# Patient Record
Sex: Male | Born: 1935 | Race: White | Hispanic: No | Smoking: Former smoker
Health system: Southern US, Community
[De-identification: ages and names within clinical notes are randomized; demographics above are authoritative.]

## PROBLEM LIST (undated history)

## (undated) DIAGNOSIS — G4700 Insomnia, unspecified: Secondary | ICD-10-CM

## (undated) DIAGNOSIS — F17200 Nicotine dependence, unspecified, uncomplicated: Secondary | ICD-10-CM

## (undated) DIAGNOSIS — R972 Elevated prostate specific antigen [PSA]: Secondary | ICD-10-CM

## (undated) DIAGNOSIS — J984 Other disorders of lung: Secondary | ICD-10-CM

## (undated) DIAGNOSIS — E785 Hyperlipidemia, unspecified: Secondary | ICD-10-CM

## (undated) DIAGNOSIS — C3490 Malignant neoplasm of unspecified part of unspecified bronchus or lung: Secondary | ICD-10-CM

## (undated) DIAGNOSIS — I10 Essential (primary) hypertension: Secondary | ICD-10-CM

## (undated) HISTORY — PX: PROSTATE SURGERY: SHX751

## (undated) HISTORY — DX: Hyperlipidemia, unspecified: E78.5

## (undated) HISTORY — DX: Insomnia, unspecified: G47.00

## (undated) HISTORY — DX: Elevated prostate specific antigen (PSA): R97.20

## (undated) HISTORY — PX: APPENDECTOMY: SHX54

## (undated) HISTORY — PX: LOBECTOMY: SHX5089

## (undated) HISTORY — DX: Malignant neoplasm of unspecified part of unspecified bronchus or lung: C34.90

## (undated) HISTORY — DX: Essential (primary) hypertension: I10

## (undated) HISTORY — DX: Nicotine dependence, unspecified, uncomplicated: F17.200

## (undated) HISTORY — DX: Other disorders of lung: J98.4

---

## 1972-08-17 HISTORY — PX: VEIN LIGATION AND STRIPPING: SHX2653

## 2003-04-11 ENCOUNTER — Emergency Department (HOSPITAL_COMMUNITY): Admission: EM | Admit: 2003-04-11 | Discharge: 2003-04-11 | Payer: Self-pay | Admitting: Emergency Medicine

## 2003-04-11 ENCOUNTER — Encounter: Payer: Self-pay | Admitting: Emergency Medicine

## 2006-12-01 ENCOUNTER — Ambulatory Visit: Payer: Self-pay | Admitting: Internal Medicine

## 2007-02-07 ENCOUNTER — Ambulatory Visit: Payer: Self-pay | Admitting: Internal Medicine

## 2007-02-07 LAB — CONVERTED CEMR LAB
ALT: 17 units/L (ref 0–40)
AST: 18 units/L (ref 0–37)
Albumin: 3.9 g/dL (ref 3.5–5.2)
Alkaline Phosphatase: 58 units/L (ref 39–117)
BUN: 20 mg/dL (ref 6–23)
Basophils Absolute: 0 10*3/uL (ref 0.0–0.1)
Calcium: 9.4 mg/dL (ref 8.4–10.5)
Chloride: 108 meq/L (ref 96–112)
Cholesterol: 159 mg/dL (ref 0–200)
Creatinine, Ser: 0.9 mg/dL (ref 0.4–1.5)
HDL: 29.1 mg/dL — ABNORMAL LOW (ref >39.0)
LDL Cholesterol: 109 mg/dL — ABNORMAL HIGH (ref 0–99)
MCHC: 34.8 g/dL (ref 30.0–36.0)
Monocytes Relative: 9.9 % (ref 3.0–11.0)
Potassium: 4.6 meq/L (ref 3.5–5.1)
RBC: 4.73 M/uL (ref 4.22–5.81)
RDW: 11.5 % (ref 11.5–14.6)
TSH: 2.06 microintl units/mL (ref 0.35–5.50)
Total Bilirubin: 1 mg/dL (ref 0.3–1.2)
Total CHOL/HDL Ratio: 5.5
VLDL: 21 mg/dL (ref 0–40)

## 2007-02-11 ENCOUNTER — Ambulatory Visit: Payer: Self-pay | Admitting: Internal Medicine

## 2007-04-08 ENCOUNTER — Ambulatory Visit: Payer: Self-pay | Admitting: Internal Medicine

## 2007-06-28 ENCOUNTER — Encounter: Payer: Self-pay | Admitting: *Deleted

## 2007-06-28 DIAGNOSIS — E785 Hyperlipidemia, unspecified: Secondary | ICD-10-CM | POA: Insufficient documentation

## 2007-06-28 DIAGNOSIS — I10 Essential (primary) hypertension: Secondary | ICD-10-CM

## 2007-06-28 DIAGNOSIS — Z9089 Acquired absence of other organs: Secondary | ICD-10-CM

## 2007-06-28 HISTORY — DX: Essential (primary) hypertension: I10

## 2007-06-28 HISTORY — DX: Hyperlipidemia, unspecified: E78.5

## 2007-08-18 DIAGNOSIS — C3490 Malignant neoplasm of unspecified part of unspecified bronchus or lung: Secondary | ICD-10-CM

## 2007-08-18 HISTORY — DX: Malignant neoplasm of unspecified part of unspecified bronchus or lung: C34.90

## 2007-11-09 ENCOUNTER — Ambulatory Visit: Payer: Self-pay | Admitting: Internal Medicine

## 2007-11-09 DIAGNOSIS — F17200 Nicotine dependence, unspecified, uncomplicated: Secondary | ICD-10-CM

## 2007-11-09 HISTORY — DX: Nicotine dependence, unspecified, uncomplicated: F17.200

## 2007-12-02 ENCOUNTER — Emergency Department (HOSPITAL_COMMUNITY): Admission: EM | Admit: 2007-12-02 | Discharge: 2007-12-02 | Payer: Self-pay | Admitting: Emergency Medicine

## 2007-12-07 ENCOUNTER — Ambulatory Visit: Payer: Self-pay | Admitting: Internal Medicine

## 2007-12-07 DIAGNOSIS — G4700 Insomnia, unspecified: Secondary | ICD-10-CM

## 2007-12-07 DIAGNOSIS — J984 Other disorders of lung: Secondary | ICD-10-CM

## 2007-12-07 HISTORY — DX: Insomnia, unspecified: G47.00

## 2007-12-07 HISTORY — DX: Other disorders of lung: J98.4

## 2007-12-14 ENCOUNTER — Ambulatory Visit (HOSPITAL_COMMUNITY): Admission: RE | Admit: 2007-12-14 | Discharge: 2007-12-14 | Payer: Self-pay | Admitting: Internal Medicine

## 2007-12-14 ENCOUNTER — Telehealth: Payer: Self-pay | Admitting: Internal Medicine

## 2008-01-02 ENCOUNTER — Ambulatory Visit: Payer: Self-pay | Admitting: Emergency Medicine

## 2008-01-02 ENCOUNTER — Encounter: Payer: Self-pay | Admitting: Internal Medicine

## 2008-01-05 ENCOUNTER — Encounter: Payer: Self-pay | Admitting: Internal Medicine

## 2008-01-05 ENCOUNTER — Ambulatory Visit: Payer: Self-pay | Admitting: Thoracic Surgery

## 2008-02-23 ENCOUNTER — Inpatient Hospital Stay (HOSPITAL_COMMUNITY): Admission: RE | Admit: 2008-02-23 | Discharge: 2008-02-28 | Payer: Self-pay | Admitting: Thoracic Surgery

## 2008-02-23 ENCOUNTER — Encounter: Payer: Self-pay | Admitting: Thoracic Surgery

## 2008-02-23 ENCOUNTER — Ambulatory Visit: Payer: Self-pay | Admitting: Thoracic Surgery

## 2008-03-06 ENCOUNTER — Ambulatory Visit: Payer: Self-pay | Admitting: Thoracic Surgery

## 2008-03-06 ENCOUNTER — Encounter: Admission: RE | Admit: 2008-03-06 | Discharge: 2008-03-06 | Payer: Self-pay | Admitting: Thoracic Surgery

## 2008-03-06 ENCOUNTER — Encounter: Payer: Self-pay | Admitting: Internal Medicine

## 2008-03-28 ENCOUNTER — Ambulatory Visit: Payer: Self-pay | Admitting: Thoracic Surgery

## 2008-03-28 ENCOUNTER — Encounter: Admission: RE | Admit: 2008-03-28 | Discharge: 2008-03-28 | Payer: Self-pay | Admitting: Thoracic Surgery

## 2008-05-09 ENCOUNTER — Ambulatory Visit: Payer: Self-pay | Admitting: Thoracic Surgery

## 2008-05-09 ENCOUNTER — Encounter: Admission: RE | Admit: 2008-05-09 | Discharge: 2008-05-09 | Payer: Self-pay | Admitting: Thoracic Surgery

## 2008-05-21 ENCOUNTER — Ambulatory Visit: Payer: Self-pay | Admitting: Internal Medicine

## 2008-05-21 DIAGNOSIS — J069 Acute upper respiratory infection, unspecified: Secondary | ICD-10-CM | POA: Insufficient documentation

## 2008-08-01 ENCOUNTER — Encounter: Admission: RE | Admit: 2008-08-01 | Discharge: 2008-08-01 | Payer: Self-pay | Admitting: Thoracic Surgery

## 2008-08-01 ENCOUNTER — Ambulatory Visit: Payer: Self-pay | Admitting: Thoracic Surgery

## 2008-08-22 ENCOUNTER — Ambulatory Visit: Payer: Self-pay | Admitting: Internal Medicine

## 2008-10-03 ENCOUNTER — Ambulatory Visit: Payer: Self-pay | Admitting: Internal Medicine

## 2009-01-30 ENCOUNTER — Ambulatory Visit: Payer: Self-pay | Admitting: Internal Medicine

## 2009-03-06 ENCOUNTER — Ambulatory Visit: Payer: Self-pay | Admitting: Thoracic Surgery

## 2009-03-06 ENCOUNTER — Encounter: Admission: RE | Admit: 2009-03-06 | Discharge: 2009-03-06 | Payer: Self-pay | Admitting: Thoracic Surgery

## 2009-07-02 IMAGING — CR DG CHEST 2V
2 series · 2 of 2 positions shown · non-contrast
Comparison: PA and lateral chest 12/01/2006.

CLINICAL DATA: Palpitations.  History of tobacco use.

CHEST - 2 VIEW

[w chest pa]
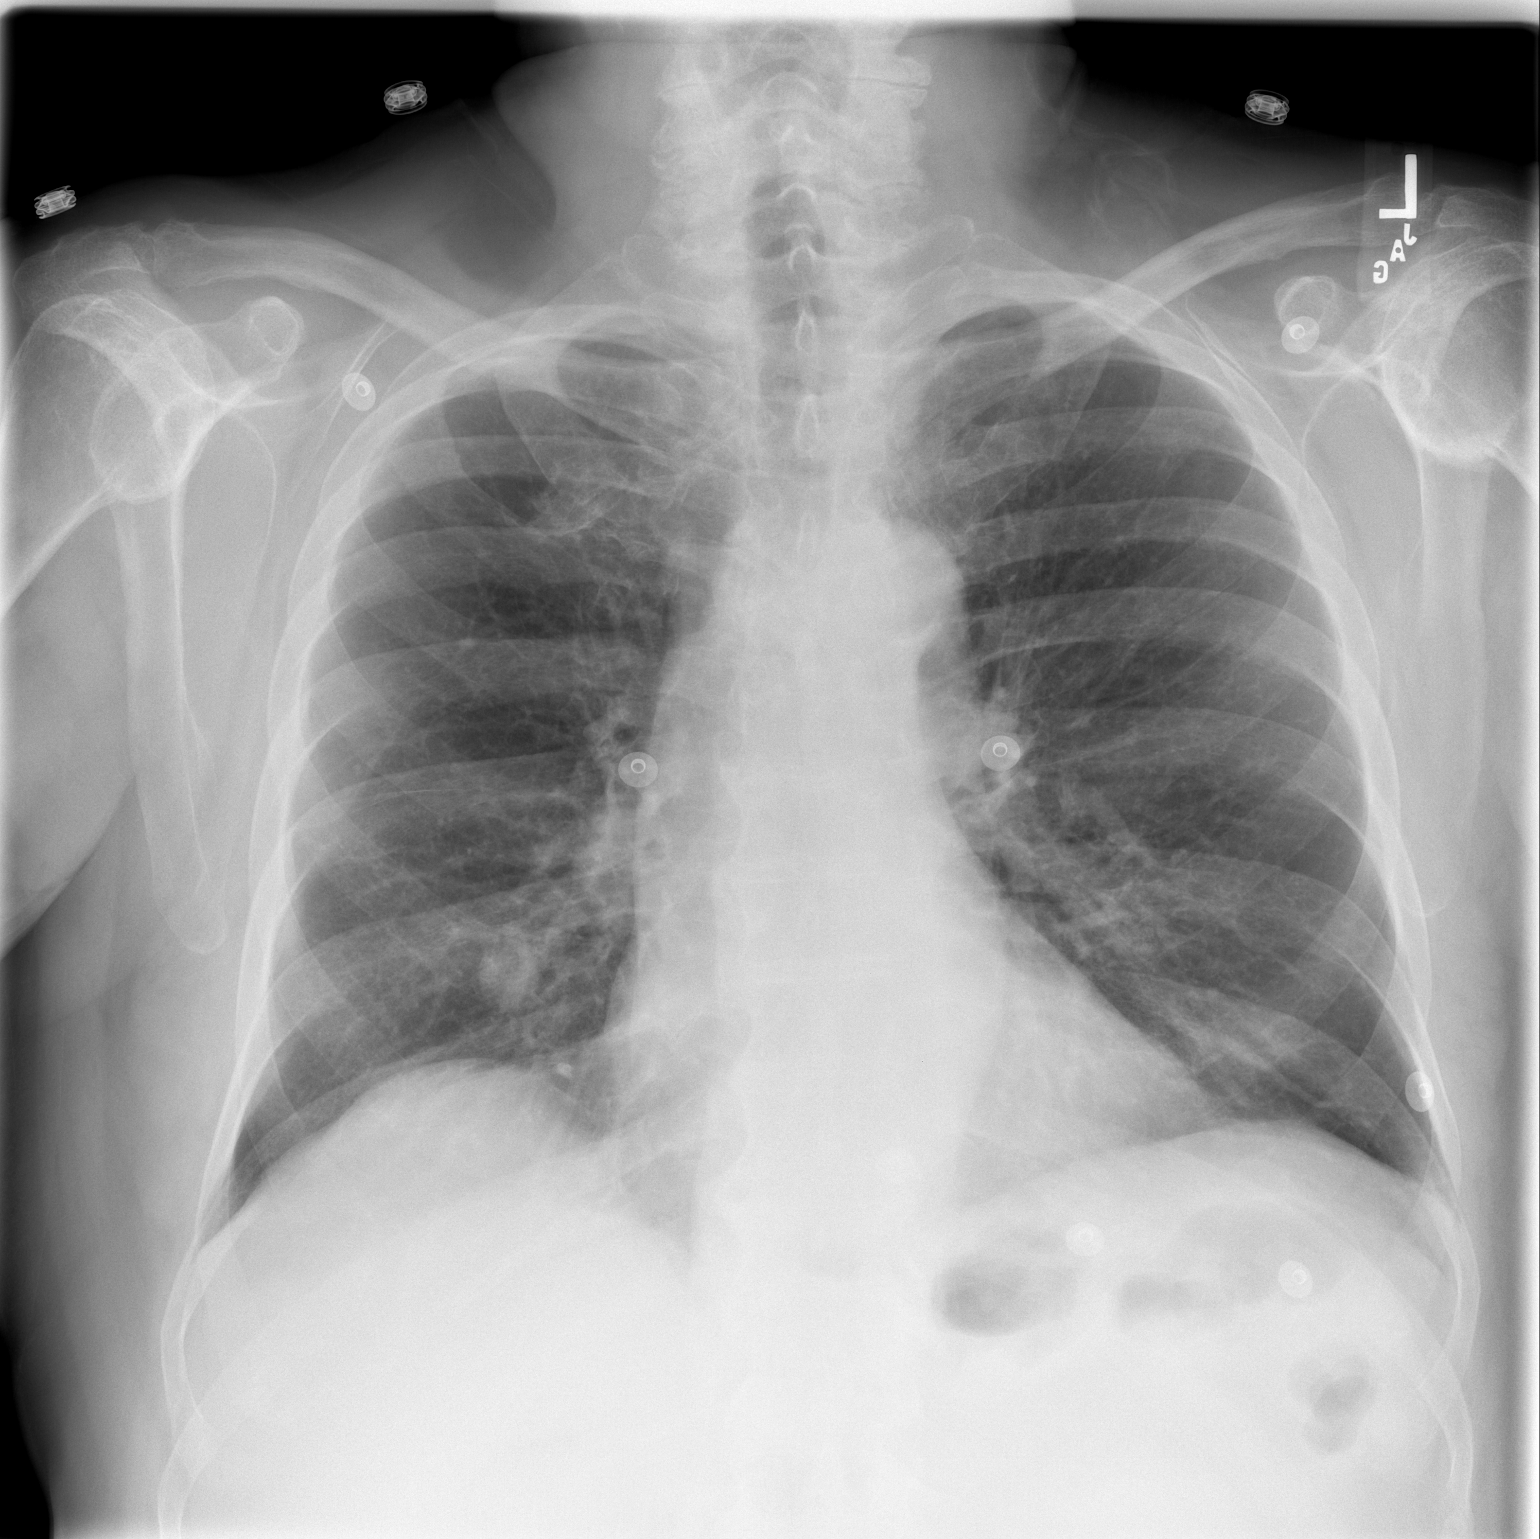

[w chest lat]
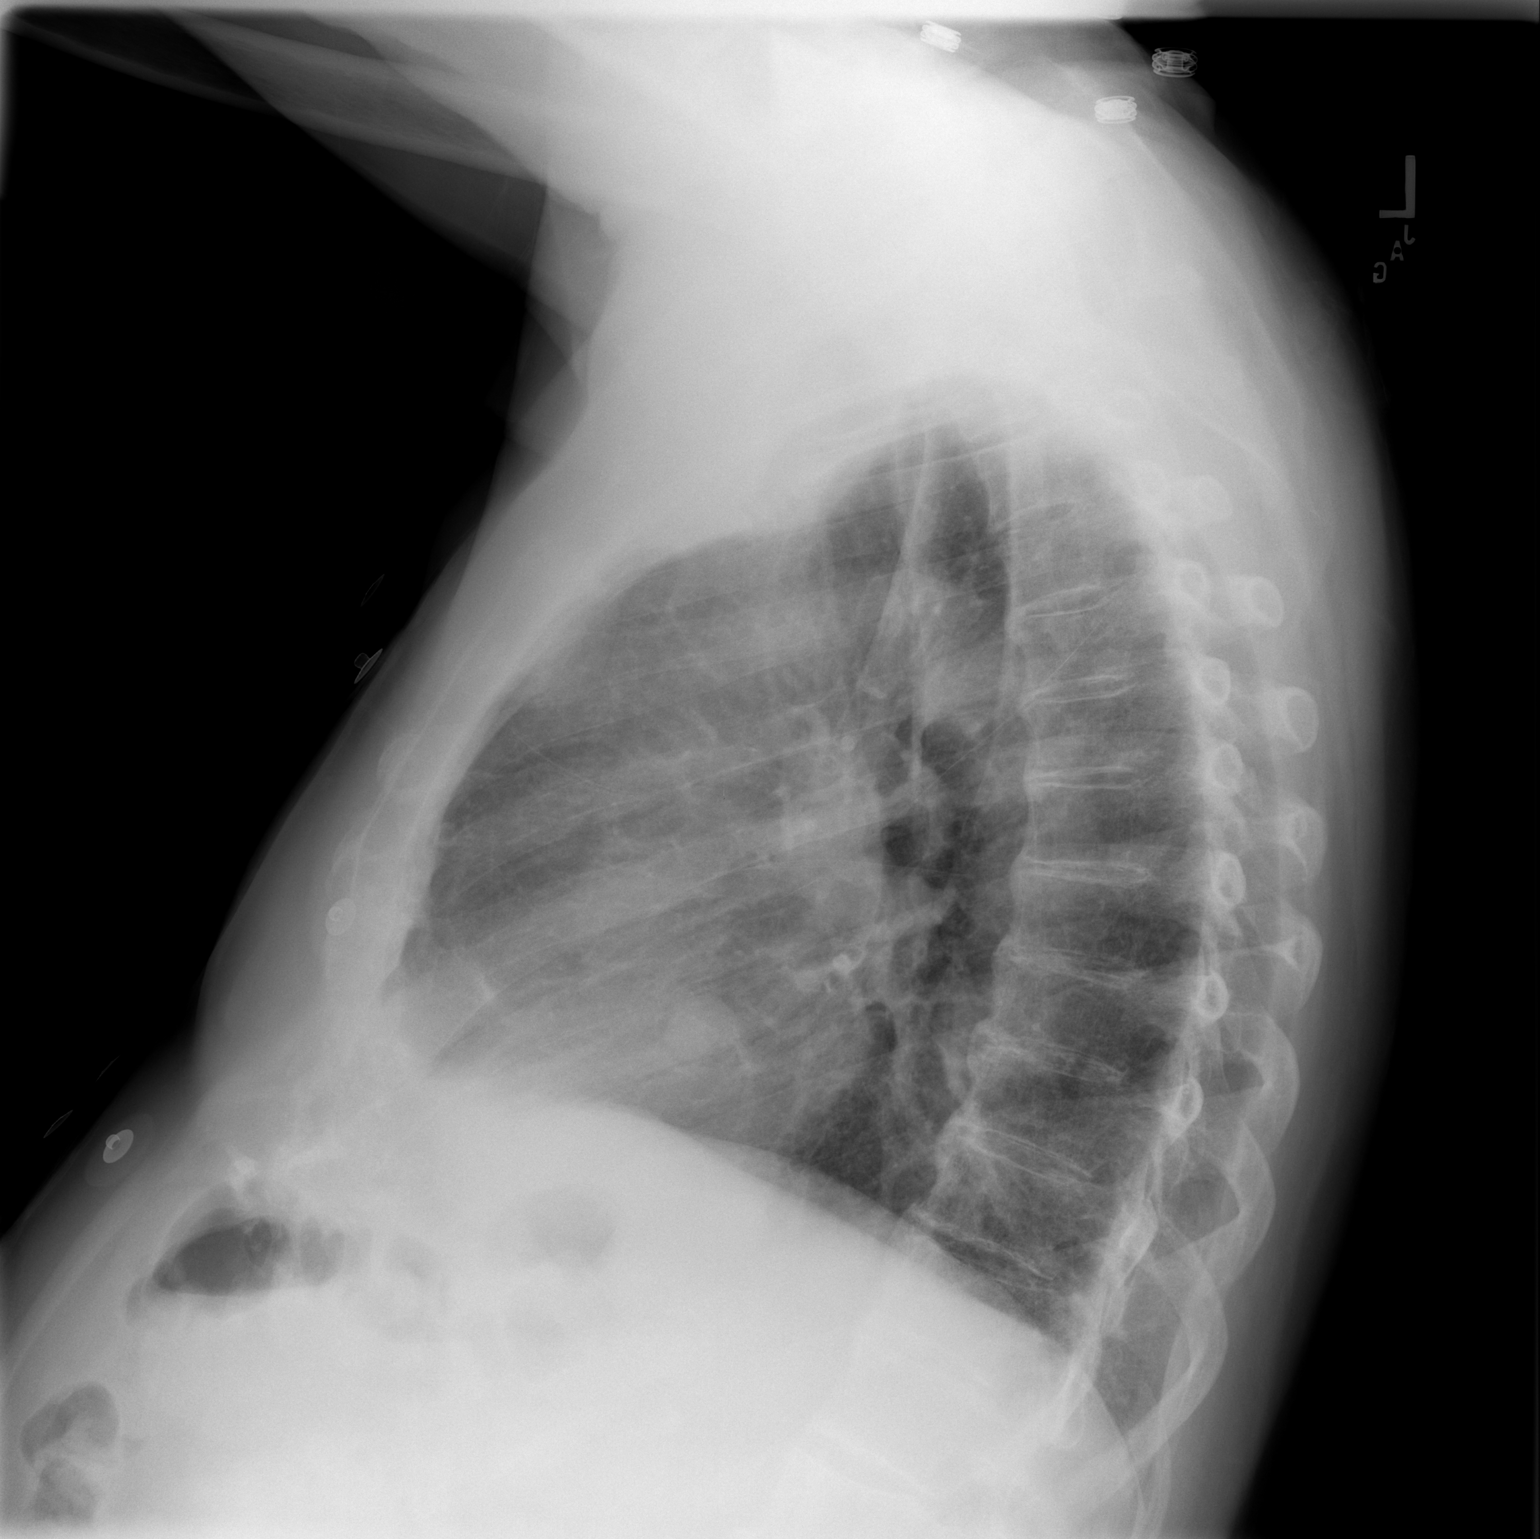

[2 of 2 positions shown; findings below may reference images not displayed]

FINDINGS: There is a new nodule in the right middle lobe measuring
approximate 2.3 x 1.5 by 2.0 cm in diameter.  Lungs are otherwise
clear.  Heart size normal.
IMPRESSION: New right middle lobe pulmonary nodule.  Recommend chest CT scan
for further evaluation.

## 2009-07-26 ENCOUNTER — Ambulatory Visit: Payer: Self-pay | Admitting: Internal Medicine

## 2009-07-31 DIAGNOSIS — R972 Elevated prostate specific antigen [PSA]: Secondary | ICD-10-CM

## 2009-07-31 HISTORY — DX: Elevated prostate specific antigen (PSA): R97.20

## 2009-08-30 ENCOUNTER — Encounter: Payer: Self-pay | Admitting: Internal Medicine

## 2009-09-26 IMAGING — CR DG CHEST 1V PORT
1 series · 1 of 1 positions shown · non-contrast
Comparison: 02/25/2008

CLINICAL DATA: Lung lesion

PORTABLE CHEST - 1 VIEW

[AP]
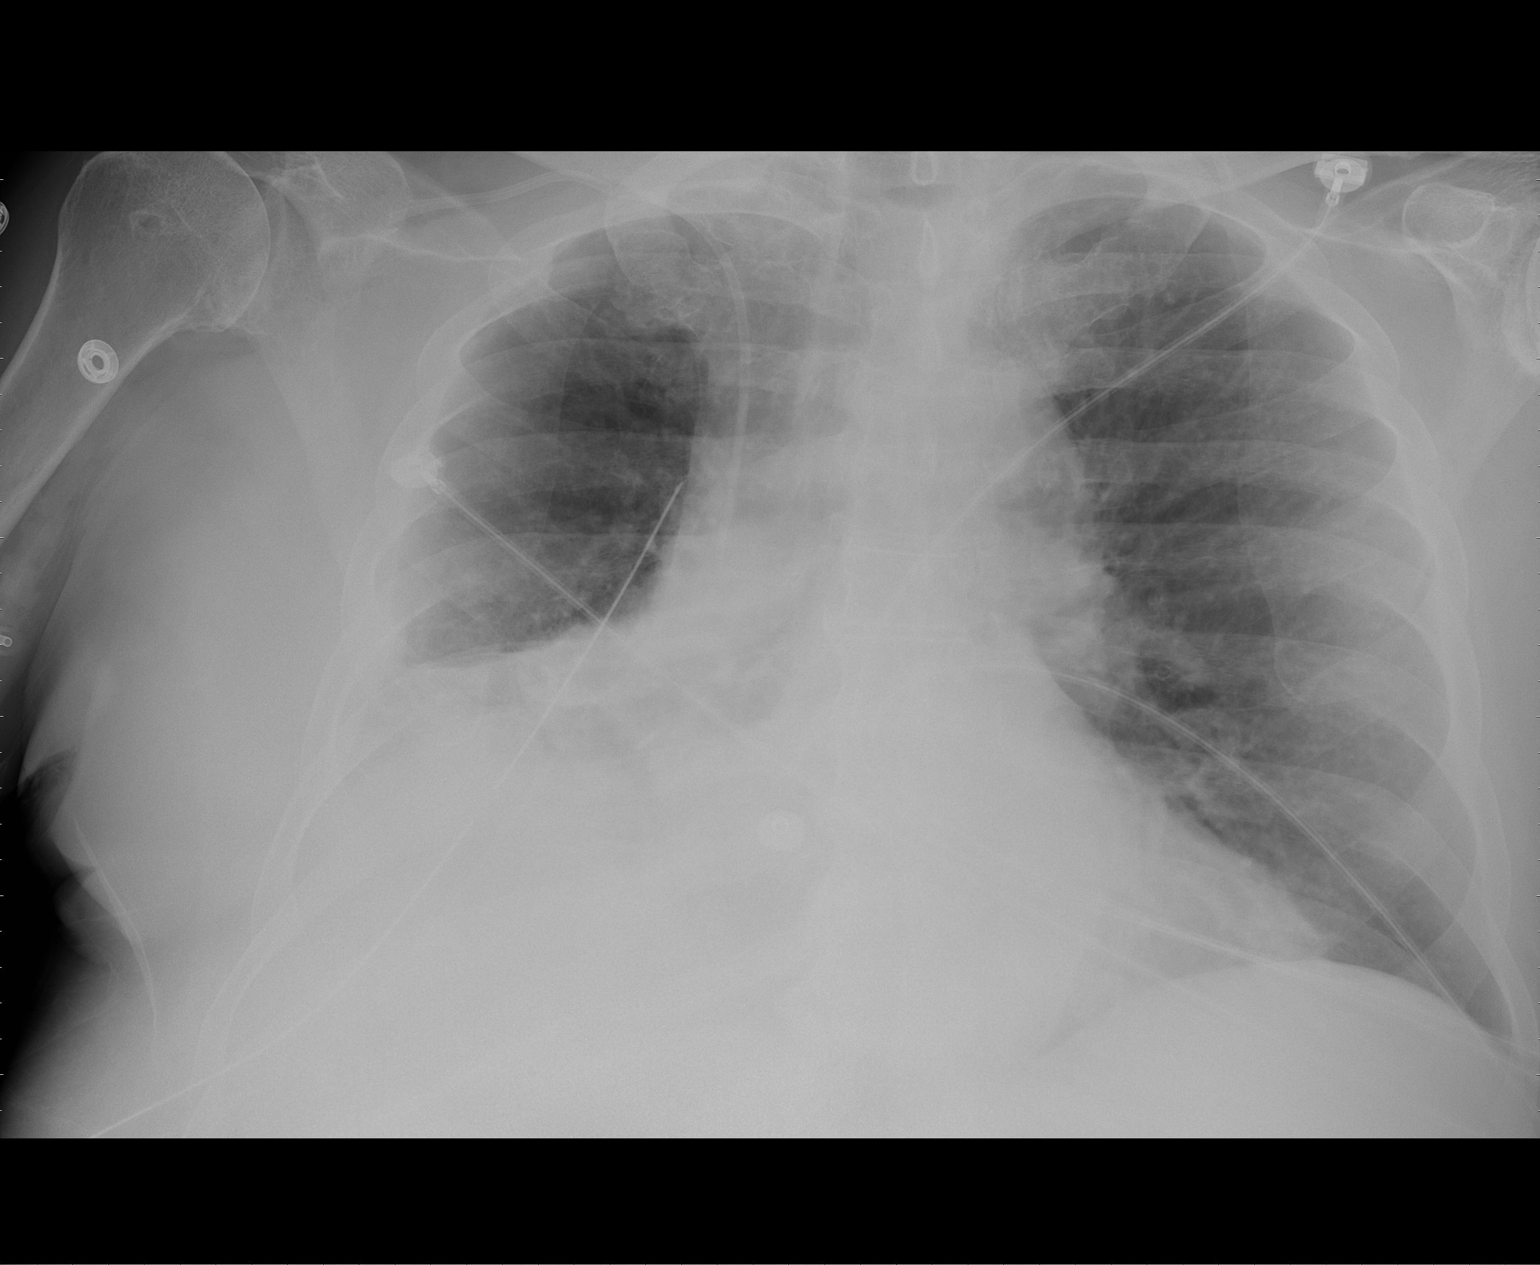

[1 of 1 positions shown; findings below may reference images not displayed]

FINDINGS: The right subclavian central venous catheter and right
chest tube are stable.  No pneumothorax.  The left lung is clear.
Right basilar atelectasis and right effusion have enlarged.
IMPRESSION: Increasing right effusion and basilar atelectasis.  No
pneumothorax.

## 2009-09-27 ENCOUNTER — Encounter: Admission: RE | Admit: 2009-09-27 | Discharge: 2009-09-27 | Payer: Self-pay | Admitting: Thoracic Surgery

## 2009-09-27 ENCOUNTER — Encounter: Payer: Self-pay | Admitting: Internal Medicine

## 2009-09-27 ENCOUNTER — Ambulatory Visit: Payer: Self-pay | Admitting: Thoracic Surgery

## 2009-09-28 IMAGING — CR DG CHEST 2V
2 series · 2 of 2 positions shown · non-contrast
Comparison: 02/27/2008

CLINICAL DATA: Postop for lung surgery

CHEST - 2 VIEW

[w chest pa]
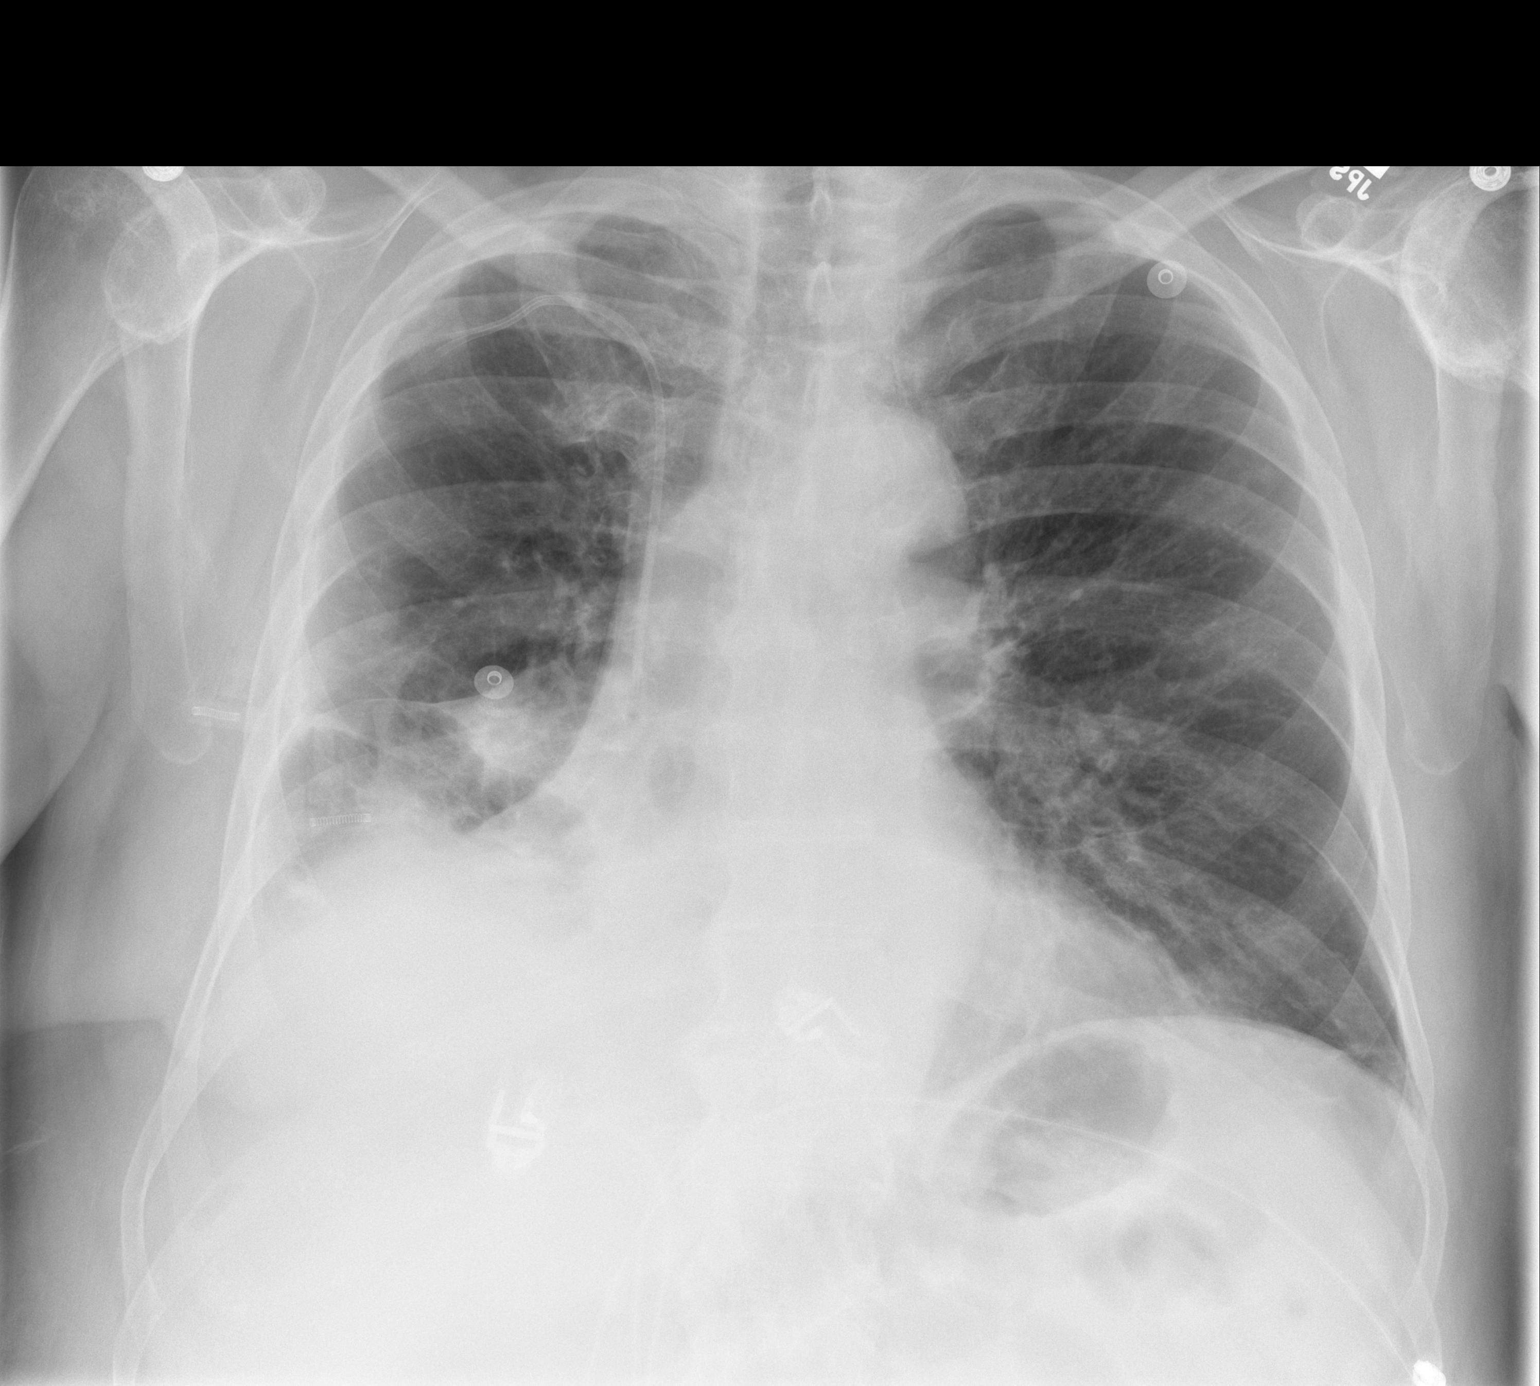

[w chest lat]
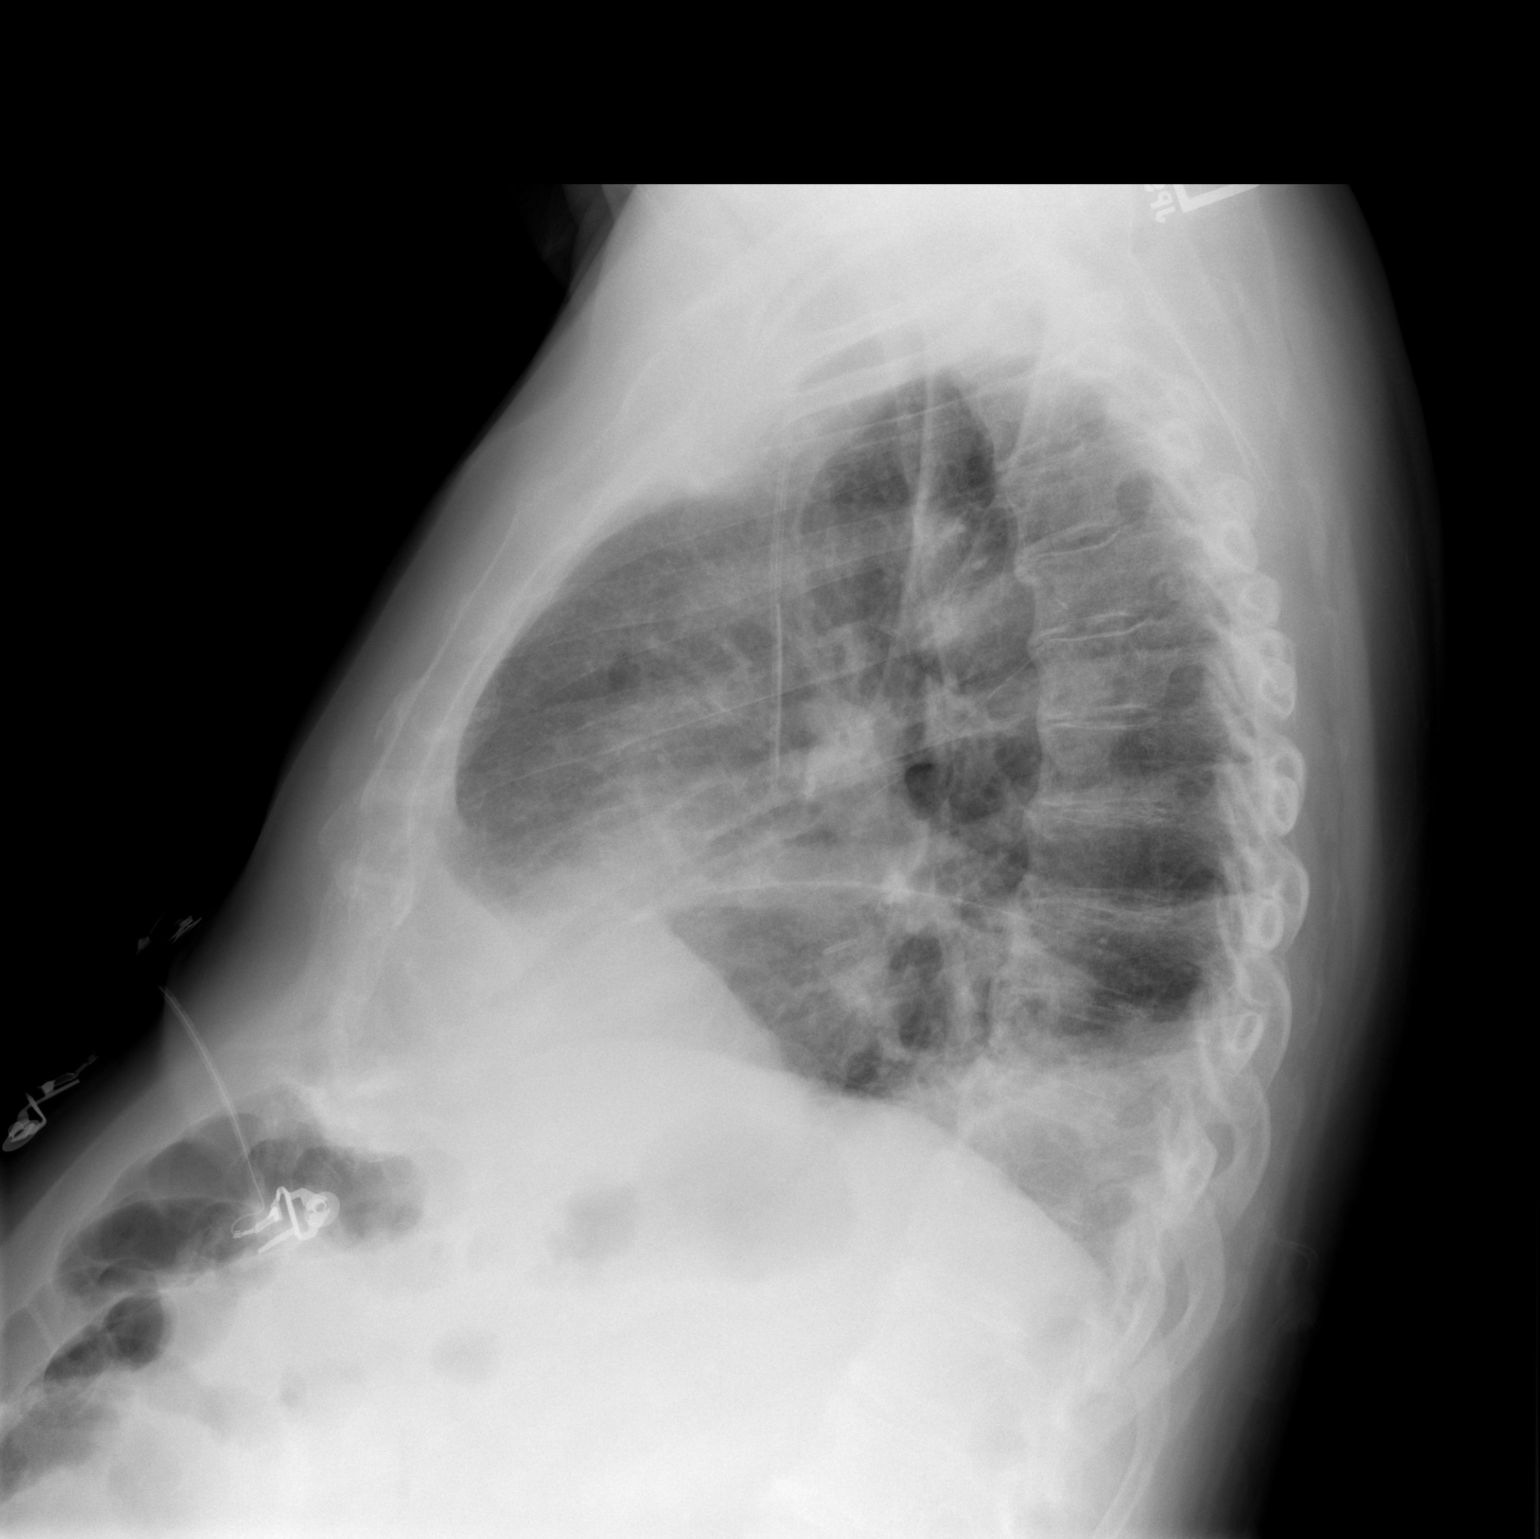

[2 of 2 positions shown; findings below may reference images not displayed]

FINDINGS: Right basilar pleural and parenchymal densities stable.
Left lung clear.  No pneumothorax.  No interval change.
IMPRESSION: Postoperative changes at the right base appears stable - no
significant change.

## 2009-10-18 ENCOUNTER — Encounter: Payer: Self-pay | Admitting: Internal Medicine

## 2009-12-25 ENCOUNTER — Telehealth: Payer: Self-pay | Admitting: Internal Medicine

## 2010-01-24 ENCOUNTER — Ambulatory Visit: Payer: Self-pay | Admitting: Internal Medicine

## 2010-04-09 ENCOUNTER — Encounter: Admission: RE | Admit: 2010-04-09 | Discharge: 2010-04-09 | Payer: Self-pay | Admitting: Thoracic Surgery

## 2010-04-09 ENCOUNTER — Ambulatory Visit: Payer: Self-pay | Admitting: Thoracic Surgery

## 2010-07-30 ENCOUNTER — Ambulatory Visit: Payer: Self-pay | Admitting: Internal Medicine

## 2010-07-30 ENCOUNTER — Encounter: Payer: Self-pay | Admitting: Internal Medicine

## 2010-07-30 LAB — CONVERTED CEMR LAB
Alkaline Phosphatase: 62 units/L (ref 39–117)
Basophils Relative: 0.7 % (ref 0.0–3.0)
Bilirubin, Direct: 0.3 mg/dL (ref 0.0–0.3)
Calcium: 9.5 mg/dL (ref 8.4–10.5)
Creatinine, Ser: 0.8 mg/dL (ref 0.4–1.5)
Eosinophils Absolute: 0.1 10*3/uL (ref 0.0–0.7)
Eosinophils Relative: 0.7 % (ref 0.0–5.0)
HDL: 29.5 mg/dL — ABNORMAL LOW (ref >39.00)
LDL Cholesterol: 45 mg/dL (ref 0–99)
Lymphocytes Relative: 13.5 % (ref 12.0–46.0)
MCHC: 34.6 g/dL (ref 30.0–36.0)
Neutrophils Relative %: 77.2 % — ABNORMAL HIGH (ref 43.0–77.0)
Platelets: 209 10*3/uL (ref 150.0–400.0)
RBC: 4.74 M/uL (ref 4.22–5.81)
Total Bilirubin: 1.8 mg/dL — ABNORMAL HIGH (ref 0.3–1.2)
Total CHOL/HDL Ratio: 3
Total Protein: 7.4 g/dL (ref 6.0–8.3)
Triglycerides: 123 mg/dL (ref 0.0–149.0)
VLDL: 24.6 mg/dL (ref 0.0–40.0)
WBC: 9.8 10*3/uL (ref 4.5–10.5)

## 2010-08-15 ENCOUNTER — Telehealth: Payer: Self-pay

## 2010-09-14 LAB — CONVERTED CEMR LAB
AST: 26 units/L (ref 0–37)
Alkaline Phosphatase: 62 units/L (ref 39–117)
Basophils Absolute: 0 10*3/uL (ref 0.0–0.1)
Bilirubin, Direct: 0.1 mg/dL (ref 0.0–0.3)
Calcium: 9.3 mg/dL (ref 8.4–10.5)
GFR calc non Af Amer: 87.41 mL/min (ref >60)
Glucose, Bld: 111 mg/dL — ABNORMAL HIGH (ref 70–99)
HDL: 30 mg/dL — ABNORMAL LOW (ref >39.00)
Hemoglobin: 15.5 g/dL (ref 13.0–17.0)
Lymphocytes Relative: 16.2 % (ref 12.0–46.0)
Monocytes Relative: 7.8 % (ref 3.0–12.0)
Neutro Abs: 6.3 10*3/uL (ref 1.4–7.7)
Platelets: 189 10*3/uL (ref 150.0–400.0)
RDW: 11.4 % — ABNORMAL LOW (ref 11.5–14.6)
Sodium: 141 meq/L (ref 135–145)
Total Bilirubin: 1.6 mg/dL — ABNORMAL HIGH (ref 0.3–1.2)
VLDL: 25 mg/dL (ref 0.0–40.0)

## 2010-09-16 NOTE — Assessment & Plan Note (Signed)
Summary: 6 month rov/njr   Vital Signs:  Patient profile:   77 year old male Weight:      249 pounds Temp:     97.9 degrees F BP sitting:   120 / 80  (left arm) Cuff size:   large  Vitals Entered By: Duard Brady LPN (January 24, 2010 8:22 AM) CC: 6 mos rov - doing well Is Patient Diabetic? No   CC:  6 mos rov - doing well.  History of Present Illness: 16  old patient who is seen today for follow-up.  He has a history of hypertension and dyslipidemia.  since his last visit here.  He is unevaluated for an elevated PSA.  He has a history of ongoing tobacco use.  No concerns or complaints today.  There has been some weight gain since his last visit here.  His blood pressure remains nicely controlled.  He remains on simvastatin 40 mg daily.  His last total cholesterol 6 months ago was less than 100  Preventive Screening-Counseling & Management  Alcohol-Tobacco     Smoking Status: quit     Smoking Cessation Counseling: yes  Allergies: 1)  ! Niaspan (Niacin (Antihyperlipidemic))  Past History:  Past Medical History: TOBACCO ABUSE HYPERLIPIDEMIA (ICD-272.4) HYPERTENSION (ICD-401.9) RML Pulmonary Nodule 1.7 x 1.6 cm 12/02/07. secondary to carcinoid tumor history of palpitations elevated PSA  Past Surgical History: Appendectomy S/P Vein stripping right Vats, minithoracotomy with right middle lobe, lobectomy July 2009 prostate biopsy 2011  Family History: Reviewed history from 11/09/2007 and no changes required. Mother apparently died of complications of coma. Father deceased at age 43, cause unknown. Denies any family history of cancer, heart disease.   Review of Systems       The patient complains of weight gain.  The patient denies anorexia, fever, weight loss, vision loss, decreased hearing, hoarseness, chest pain, syncope, dyspnea on exertion, peripheral edema, prolonged cough, headaches, hemoptysis, abdominal pain, melena, hematochezia, severe indigestion/heartburn,  hematuria, incontinence, genital sores, muscle weakness, suspicious skin lesions, transient blindness, difficulty walking, depression, unusual weight change, abnormal bleeding, enlarged lymph nodes, angioedema, breast masses, and testicular masses.    Physical Exam  General:  overweight-appearing.  124/80overweight-appearing.   Head:  Normocephalic and atraumatic without obvious abnormalities. No apparent alopecia or balding. Mouth:  Oral mucosa and oropharynx without lesions or exudates.  Teeth in good repair. Neck:  No deformities, masses, or tenderness noted. Lungs:  Normal respiratory effort, chest expands symmetrically. Lungs are clear to auscultation, no crackles or wheezes. Heart:  Normal rate and regular rhythm. S1 and S2 normal without gallop, murmur, click, rub or other extra sounds. Abdomen:  Bowel sounds positive,abdomen soft and non-tender without masses, organomegaly or hernias noted. Msk:  No deformity or scoliosis noted of thoracic or lumbar spine.   Pulses:  R and L carotid,radial,femoral,dorsalis pedis and posterior tibial pulses are full and equal bilaterally Extremities:  No clubbing, cyanosis, edema, or deformity noted with normal full range of motion of all joints.     Impression & Recommendations:  Problem # 1:  ELEVATED PROSTATE SPECIFIC ANTIGEN (ICD-790.93)  Problem # 2:  TOBACCO ABUSE (ICD-305.1)  Problem # 3:  HYPERLIPIDEMIA (ICD-272.4)  His updated medication list for this problem includes:    Simvastatin 40 Mg Tabs (Simvastatin) ..... One by mouth qpm    His updated medication list for this problem includes:    Simvastatin 40 Mg Tabs (Simvastatin) ..... One by mouth qpm  Orders: Prescription Created Electronically [phone])  Problem # 4:  HYPERTENSION (ICD-401.9)  His updated medication list for this problem includes:    Atenolol 100 Mg Tabs (Atenolol) .Marland Kitchen... Take 1 tablet by mouth once a day    Lisinopril 40 Mg Tabs (Lisinopril) ..... Qd    His  updated medication list for this problem includes:    Atenolol 100 Mg Tabs (Atenolol) .Marland Kitchen... Take 1 tablet by mouth once a day    Lisinopril 40 Mg Tabs (Lisinopril) ..... Qd  Orders: Prescription Created Electronically [phone])  Complete Medication List: 1)  Atenolol 100 Mg Tabs (Atenolol) .... Take 1 tablet by mouth once a day 2)  Simvastatin 40 Mg Tabs (Simvastatin) .... One by mouth qpm 3)  Tylenol  .... As needed 4)  Lisinopril 40 Mg Tabs (Lisinopril) .... Qd  Patient Instructions: 1)  Please schedule a follow-up appointment in 6 months. 2)  Advised not to eat any food or drink any liquids after 10 PM the night before your procedure. 3)  Limit your Sodium (Salt). 4)  Tobacco is very bad for your health and your loved ones! You Should stop smoking!. 5)  It is important that you exercise regularly at least 20 minutes 5 times a week. If you develop chest pain, have severe difficulty breathing, or feel very tired , stop exercising immediately and seek medical attention. 6)  You need to lose weight. Consider a lower calorie diet and regular exercise.  Prescriptions: LISINOPRIL 40 MG TABS (LISINOPRIL) qd  #90 x 4   Entered and Authorized by:   Gordy Savers  MD   Signed by:   Gordy Savers  MD on 01/24/2010   Method used:   Electronically to        [facility]  [phone]* (retail)       [address]       [place], [place]  [address]       Ph: [phone] or [phone]       Fax: [phone]   RxID:   [id number] SIMVASTATIN 40 MG  TABS (SIMVASTATIN) one by mouth qpm  #90 x 6   Entered and Authorized by:   Gordy Savers  MD   Signed by:   Gordy Savers  MD on 01/24/2010   Method used:   Electronically to        [facility]  [phone]* (retail)       [address]       [place], [place]  [address]       Ph: [phone] or [phone]       Fax: [phone]   RxID:    [id number] ATENOLOL 100 MG  TABS (ATENOLOL) Take 1 tablet by mouth once a day  #90 x 5   Entered and Authorized by:   Gordy Savers  MD   Signed by:   Gordy Savers  MD on 01/24/2010   Method used:   Electronically to        [facility]  [phone]* (retail)       [address]       [place], [place]  [address]       Ph: [phone] or [phone]       Fax: [phone]   RxID:   [id number]

## 2010-09-16 NOTE — Consult Note (Signed)
Summary: [facility]   Imported By: Maryln Gottron 09/05/2009 10:13:37  _____________________________________________________________________  External Attachment:    Type:   Image     Comment:   External Document

## 2010-09-16 NOTE — Letter (Signed)
Summary: [facility]   Imported By: Maryln Gottron 10/22/2009 13:11:05  _____________________________________________________________________  External Attachment:    Type:   Image     Comment:   External Document

## 2010-09-16 NOTE — Progress Notes (Signed)
Summary: benicar to lisinopril       New/Updated Medications: LISINOPRIL 40 MG TABS (LISINOPRIL) qd Prescriptions: LISINOPRIL 40 MG TABS (LISINOPRIL) qd  #90 x 4   Entered by:   Duard Brady LPN   Authorized by:   Gordy Savers  MD   Signed by:   Duard Brady LPN on 16/05/9603   Method used:   Faxed to ...       [facility]  [address]  [phone]* (retail)       [address]       [place], [place]  [address]       Ph: [phone] or [phone]       Fax: [phone]   RxID:   [id number]  pharmacy request to change med to something covered by insurance - change to med list - removed benicar - order for lisinopril . KIK

## 2010-09-16 NOTE — Letter (Signed)
Summary: [facility]   Imported By: Lanelle Bal 10/15/2009 12:25:33  _____________________________________________________________________  External Attachment:    Type:   Image     Comment:   External Document

## 2010-09-18 NOTE — Assessment & Plan Note (Signed)
Summary: emp-will fast//ccm   Vital Signs:  Patient profile:   77 year old male Height:      71.5 inches Weight:      249 pounds BMI:     34.37 Temp:     97.6 degrees F oral BP sitting:   130 / 80  (left arm) Cuff size:   regular  Vitals Entered By: Duard Brady LPN (July 30, 2010 8:29 AM) CC: cpx---doing ok  Is Patient Diabetic? No   CC:  cpx---doing ok .  History of Present Illness: 30 -year-old patient was seen today for a comprehensive evaluation.  Medical problems include BPH and elevated PSA.  He is followed by urology and did have a prostate biopsy performed.  This past spring.  He has hypertension and dyslipidemia  Here for Medicare AWV:  1.   Risk factors based on Past M, S, F history:cardiovascular risk factors include hypertension, and dyslipidemia 2.   Physical Activities: no activity restrictions, although fairly sedentary 3.   Depression/mood: no  history depression, or mood disorder 4.   Hearing: mildly impaired 5.   ADL's: independent in all aspects of daily living.  Lives independently 6.   Fall Risk: low 7.   Home Safety: no problems identified 8.   Height, weight, &visual acuity:height and weight stable.  No difficulty with visual acuity 9.   Counseling: heart healthy diet, exercise, weight loss.  All encouraged 10.   Labs ordered based on risk factors: laboratory profile, including lipid panel will be reviewed 11.           Referral Coordination- follow-up urology in the spring 12.           Care Plan- weight loss, heart healthy diet 13.            Cognitive Assessment- alert and oriented, with normal affect.  No cognitive dysfunction.  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   Allergies: 1)  ! Niaspan (Niacin (Antihyperlipidemic))  Past History:  Past Medical History: Reviewed history from 01/24/2010 and no changes required. TOBACCO ABUSE HYPERLIPIDEMIA (ICD-272.4) HYPERTENSION (ICD-401.9) RML Pulmonary Nodule 1.7 x 1.6 cm  12/02/07. secondary to carcinoid tumor history of palpitations elevated PSA  Past Surgical History: Reviewed history from 01/24/2010 and no changes required. Appendectomy S/P Vein stripping right Vats, minithoracotomy with right middle lobe, lobectomy July 2009 prostate biopsy 2011  Family History: Reviewed history from 11/09/2007 and no changes required. Mother apparently died of complications of coma. Father deceased at age 8, cause unknown. Denies any family history of cancer, heart disease.   Social History: Reviewed history from 07/26/2009 and no changes required. Patient is widowed, his wife passed away 7 years ago. He  lives alone. He is retired from working in the [employer]. He has 3 children, only 1 daughter that lives in the area in [place], a son that lives in [place], and a son that lives in [place].  Alcohol use-no Current Smoker 1 ppd >50 pk years- d/c tobacco 4-09  Review of Systems  The patient denies anorexia, fever, weight loss, weight gain, vision loss, decreased hearing, hoarseness, chest pain, syncope, dyspnea on exertion, peripheral edema, prolonged cough, headaches, hemoptysis, abdominal pain, melena, hematochezia, severe indigestion/heartburn, hematuria, incontinence, genital sores, muscle weakness, suspicious skin lesions, transient blindness, difficulty walking, depression, unusual weight change, abnormal bleeding, enlarged lymph nodes, angioedema, breast masses, and testicular masses.    Physical Exam  General:  overweight-appearing.  120/76overweight-appearing.   Head:  Normocephalic and atraumatic without obvious abnormalities.  No apparent alopecia or balding. Eyes:  No corneal or conjunctival inflammation noted. EOMI. Perrla. Funduscopic exam benign, without hemorrhages, exudates or papilledema. Vision grossly normal. Ears:  External ear exam shows no significant lesions or deformities.  Otoscopic examination reveals clear canals, tympanic  membranes are intact bilaterally without bulging, retraction, inflammation or discharge. Hearing is grossly normal bilaterally. Nose:  External nasal examination shows no deformity or inflammation. Nasal mucosa are pink and moist without lesions or exudates. Mouth:  Oral mucosa and oropharynx without lesions or exudates.  Teeth in good repair. Neck:  No deformities, masses, or tenderness noted. Chest Wall:  No deformities, masses, tenderness or gynecomastia noted. Breasts:  No masses or gynecomastia noted Lungs:  Normal respiratory effort, chest expands symmetrically. Lungs are clear to auscultation, no crackles or wheezes. Heart:  Normal rate and regular rhythm. S1 and S2 normal without gallop, murmur, click, rub or other extra sounds. Abdomen:  Bowel sounds positive,abdomen soft and non-tender without masses, organomegaly or hernias noted. right lower quadrant scar Rectal:  per urology Prostate:  per urology Msk:  No deformity or scoliosis noted of thoracic or lumbar spine.   Pulses:  pedal pulses are full Extremities:  trace left pedal edema and trace right pedal edema.  trace left pedal edema.   Neurologic:  No cranial nerve deficits noted. Station and gait are normal. Plantar reflexes are down-going bilaterally. DTRs are symmetrical throughout. Sensory, motor and coordinative functions appear intact. Skin:  Intact without suspicious lesions or rashes Cervical Nodes:  No lymphadenopathy noted Axillary Nodes:  No palpable lymphadenopathy Inguinal Nodes:  No significant adenopathy Psych:  Cognition and judgment appear intact. Alert and cooperative with normal attention span and concentration. No apparent delusions, illusions, hallucinations   Impression & Recommendations:  Problem # 1:  PREVENTIVE HEALTH CARE (ICD-V70.0)  Orders: Medicare -1st Annual Wellness Visit [phone])  Problem # 2:  HYPERLIPIDEMIA (ICD-272.4)  His updated medication list for this problem includes:     Simvastatin 40 Mg Tabs (Simvastatin) ..... One by mouth qpm  His updated medication list for this problem includes:    Simvastatin 40 Mg Tabs (Simvastatin) ..... One by mouth qpm  Orders: Venipuncture (98119) TLB-Lipid Panel (80061-LIPID) TLB-BMP (Basic Metabolic Panel-BMET) (80048-METABOL) TLB-CBC Platelet - w/Differential (85025-CBCD) TLB-Hepatic/Liver Function Pnl (80076-HEPATIC) TLB-TSH (Thyroid Stimulating Hormone) (84443-TSH)  Problem # 3:  HYPERTENSION (ICD-401.9)  His updated medication list for this problem includes:    Atenolol 100 Mg Tabs (Atenolol) .Marland Kitchen... Take 1 tablet by mouth once a day    Lisinopril 40 Mg Tabs (Lisinopril) ..... Qd  Orders: EKG w/ Interpretation (93000) Venipuncture (14782) TLB-BMP (Basic Metabolic Panel-BMET) (80048-METABOL) TLB-CBC Platelet - w/Differential (85025-CBCD) TLB-Hepatic/Liver Function Pnl (80076-HEPATIC) TLB-TSH (Thyroid Stimulating Hormone) (84443-TSH)  Complete Medication List: 1)  Atenolol 100 Mg Tabs (Atenolol) .... Take 1 tablet by mouth once a day 2)  Simvastatin 40 Mg Tabs (Simvastatin) .... One by mouth qpm 3)  Tylenol  .... As needed 4)  Lisinopril 40 Mg Tabs (Lisinopril) .... Qd  Other Orders: Specimen Handling (95621)  Patient Instructions: 1)  Please schedule a follow-up appointment in 6 months. 2)  Limit your Sodium (Salt). 3)  It is important that you exercise regularly at least 20 minutes 5 times a week. If you develop chest pain, have severe difficulty breathing, or feel very tired , stop exercising immediately and seek medical attention. 4)  You need to lose weight. Consider a lower calorie diet and regular exercise.  5)  follow up urology  Prescriptions:  LISINOPRIL 40 MG TABS (LISINOPRIL) qd  #90 x 4   Entered and Authorized by:   Gordy Savers  MD   Signed by:   Gordy Savers  MD on 07/30/2010   Method used:   Electronically to        [facility]  [phone]* (retail)       [address]       [place], [place]  [address]       Ph: [phone] or [phone]       Fax: [phone]   RxID:   [id number] SIMVASTATIN 40 MG  TABS (SIMVASTATIN) one by mouth qpm  #90 x 6   Entered and Authorized by:   Gordy Savers  MD   Signed by:   Gordy Savers  MD on 07/30/2010   Method used:   Electronically to        [facility]  [phone]* (retail)       [address]       [place], [place]  [address]       Ph: [phone] or [phone]       Fax: [phone]   RxID:   [id number] ATENOLOL 100 MG  TABS (ATENOLOL) Take 1 tablet by mouth once a day  #90 x 5   Entered and Authorized by:   Gordy Savers  MD   Signed by:   Gordy Savers  MD on 07/30/2010   Method used:   Electronically to        [facility]  [phone]* (retail)       [address]       [place], [place]  [address]       Ph: [phone] or [phone]       Fax: [phone]   RxID:   [id number]    Orders Added: 1)  EKG w/ Interpretation [93000] 2)  Medicare -1st Annual Wellness Visit [G0438] 3)  Est. Patient Level III [82423] 4)  Venipuncture [36415] 5)  TLB-Lipid Panel [80061-LIPID] 6)  TLB-BMP (Basic Metabolic Panel-BMET) [80048-METABOL] 7)  TLB-CBC Platelet - w/Differential [85025-CBCD] 8)  TLB-Hepatic/Liver Function Pnl [80076-HEPATIC] 9)  TLB-TSH (Thyroid Stimulating Hormone) [84443-TSH] 10)  Specimen Handling [99000]

## 2010-09-18 NOTE — Progress Notes (Signed)
Summary: lab results  Phone Note Call from Patient   Caller: Patient Reason for Call: Lab or Test Results Summary of Call: wanting lab results mailed to home address. kik Initial call taken by: Duard Brady LPN,  August 15, 2010 3:31 PM  Follow-up for Phone Call        called pt to dicuss- mailed copy per pt request. kik Follow-up by: Duard Brady LPN,  August 15, 2010 3:31 PM

## 2010-12-30 NOTE — Letter (Signed)
March 06, 2008   Barbette Hair. Artist Pais, DO  [address] [place], [place] [address]   Re:  HUTCH, RHETT              DOB:  05/20/1932   Dear Dr. Artist Pais:   I saw the patient back today after his VATS right middle lobectomy.  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  His blood pressure today was 131/83, pulse 63, respirations 18,  and sats were 92%.  He is doing well overall.  His chest x-ray showed  normal postoperative changes.  We removed his chest tube sutures.  I  told him to continue to drop his atenolol from 100 daily to 50 daily,  continue on the Benicar and lovastatin.  I have told him he can start  driving next week.  I will see him back again in 3 weeks with another  chest x-ray.   Ines Bloomer, M.D.  Electronically Signed   DPB/MEDQ  D:  03/06/2008  T:  03/07/2008  Job:  [id number]

## 2010-12-30 NOTE — Letter (Signed)
April 09, 2010   Barbette Hair. Artist Pais, DO  [address] [place], [place] [address]   Re:  John Riley, John Riley              DOB:  1931-11-12   Dear Dr. Artist Pais:   The patient came today for followup.  His blood pressure was 143/88,  pulse 66, respirations 16, and sats were 98%.  His CT scan showed no  evidence of recurrence.  He is now 2 years since his surgery, so we will  see him again in 1 year with another CT scan and if there is no other  evidence, we will probably discontinue followup at that time, since he  had a carcinoid tumor.   Ines Bloomer, M.D.  Electronically Signed   DPB/MEDQ  D:  04/09/2010  T:  04/10/2010  Job:  [id number]

## 2010-12-30 NOTE — Discharge Summary (Signed)
John Riley, John Riley NO.:  [id number]   MEDICAL RECORD NO.:  [id number]         PATIENT TYPE:  CINP   LOCATION:                                 FACILITY:   PHYSICIAN:  John Riley, M.D. DATE OF BIRTH:  September 04, 1931   DATE OF ADMISSION:  02/23/2008  DATE OF DISCHARGE:                               DISCHARGE SUMMARY   FINAL DIAGNOSIS:  Right middle lobe mass, non-small-cell lung cancer.   SECONDARY DIAGNOSES:  1. Hypertension.  2. Hypercholesterolemia.   IN-HOSPITAL OPERATIONS AND PROCEDURES:  Right video-assisted  thoracoscopic surgery with right mini thoracotomy and right middle  lobectomy with lymph node dissection.   PATIENT'S HISTORY AND PHYSICAL AND HOSPITAL COURSE:  The patient is a 77-  year-old male with a long history of tobacco use who recently quit.  He  was found to have right middle lobe nodule which showed that was 70 mm  in diameter.  PET scan done was positive with an FEV of 2.5.  Pulmonary  function tests showed an FVC 2.09 with an FEV-1 of 0.41 which was 74 and  1% predicted.  He quit smoking for about the last 8 week.  He denies  hemoptysis, fever, chills, or excessive sputum.  The patient was seen  and evaluated by Dr. Edwyna Shell.  Dr. Edwyna Shell discussed with the patient  undergoing right thoracotomy with right middle lobectomy.  He discussed  the risks and benefits with the patient.  The patient acknowledged  understanding and agreed to proceed.  Surgery was scheduled for February 23, 2008.  For details of the patient's past medical history and physical  exam, please see dictated H&P.   The patient was taken to the operating room on February 23, 2008, where he  underwent a right video-assisted thoracoscopic surgery with right mini  thoracotomy and right middle lobectomy with lymph node dissection.  The  patient tolerated this procedure and was transferred to the Intensive  Care Unit in stable condition.  The patient was able to be extubated  following surgery.  Post extubation, he was noted to be alert and  oriented x4.  Neuro intact.  The patient was noted to be hemodynamically  stable postoperatively.  Daily chest x-rays obtained.  The patient had  no air leak from Pleur-evac and minimal drainage from chest tube.  Posterior chest tube was discontinued on postop day 1.  The remaining  chest tube was placed to water seal on postop day 2.  Follow up chest x-  ray done on postop day 3 showed no pneumothorax with no air leak, but  did have increasing right lower lobe atelectasis.  Remaining chest tube  was able to be discontinued at this time.  The patient was encouraged to  use his incentive spirometer and working on coughing.  Repeat chest x-  ray done on postop day 4 showed improvement of his right lower lobe  atelectasis.  No pneumothorax was noted.  The patient remained  hemodynamically stable postoperatively.  He was out of bed ambulating  without difficulty.  He was tolerating diet well.  No nausea or  vomiting  noted.  The patient did have a low-grade temperature postoperatively  with leukocytosis.  He was placed on Fortaz prophylactically.  White  blood cell count remained stable at 14.  We will repeat in a.m.  Temperature resolved prior to discharge.  Plan no home antibiotics.  All  incisions on the patient were clean, dry, and intact and healing well.  The patient did have slight increase of his total bilirubin  postoperatively.  It was 2.5 on postop day 2 and on recheck on postop  day 3 was 2.2.   LABS DONE:  Today, on February 27, 2008, showed a white count 12.0,  hemoglobin of 12, hematocrit 36.3, and platelet count 180,000.  Sodium  of 137, potassium 3.9, chloride of 100, bicarb of 27, BUN of 9,  creatinine of 0.78, and glucose of 120.  The patient is tentatively  ready for discharge home in the a.m.  Chest x-ray is stable and he  continues to progress well.   FOLLOW UP APPOINTMENT:  A follow up appointment has been  arranged with  Dr. Edwyna Shell for March 06, 2008, at 4:30 p.m.  The patient will need to  obtain PMI chest x-ray 30 minutes prior to this appointment.   ACTIVITY:  The patient was instructed no driving until released to do  so, no lifting over 10 pounds.  He is told to ambulate 3-4 times per day  and progress as tolerated and to continue breathing exercises.   INCISIONAL CARE:  The patient was told to shower washing his incisions  using soap and water.  He is to contact the office if he develops any  drainage or opening from any of his incision sites.   DIET:  The patient was educated on diet to be low fat and low salt.   DISCHARGE MEDICATIONS:  1. Atenolol 100 mg daily.  2. Benicar 20 mg daily.  3. Simvastatin 40 mg daily.  4. Percocet 5/325 one-two tabs q.4-6 hours p.r.n.      John Riley, John Riley      John Riley, M.D.  Electronically Signed    KMD/MEDQ  D:  02/27/2008  T:  02/28/2008  Job:  [id number]   cc:   John Riley, M.D.

## 2010-12-30 NOTE — Assessment & Plan Note (Signed)
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  His incision  is well healed.  His chest x-ray showed normal postoperative changes.  He is doing well overall.  He has quit smoking.  We will plan to see him  back again in 6-8 months with a CT scan, which will be approximately 1  year since his right middle lobectomy for the carcinoid tumor.   Ines Bloomer, M.D.  Electronically Signed   DPB/MEDQ  D:  08/01/2008  T:  08/02/2008  Job:  [id number]   cc:   Barbette Hair. Artist Pais, DO

## 2010-12-30 NOTE — Assessment & Plan Note (Signed)
OFFICE VISIT   GUNNER, IODICE  DOB:  Jan 19, 1932                                        September 27, 2009  CHART #:  [id number]   HISTORY OF PRESENT ILLNESS:  The patient is status post right middle  lobectomy done by Dr. Edwyna Shell on February 23, 2008, positive for carcinoid  tumor.  The patient was last seen in the office on March 06, 2009, for a  followup CT scan.  At that time, the patient's CT scan showed no  evidence of recurrence of his cancer.  He was scheduled to follow up in  6 months with a chest x-ray.  The patient presents today without  complaints.  He is up ambulating without difficulty.  He denies any  nausea, vomiting, fevers, recent weight loss, dyspnea, and chest pain.   PHYSICAL EXAMINATION:  Vital Signs:  Blood pressure 128/79, pulse 68,  respirations of 16, O2 saturation is 95% on room air.  Respiratory:  Clear to auscultation bilaterally.  Cardiac:  Regular rate and rhythm.   STUDIES:  The patient had PA and lateral chest x-ray today which is  stable chest x-ray with no acute findings.  No acute pulmonary process  noted.   IMPRESSION AND PLAN:  The patient was seen and evaluated by Dr. Edwyna Shell.  I will plan to follow up with a CT scan of the chest in 6 months.  If  this is clear, we will release the patient from practice.  The patient  is in agreement.  If he has any concerns in the interim, he is to  contact us.   Ines Bloomer, M.D.  Electronically Signed   KMD/MEDQ  D:  09/27/2009  T:  09/28/2009  Job:  [id number]   cc:   Barbette Hair. Artist Pais, DO

## 2010-12-30 NOTE — Assessment & Plan Note (Signed)
OFFICE VISIT   John Riley, John Riley  DOB:  [birth year]                                        May 09, 2008  CHART #:  [id number]   HISTORY OF PRESENTING ILLNESS:  This is a 77 year old Caucasian male who  is status post right VATS, right mini-thoracotomy, right middle  lobectomy, lymph node dissection by Dr. Edwyna Shell on 02/23/2008.  Pathology  was consistent with a carcinoid tumor (low-grade neuroendocrine  neoplasm).  Lymphovascular invasion was seen.  Margins are negative for  tumor.  Lymph nodes, no evidence of malignancy.  The patient was last  seen in the office on 03/28/2008.  At that time, he was still having  some incisional pain and his atenolol was increased back to 100 mg p.o.  daily as he had taken preoperatively for better blood pressure control.  Currently, the patient denies any incisional pain.  He denies any  shortness of breath.   PHYSICAL EXAMINATION:  GENERAL:  This is a pleasant 77 year old  Caucasian male, in no acute distress who is alert, oriented, and  cooperative.  VITAL SIGNS:  BP 120/70, pulse rate 56, respirations 18, and O2 sat 94%  on room air.  CARDIOVASCULAR:  Slightly bradycardic.  PULMONARY:  Clear to auscultation bilaterally.  No rales, wheezes, or  rhonchi.  Right chest wounds clean and dry, well healed.   Chest x-ray done today showed stable postop changes of the right  hemithorax and some left basilar atelectasis.   IMPRESSION AND PLAN:  1. Overall, surgically stable status post right lung surgery for      carcinoid tumor.  2. The patient is going to return to see Dr. Edwyna Shell in 3 months with a      chest x-ray.  He is to call for any questions, problem, or concerns      in the interim.   Ines Bloomer, M.D.  Electronically Signed   DZ/MEDQ  D:  05/09/2008  T:  05/10/2008  Job:  [id number]

## 2010-12-30 NOTE — Letter (Signed)
Jan 05, 2008   Barbette Hair. Artist Pais, D.O.  [address] [place], [place] [address]   Re:  ABDOUL, Riley              DOB:  July 28, 1932   Dear Dr. Artist Pais:   I appreciate the opportunity of seeing Mr. John Riley.  This 77 year old  patient has a long history of smoking and just completely quit.  He is  presently retired.  He was found to have a right middle lobe lesion  which a CT scan revealed a 17-mm lesion in his right middle lobe.  A PET  scan was done that was positive with a SUV of 2.5.  Pulmonary function  tests showed an FVC of 2.09 with an FEV-1 of 1.4, 71% of predicted.  He  has quit smoking for about the last 3 to 4 weeks.  He had no hemoptysis,  fever, chills, excessive sputum, or chest pain.   His past medical history significant for hypertension,  hypercholesterolemia.   His medications include:  1.  Atenolol 100 mg a day.  2.  Benicar 20 mg a day.  3.  Simvastatin 40 mg a day.   He has no allergies.   FAMILY HISTORY:  Is noncontributory.   SOCIAL HISTORY:  He is widowed.  He has four children.  Retired.  Quit  smoking about six weeks ago.  Does not drink alcohol on a regular basis.   REVIEW OF SYSTEMS:  He is 225 pounds.  He is 6 feet.  CARDIAC:  No  angina or  atrial fibrillation.  PULMONARY:  No bronchitis, hemoptysis  or wheezing.  GASTROINTESTINAL:  No nausea, vomiting, constipation, or  diarrhea.  GENITOURINARY:  No known kidney disease, dysuria or frequent  urination.  VASCULAR:  No claudication, DVT, TIAs.  NEUROLOGICAL:  No  dizziness, headache, blackouts, seizures.  MUSCULOSKELETAL:  No  arthritis or joint pain.  NEURO/PSYCHIATRIC:  There is no change in  eyesight or hearing.  HEMATOLOGIC:  No problems with bleeding or  clotting disorders.   PHYSICAL EXAMINATION:  He is a well-developed Caucasian male in no acute  distress.  His blood pressure was 122/76, pulse 55, respirations 18,  saturations were 92%.  HEAD, EYES, EARS, NOSE AND THROAT:  Are unremarkable.  NECK:  Was supple without thyromegaly.  There was no supraclavicular or  axillary adenopathy.  CHEST:  Clear to auscultation and percussion.  HEART:  Regular sinus rhythm with no murmurs.  ABDOMEN:  Is obese.  The bowel sounds are normal.  There is no  hepatosplenomegaly.  The pulses are 2+.  There is no clubbing or edema.  NEUROLOGICAL:  He is oriented x3.  Sensory and motor intact.  Cranial  nerves intact.   I have recommended that he has a VATS right middle lobectomy for  resection of this with a node dissection.  He agrees to this, and we  will do it in early June, some around June 2.   I appreciate the opportunity of seeing Mr. [name].   Ines Bloomer, M.D.  Electronically Signed   DPB/MEDQ  D:  01/05/2008  T:  01/05/2008  Job:  [id number]

## 2010-12-30 NOTE — Assessment & Plan Note (Signed)
OFFICE VISIT   John Riley, John Riley  DOB:  Feb 08, 1932                                        March 28, 2008  CHART #:  [id number]   He returned today, and he is doing well.  He is now almost 6 weeks since  his surgery.  His incision is well healed.  Chest x-ray shows  postoperative changes.  His blood pressure is 144/80, pulse 64,  respirations 18, sats were 90%.  I told him to increase his atenolol  back to 100 mg, which he was taking before.  I gave him a prescription  for hydrocodone 7.5, #60.  We will see him back again in 6 weeks with a  chest x-ray.   Ines Bloomer, M.D.  Electronically Signed   DPB/MEDQ  D:  03/28/2008  T:  03/29/2008  Job:  [id number]

## 2010-12-30 NOTE — H&P (Signed)
John Riley, SHIPES NO.:  [id number]   MEDICAL RECORD NO.:  [id number]         PATIENT TYPE:  CINP   LOCATION:                               FACILITY:  [facility]   PHYSICIAN:  Ines Bloomer, M.D. DATE OF BIRTH:  1932-01-23   DATE OF ADMISSION:  02/23/2008  DATE OF DISCHARGE:                              HISTORY & PHYSICAL   CHIEF COMPLAINT:  Lung mass.   This is a 77 year old patient of a long history of smoking and just  recently quit.  He was found to have a right middle lobe nodule which  showed that was 17 mm in diameter.  A PET scan was positive with an SUV  of 2.5.  Pulmonary function tests showed FVC 2.09 with an FEV-1 of 0.41  which was 71% predicted.  He quit smoking for about the last 8 weeks.  He has had no hemoptysis, fever, chills, or excessive sputum, and no  weight loss.   His past medical history is significant for hypertension and  hypercholesterolemia.   MEDICATIONS:  1. Atenolol 100 mg a day.  2. Benicar 20 mg a day.  3. Simvastatin 40 mg a day.   He has no allergies.   FAMILY HISTORY:  Noncontributory.   SOCIAL HISTORY:  He is widowed, has 4 children, is retired, has quit  smoking 6-8 weeks ago, does not drink alcohol on a regular basis.   REVIEW OF SYSTEMS:  He is 6 feet.  He is 225 pounds.  CARDIAC:  No  angina or atrial fibrillation.  PULMONARY:  No marked bronchitis,  hemoptysis, or wheezing.  GI:  No nausea, vomiting, constipation,  diarrhea, or GERD.  GU:  No known kidney disease, dysuria, or frequent  urination.  VASCULAR:  No claudication, DVT, or TIAs.  NEUROLOGIC:  No  dizziness, headaches, blackouts, or seizures.  MUSCULOSKELETAL:  No  arthritis or joint pain.  NEUROPSYCHIATRIC:  No changes and no diseases.  EYE/EAR:  No changes in his eyesight or hearing.  NEUROLOGICAL:  No  problems with bleeding or clotting disorders, or anemia.   PHYSICAL EXAMINATION:  He is a well-developed Caucasian male in no acute  distress.   His blood pressure is 122/76, pulse 55, respirations 18, sats  were 92%.  Head is atraumatic.  Eyes:  Pupils equal, round, and reactive  to light and accommodation.  Extraocular movements are normal.  Ears  tympanic membranes are intact, no nodes.  There is no septal deviation.  Throat without lesion.  Uvula is in the midline.  Neck is supple without  thyromegaly.  There is no supraclavicular or axillary adenopathy.  Chest  is clear to auscultation and percussion.  Heart have regular sinus  rhythm with no murmurs.  Abdomen is soft.  There is no  hepatosplenomegaly.  Pulses are 2+.  There is no clubbing or edema.  Neurological:  He is oriented x3.  Sensory and motor intact.  Cranial  nerves intact.   IMPRESSION:  1. Right upper lobe mass.  2. History of tobacco abuse.  3. Chronic obstructive pulmonary disease.  4. Hypertension.  5.  Hypercholesterolemia.   PLAN:  1. Right video-assisted thoracoscopic surgery.  2. Right middle lobectomy.      Ines Bloomer, M.D.  Electronically Signed     DPB/MEDQ  D:  02/21/2008  T:  02/22/2008  Job:  [id number]

## 2010-12-30 NOTE — Op Note (Signed)
John Riley, John Riley              ACCOUNT NO.:  [id number]   MEDICAL RECORD NO.:  [id number]          PATIENT TYPE:  INP   LOCATION:  2312                         FACILITY:  [facility]   PHYSICIAN:  Ines Bloomer, M.D. DATE OF BIRTH:  June 19, 1932   DATE OF PROCEDURE:  DATE OF DISCHARGE:                               OPERATIVE REPORT   PREOPERATIVE DIAGNOSIS:  Right middle lobe mass.   POSTOPERATIVE DIAGNOSIS:  Non-small cell lung cancer, right middle lobe.   OPERATION PERFORMED:  Right VATS, right middle lobe with node  dissection.   SURGEON:  Ines Bloomer, MD.   FIRST ASSISTANT:  Doree Fudge, [facility].   ANESTHESIA:  General anesthesia.   After percutaneous insertion of all monitoring lines, the patient  underwent general anesthesia, was prepped and draped in the usual  sterile manner.  Two trocar sites were made in the anterior and  posterior axillary lines and the seventh intercostal space.  A 0-degree  scope was inserted and the right middle lobe was seen, and over the  sixth intercostal space, an 8- to 9-cm incision was made splitting the  serratus and entering the sixth intercostal space.  A portion we could  not get the lung retracted well, so we made a 1- to 2-cm incision at the  triangle of auscultation in order to place a Kaiser ring forceps to  retract the lung from medially to lateral.  Then, dissection was started  at the vein, dissecting out the two branches of the vein to the right  middle lobe.  These were dissected out, clipped, and then divided with  the Covidien 2-mm stapler.  This exposed the bronchus to the middle  lobe, which was dissected out and divided with the Covidien 3.2-mm 30-cm  stapler, and the patient had one arterial branch which was dissected  free, stapled and divided with Covidien 2-mm Roticulator stapler.  After  this had been done, we then divided the minor fissure.  The inferior  portion of the major fissure was divided with  electrocautery and then  dissected out several 10R and 11R nodes.  The minor fissure was divided  with the auto sutures green 4.5, 60-mm stapler with several  applications.  The right middle lobe was removed.  The frozen lesion was  a non-small cell lung cancer.  Bronchial margins were negative.  FloSeal  was applied to the staple line.  Two chest tubes were brought into the  trocar sites, tied in place with 2-0 silk.  A single On-Q was inserted  in the usual fashion.  The chest was closed with two pericostals, #1  Vicryl in the muscle layer, 2-0 Vicryl in the subcutaneous tissue, and  Dermabond to the skin.  The patient was returned to the recovery room in  stable condition.      Ines Bloomer, M.D.  Electronically Signed     DPB/MEDQ  D:  02/23/2008  T:  02/23/2008  Job:  [id number]

## 2010-12-30 NOTE — Letter (Signed)
March 06, 2009   Barbette Hair. Artist Pais, DO  [address] [place], [place] [address]   Re:  JOMARI, BARTNIK              DOB:  1932/08/08   Dear Dr. Artist Pais:   I saw the patient back today for followup after right middle lobectomy  for his carcinoid tumor.  He is doing well overall.  His incisions are  well healed.  I told him that a CT scan today showed no evidence of  recurrence of his cancer, even though he did have lymphovascular space  invasion.  We will see him again in 6 months with a chest x-ray then  repeat his CT scan in 2 years.   Ines Bloomer, M.D.  Electronically Signed   DPB/MEDQ  D:  03/06/2009  T:  03/07/2009  Job:  [id number]

## 2011-01-02 NOTE — Assessment & Plan Note (Signed)
[facility]                           PRIMARY CARE OFFICE NOTE   EUAL, LINDSTROM                       MRN:          [id number]  DATE:12/01/2006                            DOB:          September 18, 1931    CHIEF COMPLAINT:  New patient to practice.   HISTORY OF PRESENT ILLNESS:  Patient is a 77 year old white male here to  establish primary care. He was formerly followed by Dr. Madison Hickman  of [facility] but is switching practices due to insurance.   PAST MEDICAL HISTORY:  Significant hypertension, high cholesterol, and  longstanding tobacco abuse. He has been well controlled on Benicar and  atenolol without significant side effects. He has been on Gemfibrozil  600 mg twice a day for his cholesterol. Review of his records notes that  his LDL is less than 100, however, his triglycerides have been  suboptimal at close to 200. He admits to not closely following a low  cholesterol diet.   He denies any history of hear disease or stroke and no report of  abnormal sugars or type 2 diabetes.   Patient has been urged to quit smoking in the past. He has tried to do  so on his own without success. He has tried patches and failed.   PAST MEDICAL HISTORY SUMMARY:  1. Hypertension,  2. Hyperlipidemia.  3. Status post vein stripping left leg 1974.  4. Status post appendectomy 1947.  5. Ongoing tobacco abuse.   CURRENT MEDICATIONS:  1. Atenolol 100 mg a day.  2. Benicar 20 mg once a day.  3. Gemfibrozil 600 mg twice a day.   ALLERGIES TO MEDICATIONS:  None known.   SOCIAL HISTORY:  Patient is widowed, his wife passed away 7 years ago.  He currently lives alone. He is retired from working in the [employer], [employer]. He has 3 children, only 1 daughter that lives in the area in  [place], a son that lives in [place], and a son that lives in  [place].   FAMILY HISTORY:  Patient is not very clear on family history. Mother  apparently died  of complications of coma. Father deceased at age 33,  cause unknown. Denies any family history of cancer, heart disease.   HABITS:  Denies any alcohol, 1 pack per day for greater than 50 years.   REVIEW OF SYSTEMS:  Patient is tolerating his medications for the most  part. He does note mild dizziness after he takes his blood pressure  medications in the morning, otherwise has occasional cough, denies chest  pain. No heartburn, nausea, vomiting, constipation, or diarrhea. He did  have colonoscopy in 1997 which noted a benign polyp. He has not returned  to his gastroenterologist for a repeat exam.   PHYSICAL EXAMINATION:  VITALS: Weight is 238 pounds, temperature is  96.9, pulse is 60, and blood pressure is 130/80 in the left arm in a  seated position.  IN GENERAL: The patient is a pleasant well-developed, well-nourished, 55-  year-old white male who appears somewhat older than his stated age and  has a disheveled appearance.  HEENT: Normocephalic, atraumatic. Pupils are equal and reactive to light  bilaterally. Extraocular motility was intact. Patient was anicteric.  __________  was within normal limits. Patient had bilateral cerumen.  Oropharyngeal exam was unremarkable. Patient has had all teeth  extracted.  NECK: Supple, no adenopathy, carotid bruits, or thyromegaly.  CHEST EXAM: Normal respiratory effort. Scattered expiratory wheezing.  CARDIOVASCULAR: Regular rate and rhythm. No significant murmurs, rubs,  or gallops appreciated.  ABDOMEN: Protuberant, nontender. Positive bowel sounds. No organomegaly.  MUSCULOSKELETAL EXAM: No clubbing, cyanosis, or edema. Patient has  intact pedis dorsalis pulses.  SKIN: Warm and dry.  NEUROLOGIC: Cranial nerves II-XII grossly intact. His affect was  somewhat flat with poor eye contact.   IMPRESSION/RECOMMENDATIONS:  1. Hypertension, controlled.  2. Hyperlipidemia, suboptimal.  3. Ongoing tobacco abuse with probable chronic obstructive  pulmonary      disease.  4. History of colon polyp.  5. Health maintenance.   RECOMMENDATIONS:  It has been approximately 1 year since his follow up  labs. I have recommended repeating his cholesterol and discussed  possibly using different agent for his cholesterol if triglycerides are  suboptimal.   I strongly urged the patient to discontinue smoking, however, he is not  motivated enough to commit to smoking cessation at this time and  declined use of Chantix.   I provided a pamphlet on following a low cholesterol diet and suggested  a repeat screening colonoscopy and he will be referred to [facility].  Follow up time is in approximately 6 weeks.     Barbette Hair. Artist Pais, DO  Electronically Signed    RDY/MedQ  DD: 12/02/2006  DT: 12/02/2006  Job #: [id number]

## 2011-01-20 ENCOUNTER — Encounter: Payer: Self-pay | Admitting: Internal Medicine

## 2011-01-28 ENCOUNTER — Encounter: Payer: Self-pay | Admitting: Internal Medicine

## 2011-01-28 ENCOUNTER — Ambulatory Visit (INDEPENDENT_AMBULATORY_CARE_PROVIDER_SITE_OTHER): Payer: 59 | Admitting: Internal Medicine

## 2011-01-28 DIAGNOSIS — I10 Essential (primary) hypertension: Secondary | ICD-10-CM

## 2011-01-28 DIAGNOSIS — E785 Hyperlipidemia, unspecified: Secondary | ICD-10-CM

## 2011-01-28 MED ORDER — SIMVASTATIN 40 MG PO TABS: 40.0000 mg | ORAL_TABLET | Freq: Every day | ORAL | Status: DC

## 2011-01-28 MED ORDER — LISINOPRIL 40 MG PO TABS: 40.0000 mg | ORAL_TABLET | Freq: Every day | ORAL | Status: DC

## 2011-01-28 MED ORDER — ATENOLOL 100 MG PO TABS: 100.0000 mg | ORAL_TABLET | Freq: Every day | ORAL | Status: DC

## 2011-01-28 NOTE — Patient Instructions (Signed)
Limit your sodium (Salt) intake    It is important that you exercise regularly, at least 20 minutes 3 to 4 times per week.  If you develop chest pain or shortness of breath seek  medical attention.  Return in 6 months for follow-up  

## 2011-01-28 NOTE — Progress Notes (Signed)
  Subjective:    Patient ID: John Riley, male    DOB: 1931-10-06, 77 y.o.   MRN: [id number]  HPI   77 year old patient who has a history of COPD. He is doing quite well and has been off tobacco for approximately 3 years. History of hypertension and dyslipidemia which has been stable. He is followed by urology for elevated PSA. No concerns or complaints today denies any cardiopulmonary complaints   Review of Systems  Constitutional: Negative for fever, chills, appetite change and fatigue.  HENT: Negative for hearing loss, ear pain, congestion, sore throat, trouble swallowing, neck stiffness, dental problem, voice change and tinnitus.   Eyes: Negative for pain, discharge and visual disturbance.  Respiratory: Negative for cough, chest tightness, wheezing and stridor.   Cardiovascular: Negative for chest pain, palpitations and leg swelling.  Gastrointestinal: Negative for nausea, vomiting, abdominal pain, diarrhea, constipation, blood in stool and abdominal distention.  Genitourinary: Negative for urgency, hematuria, flank pain, discharge, difficulty urinating and genital sores.  Musculoskeletal: Negative for myalgias, back pain, joint swelling, arthralgias and gait problem.  Skin: Negative for rash.  Neurological: Negative for dizziness, syncope, speech difficulty, weakness, numbness and headaches.  Hematological: Negative for adenopathy. Does not bruise/bleed easily.  Psychiatric/Behavioral: Negative for behavioral problems and dysphoric mood. The patient is not nervous/anxious.        Objective:   Physical Exam  Constitutional: He is oriented to person, place, and time. He appears well-developed.  HENT:  Head: Normocephalic.  Right Ear: External ear normal.  Left Ear: External ear normal.  Eyes: Conjunctivae and EOM are normal.  Neck: Normal range of motion.  Cardiovascular: Normal rate and normal heart sounds.   Pulmonary/Chest: Breath sounds normal.       O2 saturation 95 pulse  rate 62  Abdominal: Bowel sounds are normal.  Musculoskeletal: Normal range of motion. He exhibits no edema and no tenderness.  Neurological: He is alert and oriented to person, place, and time.  Psychiatric: He has a normal mood and affect. His behavior is normal.          Assessment & Plan:   Hypertension well controlled Dyslipidemia stable  COPD stable  History of elevated PSA. Per urology  Medications refilled. We'll re\re see in 6 months for his annual exam and lab up at

## 2011-02-26 ENCOUNTER — Other Ambulatory Visit: Payer: Self-pay | Admitting: Thoracic Surgery

## 2011-02-26 DIAGNOSIS — R911 Solitary pulmonary nodule: Secondary | ICD-10-CM

## 2011-04-14 ENCOUNTER — Ambulatory Visit: Payer: 59 | Admitting: Thoracic Surgery

## 2011-04-14 ENCOUNTER — Other Ambulatory Visit: Payer: 59

## 2011-05-12 LAB — POCT I-STAT, CHEM 8
BUN: 21
Calcium, Ion: 1.19
Chloride: 104
Creatinine, Ser: 1.1
Glucose, Bld: 104 — ABNORMAL HIGH
HCT: 47
Hemoglobin: 16
Potassium: 4.2
Sodium: 139
TCO2: 26

## 2011-05-12 LAB — POCT CARDIAC MARKERS
CKMB, poc: 1.7
Myoglobin, poc: 110
Operator id: 288331
Troponin i, poc: <0.05

## 2011-05-12 LAB — DIFFERENTIAL
Basophils Absolute: 0
Basophils Relative: 0
Eosinophils Absolute: 0.1
Eosinophils Relative: 1
Lymphocytes Relative: 18
Lymphs Abs: 1.8
Monocytes Absolute: 0.9
Monocytes Relative: 9
Neutro Abs: 7.1
Neutrophils Relative %: 72

## 2011-05-12 LAB — CBC
HCT: 46.3
Hemoglobin: 16
MCHC: 34.5
MCV: 93.8
Platelets: 232
RBC: 4.94
RDW: 11.7
WBC: 9.9

## 2011-05-14 LAB — URINALYSIS, ROUTINE W REFLEX MICROSCOPIC
Ketones, ur: NEGATIVE
Nitrite: NEGATIVE
Protein, ur: NEGATIVE

## 2011-05-14 LAB — CULTURE, RESPIRATORY W GRAM STAIN: Culture: NORMAL

## 2011-05-14 LAB — BASIC METABOLIC PANEL
BUN: 9
CO2: 25
CO2: 27
Calcium: 8.4
Chloride: 105
Creatinine, Ser: 0.78
GFR calc Af Amer: >60
Glucose, Bld: 125 — ABNORMAL HIGH
Potassium: 3.4 — ABNORMAL LOW

## 2011-05-14 LAB — COMPREHENSIVE METABOLIC PANEL
ALT: 43
AST: 21
Albumin: 3.1 — ABNORMAL LOW
Alkaline Phosphatase: 39
Alkaline Phosphatase: 62
BUN: 10
CO2: 21
CO2: 27
Calcium: 9.4
Creatinine, Ser: 0.76
Creatinine, Ser: 0.81
GFR calc Af Amer: >60
GFR calc non Af Amer: >60
GFR calc non Af Amer: >60
Glucose, Bld: 111 — ABNORMAL HIGH
Glucose, Bld: 92
Potassium: 3.9
Potassium: 3.9
Sodium: 135
Sodium: 137
Total Protein: 5.6 — ABNORMAL LOW
Total Protein: 7

## 2011-05-14 LAB — BLOOD GAS, ARTERIAL
Acid-Base Excess: 0.6
Drawn by: 206361
FIO2: 0.21
O2 Saturation: 96
Patient temperature: 98.6
pO2, Arterial: 82.3

## 2011-05-14 LAB — PROTIME-INR: Prothrombin Time: 13.1

## 2011-05-14 LAB — TYPE AND SCREEN: ABO/RH(D): A POS

## 2011-05-14 LAB — CBC
HCT: 37.4 — ABNORMAL LOW
HCT: 37.4 — ABNORMAL LOW
Hemoglobin: 13.1
MCHC: 34.2
MCHC: 34.3
MCHC: 35.3
MCV: 94.4
MCV: 94.9
MCV: 95.4
Platelets: 152
Platelets: 163
Platelets: 188
RBC: 3.79 — ABNORMAL LOW
RBC: 3.88 — ABNORMAL LOW
RBC: 3.9 — ABNORMAL LOW
RBC: 4.67
RDW: 11.7
RDW: 11.7
RDW: 11.9
WBC: 10.8 — ABNORMAL HIGH
WBC: 13.4 — ABNORMAL HIGH
WBC: 14.1 — ABNORMAL HIGH

## 2011-05-14 LAB — POCT I-STAT 3, ART BLOOD GAS (G3+)
O2 Saturation: 96
Patient temperature: 97.8
pO2, Arterial: 78 — ABNORMAL LOW

## 2011-05-14 LAB — EXPECTORATED SPUTUM ASSESSMENT W GRAM STAIN, RFLX TO RESP C

## 2011-05-14 LAB — URINE CULTURE
Colony Count: NO GROWTH
Special Requests: NEGATIVE

## 2011-05-14 LAB — APTT: aPTT: 30

## 2011-05-20 ENCOUNTER — Ambulatory Visit: Payer: 59 | Admitting: Thoracic Surgery

## 2011-05-20 ENCOUNTER — Other Ambulatory Visit: Payer: 59

## 2011-05-20 DIAGNOSIS — C3490 Malignant neoplasm of unspecified part of unspecified bronchus or lung: Secondary | ICD-10-CM | POA: Insufficient documentation

## 2011-08-05 ENCOUNTER — Ambulatory Visit (INDEPENDENT_AMBULATORY_CARE_PROVIDER_SITE_OTHER): Payer: 59 | Admitting: Internal Medicine

## 2011-08-05 ENCOUNTER — Encounter: Payer: Self-pay | Admitting: Internal Medicine

## 2011-08-05 VITALS — BP 130/80 | HR 76 | Temp 97.8°F | Resp 18 | Ht 72.0 in | Wt 247.0 lb

## 2011-08-05 DIAGNOSIS — C3490 Malignant neoplasm of unspecified part of unspecified bronchus or lung: Secondary | ICD-10-CM

## 2011-08-05 DIAGNOSIS — E785 Hyperlipidemia, unspecified: Secondary | ICD-10-CM

## 2011-08-05 DIAGNOSIS — R972 Elevated prostate specific antigen [PSA]: Secondary | ICD-10-CM

## 2011-08-05 DIAGNOSIS — I10 Essential (primary) hypertension: Secondary | ICD-10-CM

## 2011-08-05 DIAGNOSIS — R160 Hepatomegaly, not elsewhere classified: Secondary | ICD-10-CM

## 2011-08-05 LAB — CBC WITH DIFFERENTIAL/PLATELET
Basophils Absolute: 0 10*3/uL (ref 0.0–0.1)
Eosinophils Absolute: 0.1 10*3/uL (ref 0.0–0.7)
Hemoglobin: 15.5 g/dL (ref 13.0–17.0)
Lymphocytes Relative: 17.5 % (ref 12.0–46.0)
MCHC: 34 g/dL (ref 30.0–36.0)
Neutro Abs: 5.8 10*3/uL (ref 1.4–7.7)
Platelets: 198 10*3/uL (ref 150.0–400.0)
RDW: 12 % (ref 11.5–14.6)

## 2011-08-05 LAB — COMPREHENSIVE METABOLIC PANEL
ALT: 29 U/L (ref 0–53)
AST: 29 U/L (ref 0–37)
Albumin: 4.4 g/dL (ref 3.5–5.2)
CO2: 29 mEq/L (ref 19–32)
Calcium: 9.3 mg/dL (ref 8.4–10.5)
Chloride: 104 mEq/L (ref 96–112)
Creatinine, Ser: 0.9 mg/dL (ref 0.4–1.5)
Potassium: 5.1 mEq/L (ref 3.5–5.1)
Sodium: 142 mEq/L (ref 135–145)
Total Protein: 7.5 g/dL (ref 6.0–8.3)

## 2011-08-05 LAB — TSH: TSH: 1.28 u[IU]/mL (ref 0.35–5.50)

## 2011-08-05 LAB — LIPID PANEL
Cholesterol: 106 mg/dL (ref 0–200)
HDL: 33.6 mg/dL — ABNORMAL LOW (ref >39.00)
Triglycerides: 121 mg/dL (ref 0.0–149.0)
VLDL: 24.2 mg/dL (ref 0.0–40.0)

## 2011-08-05 LAB — PSA: PSA: 9.31 ng/mL — ABNORMAL HIGH (ref 0.10–4.00)

## 2011-08-05 MED ORDER — ATENOLOL 100 MG PO TABS: 100.0000 mg | ORAL_TABLET | Freq: Every day | ORAL | Status: DC

## 2011-08-05 MED ORDER — LISINOPRIL 40 MG PO TABS: 40.0000 mg | ORAL_TABLET | Freq: Every day | ORAL | Status: DC

## 2011-08-05 MED ORDER — SIMVASTATIN 40 MG PO TABS: 40.0000 mg | ORAL_TABLET | Freq: Every day | ORAL | Status: DC

## 2011-08-05 NOTE — Progress Notes (Signed)
Subjective:    Patient ID: John Riley, male    DOB: 1932/04/12, 77 y.o.   MRN: [id number]  HPI  77 year old patient who is seen today for a wellness exam. He is status post right middle lobe resection for a non-small cell lung cancer in 2009. He discontinued tobacco use in the spring of 2009. He has been followed by urology due to an elevated PSA. He has dyslipidemia and treated hypertension. He is doing quite well. He is a nondrinker but in the past had consumed beer      Here for Medicare AWV:   1. Risk factors based on Past M, S, F history:cardiovascular risk factors include hypertension, and dyslipidemia  2. Physical Activities: no activity restrictions, although fairly sedentary  3. Depression/mood: no history depression, or mood disorder  4. Hearing: mildly impaired  5. ADL's: independent in all aspects of daily living. Lives independently  6. Fall Risk: low  7. Home Safety: no problems identified  8. Height, weight, &visual acuity:height and weight stable. No difficulty with visual acuity  9. Counseling: heart healthy diet, exercise, weight loss. All encouraged  10. Labs ordered based on risk factors: laboratory profile, including lipid panel will be reviewed  11. Referral Coordination- follow-up urology in the spring  12. Care Plan- weight loss, heart healthy diet  13. Cognitive Assessment- alert and oriented, with normal affect. No cognitive dysfunction. 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   Allergies:  1) ! Niaspan (Niacin (Antihyperlipidemic))  Past History:   Past Medical History:  Reviewed history from 01/24/2010 and no changes required.  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  Appendectomy  S/P Vein stripping  right Vats,  minithoracotomy with right middle lobe, lobectomy July 2009  prostate biopsy 2011   Family History:  Reviewed history from 11/09/2007 and no changes required.  Mother apparently died of complications of coma. Father deceased at age 27,  cause unknown. Denies any family history of cancer, heart disease.   Social History:  Reviewed history from 07/26/2009 and no changes required.  Patient is widowed, his wife passed away 8  years ago. He lives alone. He is retired from working in the [employer]. He has 3 children, only 1 daughter that lives in the area in [place], a son that lives in [place], and a son that lives in [place].   Alcohol use-no  Current Smoker 1 ppd >50 pk years- d/c tobacco 4-09   Review of Systems  Constitutional: Negative for fever, chills, activity change, appetite change and fatigue.  HENT: Negative for hearing loss, ear pain, congestion, rhinorrhea, sneezing, mouth sores, trouble swallowing, neck pain, neck stiffness, dental problem, voice change, sinus pressure and tinnitus.   Eyes: Negative for photophobia, pain, redness and visual disturbance.  Respiratory: Negative for apnea, cough, choking, chest tightness, shortness of breath and wheezing.   Cardiovascular: Negative for chest pain, palpitations and leg swelling.  Gastrointestinal: Negative for nausea, vomiting, abdominal pain, diarrhea, constipation, blood in stool, abdominal distention, anal bleeding and rectal pain.  Genitourinary: Negative for dysuria, urgency, frequency, hematuria, flank pain, decreased urine volume, discharge, penile swelling, scrotal swelling, difficulty urinating, genital sores and testicular pain.  Musculoskeletal: Negative for myalgias, back pain, joint swelling, arthralgias and gait problem.  Skin: Negative for color change, rash and wound.  Neurological: Negative for dizziness, tremors, seizures, syncope, facial asymmetry, speech difficulty, weakness, light-headedness, numbness  and headaches.  Hematological: Negative for adenopathy. Does not bruise/bleed easily.  Psychiatric/Behavioral: Negative for suicidal ideas, hallucinations, behavioral problems, confusion, sleep disturbance, self-injury, dysphoric mood, decreased concentration and agitation. The patient is not nervous/anxious.        Objective:   Physical Exam  Constitutional: He appears well-developed and well-nourished.  HENT:  Head: Normocephalic and atraumatic.  Right Ear: External ear normal.  Left Ear: External ear normal.  Nose: Nose normal.  Mouth/Throat: Oropharynx is clear and moist.  Eyes: Conjunctivae and EOM are normal. Pupils are equal, round, and reactive to light. No scleral icterus.  Neck: Normal range of motion. Neck supple. No JVD present. No thyromegaly present.  Cardiovascular: Regular rhythm, normal heart sounds and intact distal pulses.  Exam reveals no gallop and no friction rub.   No murmur heard. Pulmonary/Chest: Effort normal and breath sounds normal. He exhibits no tenderness.  Abdominal: Soft. Bowel sounds are normal. He exhibits no distension and no mass. There is no tenderness.       Hepatic enlargement  Genitourinary: Penis normal. Guaiac negative stool.       Prostate +2 symmetrically enlarged  Musculoskeletal: Normal range of motion. He exhibits edema. He exhibits no tenderness.       +1 edema distal to the knee is  Lymphadenopathy:    He has no cervical adenopathy.  Neurological: He is alert. He has normal reflexes. No cranial nerve deficit. Coordination normal.  Skin: Skin is warm and dry. No rash noted.  Psychiatric: He has a normal mood and affect. His behavior is normal.          Assessment & Plan:    Preventive health examination Hepatomegaly History of elevated PSA  We'll obtain an abdominal ultrasound and check screening lab. Medical regimen unchanged Recheck 6 months or when necessary

## 2011-08-06 NOTE — Progress Notes (Signed)
Quick Note:  Spoke with pt - informed of dr. Vernon Prey instructions to see urology after the hoilday - he will make appt. ______

## 2012-02-03 ENCOUNTER — Encounter: Payer: Self-pay | Admitting: Internal Medicine

## 2012-02-03 ENCOUNTER — Ambulatory Visit (INDEPENDENT_AMBULATORY_CARE_PROVIDER_SITE_OTHER): Payer: 59 | Admitting: Internal Medicine

## 2012-02-03 VITALS — BP 138/70 | Temp 98.3°F | Wt 248.0 lb

## 2012-02-03 DIAGNOSIS — I10 Essential (primary) hypertension: Secondary | ICD-10-CM

## 2012-02-03 DIAGNOSIS — R972 Elevated prostate specific antigen [PSA]: Secondary | ICD-10-CM

## 2012-02-03 DIAGNOSIS — E785 Hyperlipidemia, unspecified: Secondary | ICD-10-CM

## 2012-02-03 MED ORDER — ATENOLOL 100 MG PO TABS: 100.0000 mg | ORAL_TABLET | Freq: Every day | ORAL | Status: DC

## 2012-02-03 MED ORDER — LISINOPRIL 40 MG PO TABS: 40.0000 mg | ORAL_TABLET | Freq: Every day | ORAL | Status: DC

## 2012-02-03 MED ORDER — SIMVASTATIN 40 MG PO TABS: 40.0000 mg | ORAL_TABLET | Freq: Every day | ORAL | Status: DC

## 2012-02-03 NOTE — Progress Notes (Signed)
  Subjective:    Patient ID: John Riley, male    DOB: Dec 10, 1931, 78 y.o.   MRN: [id number]  HPI is a 78 year old patient who is seen today for his six-month followup. History of hypertension which has been stable. His history of dyslipidemia which has been controlled with simvastatin 40. He is doing quite well today denies any cardiopulmonary complaints he has remote history of non-small cell lung cancer involving the right middle lobe he has a history of elevated PSA and also some insomnia issues. Otherwise he is doing quite well. He is asking about the shingles vaccine     Review of Systems  Constitutional: Negative for fever, chills, appetite change and fatigue.  HENT: Negative for hearing loss, ear pain, congestion, sore throat, trouble swallowing, neck stiffness, dental problem, voice change and tinnitus.   Eyes: Negative for pain, discharge and visual disturbance.  Respiratory: Negative for cough, chest tightness, wheezing and stridor.   Cardiovascular: Negative for chest pain, palpitations and leg swelling.  Gastrointestinal: Negative for nausea, vomiting, abdominal pain, diarrhea, constipation, blood in stool and abdominal distention.  Genitourinary: Negative for urgency, hematuria, flank pain, discharge, difficulty urinating and genital sores.  Musculoskeletal: Negative for myalgias, back pain, joint swelling, arthralgias and gait problem.  Skin: Negative for rash.  Neurological: Negative for dizziness, syncope, speech difficulty, weakness, numbness and headaches.  Hematological: Negative for adenopathy. Does not bruise/bleed easily.  Psychiatric/Behavioral: Negative for behavioral problems and dysphoric mood. The patient is not nervous/anxious.        Objective:   Physical Exam  Constitutional: He is oriented to person, place, and time. He appears well-developed.  HENT:  Head: Normocephalic.  Right Ear: External ear normal.  Left Ear: External ear normal.  Eyes:  Conjunctivae and EOM are normal.  Neck: Normal range of motion.  Cardiovascular: Normal rate and normal heart sounds.   Pulmonary/Chest: Breath sounds normal.  Abdominal: Bowel sounds are normal.  Musculoskeletal: Normal range of motion. He exhibits no edema and no tenderness.       Trace edema  Neurological: He is alert and oriented to person, place, and time.  Psychiatric: He has a normal mood and affect. His behavior is normal.          Assessment & Plan:  Hypertension controlled History dyslipidemia History of elevated PSA  We'll check on the availability of shingles vaccine. We'll see in 6 months for an annual exam All medications refilled  Low-salt diet exercise weight loss all encouraged

## 2012-02-03 NOTE — Patient Instructions (Signed)
Limit your sodium (Salt) intake    It is important that you exercise regularly, at least 20 minutes 3 to 4 times per week.  If you develop chest pain or shortness of breath seek  medical attention.  You need to lose weight.  Consider a lower calorie diet and regular exercise.  Return in 6 months for follow-up   

## 2012-03-21 ENCOUNTER — Ambulatory Visit (INDEPENDENT_AMBULATORY_CARE_PROVIDER_SITE_OTHER): Payer: 59 | Admitting: Internal Medicine

## 2012-03-21 ENCOUNTER — Encounter: Payer: Self-pay | Admitting: Internal Medicine

## 2012-03-21 VITALS — BP 124/80 | Temp 97.9°F | Wt 249.0 lb

## 2012-03-21 DIAGNOSIS — I10 Essential (primary) hypertension: Secondary | ICD-10-CM

## 2012-03-21 DIAGNOSIS — M2550 Pain in unspecified joint: Secondary | ICD-10-CM

## 2012-03-21 NOTE — Patient Instructions (Signed)
  Take [phone] mg of Tylenol every 6 hours as needed for pain relief or fever.  Avoid taking more than 3000 mg in a 24-hour period (  This may cause liver damage).   Take Aleve 200 mg twice daily for pain or swelling   Call or return to clinic prn if these symptoms worsen or fail to improve as anticipated.

## 2012-03-21 NOTE — Progress Notes (Signed)
  Subjective:    Patient ID: John Riley, male    DOB: 07/01/1932, 78 y.o.   MRN: [id number]  HPI  78 year old patient has a history of treated hypertension. He also has a history of mild osteoarthritis. For the past 2 or 3 weeks he has had increasing right shoulder and right knee pain. Also has had some left shoulder discomfort. He states his father had late onset rheumatoid arthritis. He has been using the acetaminophen with benefit.    Review of Systems  Constitutional: Negative for fever, chills, appetite change and fatigue.  HENT: Negative for hearing loss, ear pain, congestion, sore throat, trouble swallowing, neck stiffness, dental problem, voice change and tinnitus.   Eyes: Negative for pain, discharge and visual disturbance.  Respiratory: Negative for cough, chest tightness, wheezing and stridor.   Cardiovascular: Negative for chest pain, palpitations and leg swelling.  Gastrointestinal: Negative for nausea, vomiting, abdominal pain, diarrhea, constipation, blood in stool and abdominal distention.  Genitourinary: Negative for urgency, hematuria, flank pain, discharge, difficulty urinating and genital sores.  Musculoskeletal: Positive for joint swelling (intermittent swelling right knee) and arthralgias. Negative for myalgias, back pain and gait problem.  Skin: Negative for rash.  Neurological: Negative for dizziness, syncope, speech difficulty, weakness, numbness and headaches.  Hematological: Negative for adenopathy. Does not bruise/bleed easily.  Psychiatric/Behavioral: Negative for behavioral problems and dysphoric mood. The patient is not nervous/anxious.        Objective:   Physical Exam  Constitutional: He appears well-developed and well-nourished. No distress.  Musculoskeletal:       No signs of active synovitis Range of motion the shoulders fairly well intact No obvious right knee effusion or  signs of active inflammation           Assessment & Plan:    Arthralgias. Will treat with Depo-Medrol 80 and continue acetaminophen. Will add Aleve to his regimen. Symptoms do not improve or if they resolve and then recur will consider a rheumatologic evaluation Hypertension stable

## 2012-04-12 ENCOUNTER — Ambulatory Visit (INDEPENDENT_AMBULATORY_CARE_PROVIDER_SITE_OTHER): Payer: 59 | Admitting: Internal Medicine

## 2012-04-12 ENCOUNTER — Encounter: Payer: Self-pay | Admitting: Internal Medicine

## 2012-04-12 VITALS — BP 112/76 | Temp 98.4°F | Wt 245.0 lb

## 2012-04-12 DIAGNOSIS — M2550 Pain in unspecified joint: Secondary | ICD-10-CM

## 2012-04-12 DIAGNOSIS — I10 Essential (primary) hypertension: Secondary | ICD-10-CM

## 2012-04-12 MED ORDER — HYDROCODONE-ACETAMINOPHEN 5-500 MG PO TABS: 1.0000 | ORAL_TABLET | Freq: Three times a day (TID) | ORAL | Status: AC | PRN

## 2012-04-12 MED ORDER — MELOXICAM 15 MG PO TABS: 15.0000 mg | ORAL_TABLET | Freq: Every day | ORAL | Status: DC

## 2012-04-12 NOTE — Progress Notes (Signed)
Subjective:    Patient ID: John Riley, male    DOB: Dec 29, 1931, 78 y.o.   MRN: [id number]  HPI  78 year old patient who is seen today complaining of arthralgias. He now has a 5-6 week history of joint pain mainly in involving both knees the right greater than left. He also has had some shoulder difficulty and also some stiffness in the hands again the right greater than the left. He has history of hypertension and dyslipidemia.  He was seen a couple weeks ago and given a single injection of Depo-Medrol without benefit. He has been using Aleve and Tylenol.   He wakes with knee pain but no generalized stiffness or other constitutional complaints.  He does have a remote history of a right middle lobe lobectomy for non-small cell lung cancer  Past Medical History  Diagnosis Date  . ELEVATED PROSTATE SPECIFIC ANTIGEN 07/31/2009  . HYPERLIPIDEMIA 06/28/2007  . HYPERTENSION 06/28/2007  . INSOMNIA 12/07/2007  . PULMONARY NODULE, RIGHT MIDDLE LOBE 12/07/2007  . TOBACCO ABUSE 11/09/2007  . Non-small cell lung cancer     History   Social History  . Marital Status: Widowed    Spouse Name: N/A    Number of Children: N/A  . Years of Education: N/A   Occupational History  . Not on file.   Social History Main Topics  . Smoking status: Former Smoker    Quit date: 08/18/2007  . Smokeless tobacco: Never Used  . Alcohol Use: No  . Drug Use: No  . Sexually Active: Not on file   Other Topics Concern  . Not on file   Social History Narrative  . No narrative on file    Past Surgical History  Procedure Date  . Appendectomy   . Vein ligation and stripping   . Lobectomy     right middle  . Prostate surgery     bx    No family history on file.  Allergies  Allergen Reactions  . Niacin     Current Outpatient Prescriptions on File Prior to Visit  Medication Sig Dispense Refill  . acetaminophen (TYLENOL) 325 MG tablet Take 650 mg by mouth every 6 (six) hours as needed.        Marland Kitchen  atenolol (TENORMIN) 100 MG tablet Take 1 tablet (100 mg total) by mouth daily.  90 tablet  6  . lisinopril (PRINIVIL,ZESTRIL) 40 MG tablet Take 1 tablet (40 mg total) by mouth daily.  90 tablet  6  . simvastatin (ZOCOR) 40 MG tablet Take 1 tablet (40 mg total) by mouth at bedtime.  90 tablet  6    BP 112/76  Temp 98.4 F (36.9 C) (Oral)  Wt 245 lb (111.131 kg)      Review of Systems  Constitutional: Negative for fever, chills, appetite change and fatigue.  HENT: Negative for hearing loss, ear pain, congestion, sore throat, trouble swallowing, neck stiffness, dental problem, voice change and tinnitus.   Eyes: Negative for pain, discharge and visual disturbance.  Respiratory: Negative for cough, chest tightness, wheezing and stridor.   Cardiovascular: Negative for chest pain, palpitations and leg swelling.  Gastrointestinal: Negative for nausea, vomiting, abdominal pain, diarrhea, constipation, blood in stool and abdominal distention.  Genitourinary: Negative for urgency, hematuria, flank pain, discharge, difficulty urinating and genital sores.  Musculoskeletal: Positive for joint swelling and arthralgias. Negative for myalgias, back pain and gait problem.  Skin: Negative for rash.  Neurological: Negative for dizziness, syncope, speech difficulty, weakness, numbness and headaches.  Hematological:  Negative for adenopathy. Does not bruise/bleed easily.  Psychiatric/Behavioral: Negative for behavioral problems and dysphoric mood. The patient is not nervous/anxious.        Objective:   Physical Exam  Constitutional: He appears well-developed and well-nourished. No distress.  Musculoskeletal:       Hypertrophic changes right knee but no obvious effusion or inflammatory signs Grip strength normal both hands. No obvious synovitis          Assessment & Plan:     Arthralgias-  Primarily  Knee and shoulder pain;  Does not appear  to be steroid responsive;  FH of RA HTN  Will  check RA titer  ESR anti CCP

## 2012-04-12 NOTE — Patient Instructions (Signed)
Limit your sodium (Salt) intake  Take medications as discussed  Call or return to clinic prn if these symptoms worsen or fail to improve as anticipated.

## 2012-04-13 ENCOUNTER — Other Ambulatory Visit: Payer: Self-pay | Admitting: Internal Medicine

## 2012-04-13 ENCOUNTER — Encounter: Payer: Self-pay | Admitting: Internal Medicine

## 2012-04-13 DIAGNOSIS — M25519 Pain in unspecified shoulder: Secondary | ICD-10-CM

## 2012-04-13 DIAGNOSIS — M79606 Pain in leg, unspecified: Secondary | ICD-10-CM

## 2012-04-13 DIAGNOSIS — R700 Elevated erythrocyte sedimentation rate: Secondary | ICD-10-CM

## 2012-04-13 NOTE — Progress Notes (Signed)
Quick Note:  Attempt to call- "the customer you are trying to call is not accepting calls" Letter sent ______

## 2012-04-19 ENCOUNTER — Telehealth: Payer: Self-pay | Admitting: Internal Medicine

## 2012-04-19 NOTE — Telephone Encounter (Signed)
Number: [phone]. Pt returning a call from the office in regard to an appt/referral to RA specialist.  Informed pt  he will receive a call from the office about the appt. Marland Kitchen   PLEASE FOLLOW UP WITH PT ABOUT REFERRAL APPT.  Thank you.

## 2012-04-19 NOTE — Telephone Encounter (Signed)
Called and spoke with pt and pt states Dr.Treslow's office had contacted pt with an appt on 04/28/12 at 1:30 pm.

## 2012-05-13 ENCOUNTER — Ambulatory Visit: Payer: 59 | Admitting: Internal Medicine

## 2012-05-31 ENCOUNTER — Other Ambulatory Visit: Payer: Self-pay | Admitting: Rheumatology

## 2012-05-31 ENCOUNTER — Ambulatory Visit
Admission: RE | Admit: 2012-05-31 | Discharge: 2012-05-31 | Disposition: A | Discharge status: Home / Self Care | Payer: 59 | Source: Ambulatory Visit | Attending: Rheumatology | Admitting: Rheumatology

## 2012-05-31 DIAGNOSIS — Z85118 Personal history of other malignant neoplasm of bronchus and lung: Secondary | ICD-10-CM

## 2012-05-31 DIAGNOSIS — R200 Anesthesia of skin: Secondary | ICD-10-CM

## 2012-05-31 DIAGNOSIS — M542 Cervicalgia: Secondary | ICD-10-CM

## 2012-05-31 DIAGNOSIS — R634 Abnormal weight loss: Secondary | ICD-10-CM

## 2012-08-05 ENCOUNTER — Encounter: Payer: Self-pay | Admitting: Internal Medicine

## 2012-08-05 ENCOUNTER — Ambulatory Visit (INDEPENDENT_AMBULATORY_CARE_PROVIDER_SITE_OTHER): Payer: 59 | Admitting: Internal Medicine

## 2012-08-05 VITALS — BP 130/80 | HR 76 | Temp 97.8°F | Resp 20 | Ht 71.5 in | Wt 240.0 lb

## 2012-08-05 DIAGNOSIS — C61 Malignant neoplasm of prostate: Secondary | ICD-10-CM

## 2012-08-05 DIAGNOSIS — E785 Hyperlipidemia, unspecified: Secondary | ICD-10-CM

## 2012-08-05 DIAGNOSIS — I10 Essential (primary) hypertension: Secondary | ICD-10-CM

## 2012-08-05 DIAGNOSIS — Z0000 Encounter for general adult medical examination without abnormal findings: Secondary | ICD-10-CM

## 2012-08-05 DIAGNOSIS — C3490 Malignant neoplasm of unspecified part of unspecified bronchus or lung: Secondary | ICD-10-CM

## 2012-08-05 LAB — COMPREHENSIVE METABOLIC PANEL
ALT: 25 U/L (ref 0–53)
CO2: 30 mEq/L (ref 19–32)
Calcium: 9.4 mg/dL (ref 8.4–10.5)
Chloride: 98 mEq/L (ref 96–112)
GFR: 100.79 mL/min (ref >60.00)
Potassium: 4.3 mEq/L (ref 3.5–5.1)
Sodium: 138 mEq/L (ref 135–145)
Total Protein: 7.4 g/dL (ref 6.0–8.3)

## 2012-08-05 LAB — LIPID PANEL
Total CHOL/HDL Ratio: 3
Triglycerides: 108 mg/dL (ref 0.0–149.0)

## 2012-08-05 LAB — CBC WITH DIFFERENTIAL/PLATELET
Basophils Absolute: 0 10*3/uL (ref 0.0–0.1)
Eosinophils Absolute: 0 10*3/uL (ref 0.0–0.7)
Lymphocytes Relative: 13.7 % (ref 12.0–46.0)
Lymphs Abs: 1.6 10*3/uL (ref 0.7–4.0)
MCHC: 34 g/dL (ref 30.0–36.0)
Monocytes Relative: 8.3 % (ref 3.0–12.0)
Platelets: 179 10*3/uL (ref 150.0–400.0)
RDW: 13.4 % (ref 11.5–14.6)

## 2012-08-05 LAB — TSH: TSH: 1.67 u[IU]/mL (ref 0.35–5.50)

## 2012-08-05 MED ORDER — SIMVASTATIN 40 MG PO TABS: 40.0000 mg | ORAL_TABLET | Freq: Every day | ORAL | Status: DC

## 2012-08-05 MED ORDER — ATENOLOL 100 MG PO TABS: 100.0000 mg | ORAL_TABLET | Freq: Every day | ORAL | Status: DC

## 2012-08-05 MED ORDER — LISINOPRIL 40 MG PO TABS: 40.0000 mg | ORAL_TABLET | Freq: Every day | ORAL | Status: DC

## 2012-08-05 NOTE — Progress Notes (Signed)
Patient ID: John Riley, male   DOB: Feb 07, 1932, 78 y.o.   MRN: [id number]  Subjective:    Patient ID: John Riley, male    DOB: 11/06/1931, 78 y.o.   MRN: [id number]  Hyperlipidemia Pertinent negatives include no chest pain, myalgias or shortness of breath.  Hypertension Pertinent negatives include no chest pain, headaches, neck pain, palpitations or shortness of breath.  27 -year-old patient who is seen today for a wellness exam. He is status post right middle lobe resection for a non-small cell lung cancer in 2009. He discontinued tobacco use in the spring of 2009. He has been followed by urology due to an elevated PSA. He had a prostate biopsy in 2011 that revealed low-grade prostate cancer. He is followed by urology annually.He has dyslipidemia and treated hypertension. He is doing quite well. He is a nondrinker but in the past had consumed beer  More recently he has been followed by Dr. Kellie Simmering do to an inflammatory arthritis. He presently is on prednisone 20 mg daily      Here for Medicare AWV:   1. Risk factors based on Past M, S, F history:cardiovascular risk factors include hypertension, and dyslipidemia  2. Physical Activities: no activity restrictions, although fairly sedentary  3. Depression/mood: no history depression, or mood disorder  4. Hearing: mildly impaired  5. ADL's: independent in all aspects of daily living. Lives independently  6. Fall Risk: low  7. Home Safety: no problems identified  8. Height, weight, &visual acuity:height and weight stable. No difficulty with visual acuity  9. Counseling: heart healthy diet, exercise, weight loss. All encouraged  10. Labs ordered based on risk factors: laboratory profile, including lipid panel will be reviewed  11. Referral Coordination- follow-up urology in the spring  12. Care Plan- weight loss, heart healthy diet  13. Cognitive Assessment- alert and oriented, with normal affect. No cognitive dysfunction. 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   Allergies:  1) ! Niaspan (Niacin (Antihyperlipidemic))  Past History:   Past Medical History:  Reviewed history from 01/24/2010 and no changes required.  TOBACCO ABUSE  HYPERLIPIDEMIA (ICD-272.4)  HYPERTENSION (ICD-401.9)  RML Pulmonary Nodule 1.7 x 1.6 cm 12/02/07. secondary to carcinoid tumor  history of palpitations  Prostate cancer Inflammatory arthritis  Past Surgical History:  Reviewed history from 01/24/2010 and no changes required.  Appendectomy  S/P Vein stripping  right Vats, minithoracotomy with right middle lobe, lobectomy July 2009  prostate biopsy 2011   Family History:  Reviewed history from 11/09/2007 and no changes required.  Mother apparently died of complications of coma. Father deceased at age 22,  cause unknown. Denies any family history of cancer, heart disease.   Social History:  Reviewed history from 07/26/2009 and no changes required.  Patient is widowed, his wife passed away 8  years ago. He lives alone. He is retired from working in the [employer]. He has 3 children, only 1 daughter that lives in the area in [place], a son that lives in [place], and a son that lives in [place].   Alcohol use-no  Current Smoker 1 ppd >50 pk years- d/c tobacco 4-09   Review of Systems  Constitutional: Negative for fever, chills, activity change, appetite change and fatigue.  HENT: Negative for hearing loss, ear pain, congestion, rhinorrhea, sneezing, mouth sores, trouble swallowing, neck pain, neck stiffness, dental problem, voice change, sinus pressure and tinnitus.   Eyes: Negative for photophobia, pain, redness and visual disturbance.  Respiratory: Negative for apnea, cough,  choking, chest tightness, shortness of breath and wheezing.   Cardiovascular: Negative for chest pain, palpitations and leg swelling.  Gastrointestinal: Negative for nausea, vomiting, abdominal pain, diarrhea, constipation, blood in stool,  abdominal distention, anal bleeding and rectal pain.  Genitourinary: Negative for dysuria, urgency, frequency, hematuria, flank pain, decreased urine volume, discharge, penile swelling, scrotal swelling, difficulty urinating, genital sores and testicular pain.  Musculoskeletal: Negative for myalgias, back pain, joint swelling, arthralgias and gait problem.  Skin: Negative for color change, rash and wound.  Neurological: Negative for dizziness, tremors, seizures, syncope, facial asymmetry, speech difficulty, weakness, light-headedness, numbness and headaches.  Hematological: Negative for adenopathy. Does not bruise/bleed easily.  Psychiatric/Behavioral: Negative for suicidal ideas, hallucinations, behavioral problems, confusion, sleep disturbance, self-injury, dysphoric mood, decreased concentration and agitation. The patient is not nervous/anxious.        Objective:   Physical Exam  Constitutional: He appears well-developed and well-nourished.  HENT:  Head: Normocephalic and atraumatic.  Right Ear: External ear normal.  Left Ear: External ear normal.  Nose: Nose normal.  Mouth/Throat: Oropharynx is clear and moist.  Eyes: Conjunctivae normal and EOM are normal. Pupils are equal, round, and reactive to light. No scleral icterus.  Neck: Normal range of motion. Neck supple. No JVD present. No thyromegaly present.  Cardiovascular: Regular rhythm, normal heart sounds and intact distal pulses.  Exam reveals no gallop and no friction rub.   No murmur heard. Pulmonary/Chest: Effort normal and breath sounds normal. He exhibits no tenderness.  Abdominal: Soft. Bowel sounds are normal. He exhibits no distension and no mass. There is no tenderness.       Hepatic enlargement  Genitourinary: Penis normal. Guaiac negative stool.       Prostate +2 symmetrically enlarged  Musculoskeletal: Normal range of motion. He exhibits edema. He exhibits no tenderness.       +1 edema distal to the knee is   Lymphadenopathy:    He has no cervical adenopathy.  Neurological: He is alert. He has normal reflexes. No cranial nerve deficit. Coordination normal.  Skin: Skin is warm and dry. No rash noted.  Psychiatric: He has a normal mood and affect. His behavior is normal.          Assessment & Plan:    Preventive health examination Hepatomegaly History of elevated PSA   Medical regimen unchanged Recheck 6 months or when necessary Followup urology Followup rheumatology  Recheck here 6 months

## 2012-08-05 NOTE — Patient Instructions (Signed)
Limit your sodium (Salt) intake    It is important that you exercise regularly, at least 20 minutes 3 to 4 times per week.  If you develop chest pain or shortness of breath seek  medical attention.  You need to lose weight.  Consider a lower calorie diet and regular exercise.  Return in 6 months for follow-up  followup urology and rheumatology as scheduled

## 2012-08-12 ENCOUNTER — Telehealth: Payer: Self-pay | Admitting: Internal Medicine

## 2012-08-12 NOTE — Telephone Encounter (Signed)
Spoke to pt notified all lab work was normal. Pt verbalized understanding and asked copy of labs be mailed. Told pt will mail copy. Copy of labs mailed.

## 2012-08-12 NOTE — Telephone Encounter (Signed)
Please call/notify patient that lab/test/procedure is normal 

## 2012-08-12 NOTE — Telephone Encounter (Signed)
Pt would like blood work results from 08-05-12

## 2012-12-13 ENCOUNTER — Encounter: Payer: Self-pay | Admitting: Cardiovascular Disease

## 2012-12-13 ENCOUNTER — Ambulatory Visit (INDEPENDENT_AMBULATORY_CARE_PROVIDER_SITE_OTHER): Payer: 59 | Admitting: Cardiovascular Disease

## 2012-12-13 VITALS — BP 164/88 | HR 61 | Ht 71.5 in | Wt 250.8 lb

## 2012-12-13 DIAGNOSIS — R0600 Dyspnea, unspecified: Secondary | ICD-10-CM | POA: Insufficient documentation

## 2012-12-13 DIAGNOSIS — R0609 Other forms of dyspnea: Secondary | ICD-10-CM

## 2012-12-13 MED ORDER — POTASSIUM CHLORIDE ER 10 MEQ PO TBCR: 10.0000 meq | EXTENDED_RELEASE_TABLET | Freq: Every day | ORAL | Status: DC

## 2012-12-13 MED ORDER — HYDROCHLOROTHIAZIDE 25 MG PO TABS: 25.0000 mg | ORAL_TABLET | Freq: Every day | ORAL | Status: DC

## 2012-12-13 NOTE — Progress Notes (Signed)
John Riley Date of Birth  1932/05/18       [facility]    [place] [address]  [address] [place], [place]  [address]   [place], [place]  [address] [phone]     [phone]   Fax  [phone]    Fax [phone]  Problem List: 1. Dyspnea 2. Chest pain 3. Hyperlipidemia 4. Hypertension 5. Right lung nodule - s/p surgical removal Edwyna Shell)   History of Present Illness:  John Riley is a 79 yo who presents today for further evaluation of some episodes of CP and dyspnea that occurs with exertion.  He has had chest tightness / dyspnea  walking back from the mailbox.  No real chest pain.   This occurs almost everyvtime that he walk back from the mailbox ( which is an uphill grade)  This started when he was started on prednisone ( 4 months or so).    No PND or orthopnea.    He does not add salt but does eat some canned foods.   Current Outpatient Prescriptions on File Prior to Visit  Medication Sig Dispense Refill  . acetaminophen (TYLENOL) 325 MG tablet Take 650 mg by mouth every 6 (six) hours as needed.        Marland Kitchen atenolol (TENORMIN) 100 MG tablet Take 1 tablet (100 mg total) by mouth daily.  90 tablet  6  . lisinopril (PRINIVIL,ZESTRIL) 40 MG tablet Take 1 tablet (40 mg total) by mouth daily.  90 tablet  6  . predniSONE (DELTASONE) 10 MG tablet Take 5 mg by mouth daily.       . simvastatin (ZOCOR) 40 MG tablet Take 1 tablet (40 mg total) by mouth at bedtime.  90 tablet  6   No current facility-administered medications on file prior to visit.    Allergies  Allergen Reactions  . Niacin     Past Medical History  Diagnosis Date  . ELEVATED PROSTATE SPECIFIC ANTIGEN 07/31/2009  . HYPERLIPIDEMIA 06/28/2007  . HYPERTENSION 06/28/2007  . INSOMNIA 12/07/2007  . PULMONARY NODULE, RIGHT MIDDLE LOBE 12/07/2007  . TOBACCO ABUSE 11/09/2007  . Non-small cell lung cancer     Past Surgical History  Procedure Laterality Date  .  Appendectomy    . Vein ligation and stripping    . Lobectomy      right middle  . Prostate surgery      bx    History  Smoking status  . Former Smoker  . Quit date: 08/18/2007  Smokeless tobacco  . Never Used    History  Alcohol Use No    No family history on file.  Reviw of Systems:  Reviewed in the HPI.  All other systems are negative.  Physical Exam: Blood pressure 164/88, pulse 61, height 5' 11.5" (1.816 m), weight 250 lb 12.8 oz (113.762 kg). General: Well developed, well nourished, in no acute distress.  Head: Normocephalic, atraumatic, sclera non-icteric, mucus membranes are moist,   Neck: Supple. Carotids are 2 + without bruits. No JVD   Lungs: Clear   Heart: RR, S1, S2  Abdomen: Soft, non-tender, non-distended with normal bowel sounds.  Msk:  Strength and tone are normal   Extremities: No clubbing or cyanosis. No edema.  Distal pedal pulses are 2+ and equal    Neuro: CN II - XII intact.  Alert and oriented X 3.   Psych:  Normal   ECG: December 13, 2012:  NSR  at 61,  normal ECG  Assessment / Plan:

## 2012-12-13 NOTE — Assessment & Plan Note (Signed)
Mr. Curley presents today for further regulation of his dyspnea. He states that the shortness of breath started after he began prednisone therapy which is for his arthritis. His quite possible that the prednisone is causing him to retain fluid and causing some transient congestive heart failure.  He continues to eat some salt but does not add any salt. He does like to eat canned  foods.  We'll start him on HCTZ 25 mg a day as well as potassium chloride 10 mEq a day. This will also help with his mild hypertension.  We'll get an echocardiographic further evaluation of his LV function.   We'll also get a Lexiscan myoview  to rule out coronary ischemia. I seen again in 3 months for followup office visit.

## 2012-12-13 NOTE — Patient Instructions (Addendum)
START HYDROCHLOROTHIAZIDE 25 MG DAILY  START KDUR 10 MEQ DAILY  Your physician has requested that you have an echocardiogram. Echocardiography is a painless test that uses sound waves to create images of your heart. It provides your doctor with information about the size and shape of your heart and how well your heart's chambers and valves are working. This procedure takes approximately one hour. There are no restrictions for this procedure.  Your physician has requested that you have a lexiscan myoview. For further information please visit [email or web address] Please follow instruction sheet, as given.  Your physician recommends that you schedule a follow-up appointment in: 3 MONTH OV/BMET  DASH Diet The DASH diet stands for "Dietary Approaches to Stop Hypertension." It is a healthy eating plan that has been shown to reduce high blood pressure (hypertension) in as little as 14 days, while also possibly providing other significant health benefits. These other health benefits include reducing the risk of breast cancer after menopause and reducing the risk of type 2 diabetes, heart disease, colon cancer, and stroke. Health benefits also include weight loss and slowing kidney failure in patients with chronic kidney disease.  DIET GUIDELINES  Limit salt (sodium). Your diet should contain less than 1500 mg of sodium daily.  Limit refined or processed carbohydrates. Your diet should include mostly whole grains. Desserts and added sugars should be used sparingly.  Include small amounts of heart-healthy fats. These types of fats include nuts, oils, and tub margarine. Limit saturated and trans fats. These fats have been shown to be harmful in the body. CHOOSING FOODS  The following food groups are based on a 2000 calorie diet. See your Registered Dietitian for individual calorie needs. Grains and Grain Products (6 to 8 servings daily)  Eat More Often: Whole-wheat bread, brown rice, whole-grain or wheat  pasta, quinoa, popcorn without added fat or salt (air popped).  Eat Less Often: White bread, white pasta, white rice, cornbread. Vegetables (4 to 5 servings daily)  Eat More Often: Fresh, frozen, and canned vegetables. Vegetables may be raw, steamed, roasted, or grilled with a minimal amount of fat.  Eat Less Often/Avoid: Creamed or fried vegetables. Vegetables in a cheese sauce. Fruit (4 to 5 servings daily)  Eat More Often: All fresh, canned (in natural juice), or frozen fruits. Dried fruits without added sugar. One hundred percent fruit juice ( cup [237 mL] daily).  Eat Less Often: Dried fruits with added sugar. Canned fruit in light or heavy syrup. [employer], Fish, and Poultry (2 servings or less daily. One serving is 3 to 4 oz [85-114 g]).  Eat More Often: Ninety percent or leaner ground beef, tenderloin, sirloin. Round cuts of beef, chicken breast, [place] breast. All fish. Grill, bake, or broil your meat. Nothing should be fried.  Eat Less Often/Avoid: Fatty cuts of meat, [place], or chicken leg, thigh, or wing. Fried cuts of meat or fish. Dairy (2 to 3 servings)  Eat More Often: Low-fat or fat-free milk, low-fat plain or light yogurt, reduced-fat or part-skim cheese.  Eat Less Often/Avoid: Milk (whole, 2%).Whole milk yogurt. Full-fat cheeses. Nuts, Seeds, and Legumes (4 to 5 servings per week)  Eat More Often: All without added salt.  Eat Less Often/Avoid: Salted nuts and seeds, canned beans with added salt. Fats and Sweets (limited)  Eat More Often: Vegetable oils, tub margarines without trans fats, sugar-free gelatin. Mayonnaise and salad dressings.  Eat Less Often/Avoid: Coconut oils, palm oils, butter, stick margarine, cream, half and half, cookies, candy, pie. FOR  MORE INFORMATION The Dash Diet Eating Plan: www.dashdiet.org Document Released: 07/23/2011 Document Revised: 10/26/2011 Document Reviewed: 07/23/2011 [facility] Patient Information 2013 [place],  [place].

## 2012-12-16 ENCOUNTER — Telehealth: Payer: Self-pay | Admitting: *Deleted

## 2012-12-16 ENCOUNTER — Telehealth: Payer: Self-pay | Admitting: Cardiovascular Disease

## 2012-12-16 NOTE — Telephone Encounter (Signed)
msg left for daughter... Called pt and informed him he can eat bananas as he usually does and can drink oj. Pt will start med tomorrow. Also explained that he should schedule both test requested, teaching him that both give different information. A long conversation reviewed details or the tests. Pt will set up next test after the echo is complete/ pt expressed concern with length of time needed for myoview and fear of the test making him feel bad. Feeling associated with test were reviewed. Pt still hesitant.

## 2012-12-16 NOTE — Telephone Encounter (Signed)
Daughter called back and all was explained to her as stated prior.

## 2012-12-16 NOTE — Telephone Encounter (Signed)
New problem   Daughter calling .   Patient started on HCTZ. However has not started taken the medication due to reading the instruction . And from the instruction it stated do not eat banana nor drink orange juice . Daughter stated that her father does both on a regular basis .

## 2012-12-16 NOTE — Telephone Encounter (Signed)
12/16/12 I spoke with John Riley daughter John Riley today about scheduling his Myoview. At this time John Riley want to wait.His daughter will try to get him to schedule his Myoview and call me back. I will forward this message to the nurse.

## 2012-12-22 ENCOUNTER — Telehealth: Payer: Self-pay | Admitting: *Deleted

## 2012-12-22 NOTE — Telephone Encounter (Signed)
Daughter states pt stopped k+ because he feels it made him bloated. Pt continues to take HCTZ. Told daughter he is to take the two meds together or neither, long conversation about k+, she will try to get him to take both but will call back if further assistance needed.

## 2012-12-26 ENCOUNTER — Ambulatory Visit (HOSPITAL_COMMUNITY): Payer: Medicare PPO | Attending: Cardiology | Admitting: Radiology

## 2012-12-26 DIAGNOSIS — R0609 Other forms of dyspnea: Secondary | ICD-10-CM | POA: Insufficient documentation

## 2012-12-26 DIAGNOSIS — R0600 Dyspnea, unspecified: Secondary | ICD-10-CM

## 2012-12-26 DIAGNOSIS — I10 Essential (primary) hypertension: Secondary | ICD-10-CM | POA: Insufficient documentation

## 2012-12-26 DIAGNOSIS — R0989 Other specified symptoms and signs involving the circulatory and respiratory systems: Secondary | ICD-10-CM

## 2012-12-26 DIAGNOSIS — E669 Obesity, unspecified: Secondary | ICD-10-CM | POA: Insufficient documentation

## 2012-12-26 DIAGNOSIS — Z87891 Personal history of nicotine dependence: Secondary | ICD-10-CM | POA: Insufficient documentation

## 2012-12-26 DIAGNOSIS — C61 Malignant neoplasm of prostate: Secondary | ICD-10-CM | POA: Insufficient documentation

## 2012-12-26 DIAGNOSIS — E785 Hyperlipidemia, unspecified: Secondary | ICD-10-CM | POA: Insufficient documentation

## 2012-12-26 NOTE — Progress Notes (Signed)
Echocardiogram performed.  

## 2012-12-27 ENCOUNTER — Telehealth: Payer: Self-pay | Admitting: *Deleted

## 2012-12-27 NOTE — Telephone Encounter (Signed)
Message copied by Antony Odea on Tue Dec 27, 2012  4:46 PM ------      Message from: Vesta Mixer      Created: Tue Dec 27, 2012 10:03 AM       Normal LV function.  Dr. Jens Som reports a density in the right atrium.  I have reviewed the images and am not convinced that there is anything there.  We can discuss at next office visit.  TEE vs. Cardiac MRI. ------

## 2012-12-27 NOTE — Telephone Encounter (Addendum)
Results reviewed with pt/ needs f/u set, pt declined at original app to reschedule due to wanting his daughter to set app, msg left with daughter to call to set app. Pt still has not set lexiscan and is unsure if he wants it. Since stopping prednisone  he thinks his breathing is better. I have had several calls from his daughter voicing concerns that he doesn't  want the test and he is not taking meds, she has been trying to get him to accept both.   Pt states that about 5 years ago he had a lung nodule the size of a quarter removed from the right lung that was cancerous. Pt concerned with left atrium density.

## 2012-12-27 NOTE — Telephone Encounter (Signed)
I note that he is not taking meds and does not want the myoview.  i'm unclear about whether he wants to come back for apt.   He needs the test and medications that I have ordered.  He may need additional testing for this right atrial density ( would probably do a CT since he has had lung cancer).   I will be glad to see him if / when he wants to come back.

## 2012-12-27 NOTE — Telephone Encounter (Signed)
Spoke with daughter, app made to discuss echo/ meds/ lexiscan. She will come with him for the app.

## 2012-12-28 NOTE — Telephone Encounter (Signed)
Daughter called and left msg on my voice mail asking me to cancel up coming ov with Dr Elease Hashimoto, pt wants to be seen but requests it to be in July, per daughter she states she has tried to convince him to be seen sooner but  remains addiment about getting app for July. Scheduled app cancelled and new one made for 02/15/13. Daughter accepted.

## 2013-01-10 ENCOUNTER — Telehealth: Payer: Self-pay | Admitting: *Deleted

## 2013-01-10 NOTE — Telephone Encounter (Signed)
Daughter requested a copy of the last echo mailed to him, copy mailed today.

## 2013-01-11 ENCOUNTER — Ambulatory Visit: Payer: 59 | Admitting: Cardiovascular Disease

## 2013-02-03 ENCOUNTER — Ambulatory Visit (INDEPENDENT_AMBULATORY_CARE_PROVIDER_SITE_OTHER): Payer: Medicare PPO | Admitting: Internal Medicine

## 2013-02-03 ENCOUNTER — Encounter: Payer: Self-pay | Admitting: Internal Medicine

## 2013-02-03 VITALS — BP 130/72 | HR 63 | Temp 99.3°F | Resp 20 | Wt 253.0 lb

## 2013-02-03 DIAGNOSIS — R0609 Other forms of dyspnea: Secondary | ICD-10-CM

## 2013-02-03 DIAGNOSIS — E785 Hyperlipidemia, unspecified: Secondary | ICD-10-CM

## 2013-02-03 DIAGNOSIS — I10 Essential (primary) hypertension: Secondary | ICD-10-CM

## 2013-02-03 DIAGNOSIS — C3491 Malignant neoplasm of unspecified part of right bronchus or lung: Secondary | ICD-10-CM

## 2013-02-03 DIAGNOSIS — C3490 Malignant neoplasm of unspecified part of unspecified bronchus or lung: Secondary | ICD-10-CM

## 2013-02-03 DIAGNOSIS — R0600 Dyspnea, unspecified: Secondary | ICD-10-CM

## 2013-02-03 MED ORDER — ATENOLOL 100 MG PO TABS: 100.0000 mg | ORAL_TABLET | Freq: Every day | ORAL | Status: DC

## 2013-02-03 MED ORDER — LISINOPRIL 40 MG PO TABS: 40.0000 mg | ORAL_TABLET | Freq: Every day | ORAL | Status: DC

## 2013-02-03 MED ORDER — POTASSIUM CHLORIDE ER 10 MEQ PO TBCR: 10.0000 meq | EXTENDED_RELEASE_TABLET | Freq: Every day | ORAL | Status: DC

## 2013-02-03 MED ORDER — SIMVASTATIN 40 MG PO TABS: 40.0000 mg | ORAL_TABLET | Freq: Every day | ORAL | Status: DC

## 2013-02-03 NOTE — Patient Instructions (Signed)
Limit your sodium (Salt) intake    It is important that you exercise regularly, at least 20 minutes 3 to 4 times per week.  If you develop chest pain or shortness of breath seek  medical attention.  You need to lose weight.  Consider a lower calorie diet and regular exercise.  Return in 6 months for follow-up   

## 2013-02-03 NOTE — Progress Notes (Signed)
Subjective:    Patient ID: John Riley, male    DOB: 18-Feb-1932, 79 y.o.   MRN: [id number]  HPI  79 year old patient who is seen today for his biannual followup. He has hypertension and dyslipidemia. He has been seen by cardiology recently. He has a history of right middle lobe lung cancer and is status post resection. He is doing quite well. His only complaint is some pedal edema. He has been on diuretic therapy to take when necessary edema which he has not taken recently. Denies any cardiopulmonary complaints.  Wt Readings from Last 3 Encounters:  02/03/13 253 lb (114.76 kg)  12/13/12 250 lb 12.8 oz (113.762 kg)  08/05/12 240 lb (108.863 kg)    Past Medical History  Diagnosis Date  . ELEVATED PROSTATE SPECIFIC ANTIGEN 07/31/2009  . HYPERLIPIDEMIA 06/28/2007  . HYPERTENSION 06/28/2007  . INSOMNIA 12/07/2007  . PULMONARY NODULE, RIGHT MIDDLE LOBE 12/07/2007  . TOBACCO ABUSE 11/09/2007  . Non-small cell lung cancer     History   Social History  . Marital Status: Widowed    Spouse Name: N/A    Number of Children: N/A  . Years of Education: N/A   Occupational History  . Not on file.   Social History Main Topics  . Smoking status: Former Smoker    Quit date: 08/18/2007  . Smokeless tobacco: Never Used  . Alcohol Use: No  . Drug Use: No  . Sexually Active: Not on file   Other Topics Concern  . Not on file   Social History Narrative  . No narrative on file    Past Surgical History  Procedure Laterality Date  . Appendectomy    . Vein ligation and stripping    . Lobectomy      right middle  . Prostate surgery      bx    History reviewed. No pertinent family history.  Allergies  Allergen Reactions  . Niacin     Current Outpatient Prescriptions on File Prior to Visit  Medication Sig Dispense Refill  . acetaminophen (TYLENOL) 325 MG tablet Take 650 mg by mouth every 6 (six) hours as needed.        Marland Kitchen atenolol (TENORMIN) 100 MG tablet Take 1 tablet (100 mg  total) by mouth daily.  90 tablet  6  . lisinopril (PRINIVIL,ZESTRIL) 40 MG tablet Take 1 tablet (40 mg total) by mouth daily.  90 tablet  6  . simvastatin (ZOCOR) 40 MG tablet Take 1 tablet (40 mg total) by mouth at bedtime.  90 tablet  6  . hydrochlorothiazide (HYDRODIURIL) 25 MG tablet Take 1 tablet (25 mg total) by mouth daily.  30 tablet  5  . potassium chloride (K-DUR) 10 MEQ tablet Take 1 tablet (10 mEq total) by mouth daily.  30 tablet  5   No current facility-administered medications on file prior to visit.    BP 130/72  Pulse 63  Temp(Src) 99.3 F (37.4 C) (Oral)  Resp 20  Wt 253 lb (114.76 kg)  BMI 34.8 kg/m2  SpO2 94%     Review of Systems  Constitutional: Positive for unexpected weight change. Negative for fever, chills, appetite change and fatigue.  HENT: Negative for hearing loss, ear pain, congestion, sore throat, trouble swallowing, neck stiffness, dental problem, voice change and tinnitus.   Eyes: Negative for pain, discharge and visual disturbance.  Respiratory: Negative for cough, chest tightness, wheezing and stridor.   Cardiovascular: Positive for leg swelling. Negative for chest pain and palpitations.  Gastrointestinal: Negative for nausea, vomiting, abdominal pain, diarrhea, constipation, blood in stool and abdominal distention.  Genitourinary: Negative for urgency, hematuria, flank pain, discharge, difficulty urinating and genital sores.  Musculoskeletal: Negative for myalgias, back pain, joint swelling, arthralgias and gait problem.  Skin: Negative for rash.  Neurological: Negative for dizziness, syncope, speech difficulty, weakness, numbness and headaches.  Hematological: Negative for adenopathy. Does not bruise/bleed easily.  Psychiatric/Behavioral: Negative for behavioral problems and dysphoric mood. The patient is not nervous/anxious.        Objective:   Physical Exam  Constitutional: He is oriented to person, place, and time. He appears  well-developed.  HENT:  Head: Normocephalic.  Right Ear: External ear normal.  Left Ear: External ear normal.  Eyes: Conjunctivae and EOM are normal.  Neck: Normal range of motion.  Cardiovascular: Normal rate and normal heart sounds.   Pulmonary/Chest: Effort normal and breath sounds normal.  Abdominal: Bowel sounds are normal.  Musculoskeletal: Normal range of motion. He exhibits edema. He exhibits no tenderness.  Neurological: He is alert and oriented to person, place, and time.  Psychiatric: He has a normal mood and affect. His behavior is normal.          Assessment & Plan:   Hypertension well controlled Pedal edema. Resume hydrochlorothiazide as needed with potassium supplementation Dyslipidemia. Continue simvastatin. Recheck lipid panel in 6 months History of non-small cell lung cancer right middle lobe   Recheck 6 months All medications refilled

## 2013-02-15 ENCOUNTER — Ambulatory Visit: Payer: 59 | Admitting: Cardiovascular Disease

## 2013-08-07 ENCOUNTER — Encounter: Payer: Self-pay | Admitting: Internal Medicine

## 2013-08-07 ENCOUNTER — Ambulatory Visit (INDEPENDENT_AMBULATORY_CARE_PROVIDER_SITE_OTHER): Payer: Medicare PPO | Admitting: Internal Medicine

## 2013-08-07 VITALS — BP 140/98 | Temp 98.5°F | Ht 72.0 in | Wt 244.0 lb

## 2013-08-07 DIAGNOSIS — I10 Essential (primary) hypertension: Secondary | ICD-10-CM

## 2013-08-07 DIAGNOSIS — Z23 Encounter for immunization: Secondary | ICD-10-CM

## 2013-08-07 DIAGNOSIS — E785 Hyperlipidemia, unspecified: Secondary | ICD-10-CM

## 2013-08-07 DIAGNOSIS — R972 Elevated prostate specific antigen [PSA]: Secondary | ICD-10-CM

## 2013-08-07 DIAGNOSIS — Z0000 Encounter for general adult medical examination without abnormal findings: Secondary | ICD-10-CM

## 2013-08-07 DIAGNOSIS — F17200 Nicotine dependence, unspecified, uncomplicated: Secondary | ICD-10-CM

## 2013-08-07 DIAGNOSIS — C61 Malignant neoplasm of prostate: Secondary | ICD-10-CM

## 2013-08-07 LAB — CBC WITH DIFFERENTIAL/PLATELET
Basophils Relative: 0.2 % (ref 0.0–3.0)
Eosinophils Absolute: 0.2 10*3/uL (ref 0.0–0.7)
Eosinophils Relative: 1.6 % (ref 0.0–5.0)
Hemoglobin: 15.7 g/dL (ref 13.0–17.0)
MCHC: 33.7 g/dL (ref 30.0–36.0)
MCV: 94.3 fl (ref 78.0–100.0)
Monocytes Absolute: 0.8 10*3/uL (ref 0.1–1.0)
Neutro Abs: 7.3 10*3/uL (ref 1.4–7.7)
RBC: 4.96 Mil/uL (ref 4.22–5.81)

## 2013-08-07 LAB — LIPID PANEL
HDL: 33.8 mg/dL — ABNORMAL LOW (ref >39.00)
LDL Cholesterol: 38 mg/dL (ref 0–99)
Total CHOL/HDL Ratio: 3
Triglycerides: 183 mg/dL — ABNORMAL HIGH (ref 0.0–149.0)
VLDL: 36.6 mg/dL (ref 0.0–40.0)

## 2013-08-07 LAB — COMPREHENSIVE METABOLIC PANEL
ALT: 17 U/L (ref 0–53)
AST: 23 U/L (ref 0–37)
Alkaline Phosphatase: 58 U/L (ref 39–117)
Creatinine, Ser: 0.8 mg/dL (ref 0.4–1.5)
Total Bilirubin: 1.6 mg/dL — ABNORMAL HIGH (ref 0.3–1.2)

## 2013-08-07 NOTE — Progress Notes (Signed)
Pre visit review using our clinic review tool, if applicable. No additional management support is needed unless otherwise documented below in the visit note. 

## 2013-08-07 NOTE — Patient Instructions (Addendum)
Limit your sodium (Salt) intake  You need to lose weight.  Consider a lower calorie diet and regular exercise.    It is important that you exercise regularly, at least 20 minutes 3 to 4 times per week.  If you develop chest pain or shortness of breath seek  medical attention.  Return in 6 months for follow-up  Laboratory studies were obtained on your visit today and will be available in one or 2 business days.  IF there are any significant abnormalities, the office will call and discuss the results.  If there results are unremarkable, they will be available for your review after one week in MY CHART.  Thank you for enrolling in MyChart. Please follow the instructions below to securely access your online medical record. MyChart allows you to send messages to your doctor, view your test results, renew your prescriptions, schedule appointments, and more.  How Do I Sign Up? 1. In your Internet browser, go to [email or web address] 2. Click on the New  User? link in the Sign In box.  3. Enter your MyChart Access Code exactly as it appears below. You will not need to use this code after you have completed the sign-up process. If you do not sign up before the expiration date, you must request a new code. MyChart Access Code: [email or web address] Expires: 10/06/2013  9:28 AM  4. Enter the last four digits of your Social Security Number (xxxx) and Date of Birth (mm/dd/yyyy) as indicated and click Next. You will be taken to the next sign-up page. 5. Create a MyChart ID. This will be your MyChart login ID and cannot be changed, so think of one that is secure and easy to remember. 6. Create a MyChart password. You can change your password at any time. 7. Enter your Password Reset Question and Answer and click Next. This can be used at a later time if you forget your password.  8. Select your communication preference, and if applicable enter your e-mail address. You will receive e-mail  notification when new information is available in MyChart by choosing to receive e-mail notifications and filling in your e-mail. 9. Click Sign In. You can now view your medical record.   Additional Information If you have questions, you can email [email or web address] or call [phone] to talk to our MyChart staff. Remember, MyChart is NOT to be used for urgent needs. For medical emergencies, dial 911.

## 2013-08-07 NOTE — Progress Notes (Signed)
Patient ID: John Riley, male   DOB: 1932/07/19, 79 y.o.   MRN: [id number]  Subjective:    Patient ID: John Riley, male    DOB: 10-29-1931, 79 y.o.   MRN: [id number]  Hyperlipidemia Pertinent negatives include no chest pain, myalgias or shortness of breath.  Hypertension Pertinent negatives include no chest pain, headaches, neck pain, palpitations or shortness of breath.  Pre-visit discussion using our clinic review tool. No additional management support is needed unless otherwise documented below in the visit note.  79 -year-old patient who is seen today for a wellness exam. He is status post right middle lobe resection for a non-small cell lung cancer in 2009. He discontinued tobacco use in the spring of 2009. He has been followed by urology due to an elevated PSA. He had a prostate biopsy in 2011 that revealed low-grade prostate cancer. He is followed by urology annually.He has dyslipidemia and treated hypertension. He is doing quite well. He is a nondrinker but in the past had consumed beer  More recently he has been followed by Dr. Kellie Simmering do to an inflammatory arthritis.   Here for Medicare AWV:   1. Risk factors based on Past M, S, F history:cardiovascular risk factors include hypertension, and dyslipidemia  2. Physical Activities: no activity restrictions, although fairly sedentary  3. Depression/mood: no history depression, or mood disorder  4. Hearing: mildly impaired  5. ADL's: independent in all aspects of daily living. Lives independently  6. Fall Risk: low  7. Home Safety: no problems identified  8. Height, weight, &visual acuity:height and weight stable. No difficulty with visual acuity  9. Counseling: heart healthy diet, exercise, weight loss. All encouraged  10. Labs ordered based on risk factors: laboratory profile, including lipid panel will be reviewed  11. Referral Coordination- follow-up urology in the spring  12. Care Plan- weight loss, heart healthy diet   13. Cognitive Assessment- alert and oriented, with normal affect. No cognitive dysfunction. 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   Allergies:  1) ! 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 Father deceased at age 69,  cause unknown. Denies any family history of cancer, heart disease.   Social History:  Reviewed history from 07/26/2009 and no changes required.  Patient is widowed, his wife passed away 8  years ago. He lives alone. He is retired from working in the [employer]. He has 3 children, only 1 daughter that lives in the area in [place], a son that lives in [place], and a son that lives in [place].   Alcohol use-no  Current Smoker 1 ppd >50 pk years- d/c tobacco 4-09   Review of Systems  Constitutional: Negative for fever, chills, activity change, appetite change and fatigue.  HENT: Negative for congestion, dental problem, ear pain, hearing loss, mouth sores, rhinorrhea, sinus pressure, sneezing, tinnitus, trouble swallowing and voice change.   Eyes: Negative for photophobia, pain, redness and visual disturbance.  Respiratory: Negative for apnea, cough, choking, chest tightness, shortness of breath and wheezing.   Cardiovascular: Negative for  chest pain, palpitations and leg swelling.  Gastrointestinal: Negative for nausea, vomiting, abdominal pain, diarrhea, constipation, blood in stool, abdominal distention, anal bleeding and rectal pain.  Genitourinary: Negative for  dysuria, urgency, frequency, hematuria, flank pain, decreased urine volume, discharge, penile swelling, scrotal swelling, difficulty urinating, genital sores and testicular pain.  Musculoskeletal: Negative for arthralgias, back pain, gait problem, joint swelling, myalgias, neck pain and neck stiffness.  Skin: Negative for color change, rash and wound.  Neurological: Negative for dizziness, tremors, seizures, syncope, facial asymmetry, speech difficulty, weakness, light-headedness, numbness and headaches.  Hematological: Negative for adenopathy. Does not bruise/bleed easily.  Psychiatric/Behavioral: Negative for suicidal ideas, hallucinations, behavioral problems, confusion, sleep disturbance, self-injury, dysphoric mood, decreased concentration and agitation. The patient is not nervous/anxious.        Objective:   Physical Exam  Constitutional: He appears well-developed and well-nourished.  HENT:  Head: Normocephalic and atraumatic.  Right Ear: External ear normal.  Left Ear: External ear normal.  Nose: Nose normal.  Mouth/Throat: Oropharynx is clear and moist.  Pharyngeal crowding  Eyes: Conjunctivae and EOM are normal. Pupils are equal, round, and reactive to light. No scleral icterus.  Neck: Normal range of motion. Neck supple. No JVD present. No thyromegaly present.  Cardiovascular: Regular rhythm, normal heart sounds and intact distal pulses.  Exam reveals no gallop and no friction rub.   No murmur heard. Pulmonary/Chest: Effort normal and breath sounds normal. He exhibits no tenderness.  Abdominal: Soft. Bowel sounds are normal. He exhibits no distension and no mass. There is no tenderness.  Hepatic enlargement noted last year but not obvious today  Genitourinary: Penis normal. Guaiac negative stool.  Prostate +2 symmetrically enlarged  Musculoskeletal: Normal range of motion. He exhibits edema. He exhibits no tenderness.  +1 edema distal to the knee is  Lymphadenopathy:    He  has no cervical adenopathy.  Neurological: He is alert. He has normal reflexes. No cranial nerve deficit. Coordination normal.  Skin: Skin is warm and dry. No rash noted.  Psychiatric: He has a normal mood and affect. His behavior is normal.          Assessment & Plan:    Preventive health examination Hepatomegaly, h/o- normal exam today History of elevated PSA/prostate cancer; status post biopsy 2011 revealing low-grade prostate cancer   Medical regimen unchanged Recheck 6 months or when necessary Followup urology Followup rheumatology  Recheck here 6 months

## 2013-08-15 ENCOUNTER — Other Ambulatory Visit: Payer: Self-pay | Admitting: Internal Medicine

## 2013-08-15 DIAGNOSIS — R972 Elevated prostate specific antigen [PSA]: Secondary | ICD-10-CM

## 2013-08-15 DIAGNOSIS — C61 Malignant neoplasm of prostate: Secondary | ICD-10-CM

## 2014-02-09 ENCOUNTER — Ambulatory Visit (INDEPENDENT_AMBULATORY_CARE_PROVIDER_SITE_OTHER): Payer: Medicare PPO | Admitting: Internal Medicine

## 2014-02-09 ENCOUNTER — Encounter: Payer: Self-pay | Admitting: Internal Medicine

## 2014-02-09 VITALS — BP 140/80 | HR 66 | Temp 98.7°F | Resp 20 | Ht 72.0 in | Wt 249.0 lb

## 2014-02-09 DIAGNOSIS — N183 Chronic kidney disease, stage 3 unspecified: Secondary | ICD-10-CM

## 2014-02-09 DIAGNOSIS — R7302 Impaired glucose tolerance (oral): Secondary | ICD-10-CM

## 2014-02-09 DIAGNOSIS — R972 Elevated prostate specific antigen [PSA]: Secondary | ICD-10-CM

## 2014-02-09 DIAGNOSIS — E785 Hyperlipidemia, unspecified: Secondary | ICD-10-CM

## 2014-02-09 DIAGNOSIS — I10 Essential (primary) hypertension: Secondary | ICD-10-CM

## 2014-02-09 DIAGNOSIS — R7309 Other abnormal glucose: Secondary | ICD-10-CM

## 2014-02-09 DIAGNOSIS — C61 Malignant neoplasm of prostate: Secondary | ICD-10-CM

## 2014-02-09 MED ORDER — LISINOPRIL 40 MG PO TABS: 40.0000 mg | ORAL_TABLET | Freq: Every day | ORAL | Status: DC

## 2014-02-09 MED ORDER — ATENOLOL 100 MG PO TABS: 100.0000 mg | ORAL_TABLET | Freq: Every day | ORAL | Status: DC

## 2014-02-09 MED ORDER — SIMVASTATIN 40 MG PO TABS: 40.0000 mg | ORAL_TABLET | Freq: Every day | ORAL | Status: DC

## 2014-02-09 NOTE — Progress Notes (Signed)
Pre-visit discussion using our clinic review tool. No additional management support is needed unless otherwise documented below in the visit note.  

## 2014-02-09 NOTE — Progress Notes (Signed)
Subjective:    Patient ID: John Riley, male    DOB: 12/04/1931, 80 y.o.   MRN: [id number]  HPI  80 year old patient who is seen today for his biannual followup.  He has hypertension and dyslipidemia.  He continues to do well without concerns or complaints today.  6 months ago at the time of his annual physical.  He was noted to have a elevated PSA that has worsened.  He is status post biopsy proven prostate cancer with low Gleason  Score.  It was recommended that he follow up [facility] last [facility], but he declines.  States that he feels well and does not desire further treatment.  Denies any bone pain  Past Medical History  Diagnosis Date  . ELEVATED PROSTATE SPECIFIC ANTIGEN 07/31/2009  . HYPERLIPIDEMIA 06/28/2007  . HYPERTENSION 06/28/2007  . INSOMNIA 12/07/2007  . PULMONARY NODULE, RIGHT MIDDLE LOBE 12/07/2007  . TOBACCO ABUSE 11/09/2007  . Non-small cell lung cancer     History   Social History  . Marital Status: Widowed    Spouse Name: N/A    Number of Children: N/A  . Years of Education: N/A   Occupational History  . Not on file.   Social History Main Topics  . Smoking status: Former Smoker    Quit date: 08/18/2007  . Smokeless tobacco: Never Used  . Alcohol Use: No  . Drug Use: No  . Sexual Activity: Not on file   Other Topics Concern  . Not on file   Social History Narrative  . No narrative on file    Past Surgical History  Procedure Laterality Date  . Appendectomy    . Vein ligation and stripping    . Lobectomy      right middle  . Prostate surgery      bx    History reviewed. No pertinent family history.  Allergies  Allergen Reactions  . Niacin     Current Outpatient Prescriptions on File Prior to Visit  Medication Sig Dispense Refill  . acetaminophen (TYLENOL) 325 MG tablet Take 650 mg by mouth every 6 (six) hours as needed.         No current facility-administered medications on file prior to visit.    BP 140/80  Pulse 66   Temp(Src) 98.7 F (37.1 C) (Oral)  Resp 20  Ht 6' (1.829 m)  Wt 249 lb (112.946 kg)  BMI 33.76 kg/m2  SpO2 94%       Review of Systems  Constitutional: Negative for fever, chills, appetite change and fatigue.  HENT: Negative for congestion, dental problem, ear pain, hearing loss, sore throat, tinnitus, trouble swallowing and voice change.   Eyes: Negative for pain, discharge and visual disturbance.  Respiratory: Negative for cough, chest tightness, wheezing and stridor.   Cardiovascular: Negative for chest pain, palpitations and leg swelling.  Gastrointestinal: Negative for nausea, vomiting, abdominal pain, diarrhea, constipation, blood in stool and abdominal distention.  Genitourinary: Negative for urgency, hematuria, flank pain, discharge, difficulty urinating and genital sores.  Musculoskeletal: Negative for arthralgias, back pain, gait problem, joint swelling, myalgias and neck stiffness.  Skin: Negative for rash.  Neurological: Negative for dizziness, syncope, speech difficulty, weakness, numbness and headaches.  Hematological: Negative for adenopathy. Does not bruise/bleed easily.  Psychiatric/Behavioral: Negative for behavioral problems and dysphoric mood. The patient is not nervous/anxious.        Objective:   Physical Exam  Constitutional: He is oriented to person, place, and time. He appears well-developed.  HENT:  Head: Normocephalic.  Right Ear: External ear normal.  Left Ear: External ear normal.  Eyes: Conjunctivae and EOM are normal.  Neck: Normal range of motion.  Cardiovascular: Normal rate and normal heart sounds.   Pulmonary/Chest: Breath sounds normal.  Abdominal: Bowel sounds are normal.  Musculoskeletal: Normal range of motion. He exhibits edema. He exhibits no tenderness.  Neurological: He is alert and oriented to person, place, and time.  Psychiatric: He has a normal mood and affect. His behavior is normal.          Assessment & Plan:    Hypertension well controlled.  Blood pressure 120/80 Dyslipidemia.  Continue statin therapy Prostate cancer with increasing PSA.  We'll recheck in 6 months at the time of his annual.  We'll discuss urologic referral again at that time Impaired glucose tolerance.  Weight loss encouraged  CPX 6 months

## 2014-02-09 NOTE — Patient Instructions (Signed)
Limit your sodium (Salt) intake    It is important that you exercise regularly, at least 20 minutes 3 to 4 times per week.  If you develop chest pain or shortness of breath seek  medical attention.  You need to lose weight.  Consider a lower calorie diet and regular exercise.  Return in 6 months for follow-up   

## 2014-02-12 ENCOUNTER — Telehealth: Payer: Self-pay | Admitting: Internal Medicine

## 2014-02-12 NOTE — Telephone Encounter (Signed)
Relevant patient education mailed to patient.  

## 2014-08-13 ENCOUNTER — Ambulatory Visit (INDEPENDENT_AMBULATORY_CARE_PROVIDER_SITE_OTHER): Payer: Commercial Managed Care - HMO | Admitting: Internal Medicine

## 2014-08-13 ENCOUNTER — Encounter: Payer: Self-pay | Admitting: Internal Medicine

## 2014-08-13 VITALS — BP 150/90 | HR 63 | Temp 98.0°F | Resp 20 | Ht 72.0 in | Wt 248.0 lb

## 2014-08-13 DIAGNOSIS — Z0000 Encounter for general adult medical examination without abnormal findings: Secondary | ICD-10-CM

## 2014-08-13 DIAGNOSIS — I10 Essential (primary) hypertension: Secondary | ICD-10-CM

## 2014-08-13 DIAGNOSIS — R972 Elevated prostate specific antigen [PSA]: Secondary | ICD-10-CM

## 2014-08-13 DIAGNOSIS — E785 Hyperlipidemia, unspecified: Secondary | ICD-10-CM

## 2014-08-13 DIAGNOSIS — C61 Malignant neoplasm of prostate: Secondary | ICD-10-CM

## 2014-08-13 DIAGNOSIS — R7302 Impaired glucose tolerance (oral): Secondary | ICD-10-CM

## 2014-08-13 LAB — CBC WITH DIFFERENTIAL/PLATELET
BASOS ABS: 0 10*3/uL (ref 0.0–0.1)
Basophils Relative: 0.5 % (ref 0.0–3.0)
EOS PCT: 1.3 % (ref 0.0–5.0)
Eosinophils Absolute: 0.1 10*3/uL (ref 0.0–0.7)
HCT: 45.8 % (ref 39.0–52.0)
HEMOGLOBIN: 15.5 g/dL (ref 13.0–17.0)
Lymphocytes Relative: 14.8 % (ref 12.0–46.0)
Lymphs Abs: 1.2 10*3/uL (ref 0.7–4.0)
MCHC: 33.8 g/dL (ref 30.0–36.0)
MCV: 94.7 fl (ref 78.0–100.0)
MONO ABS: 0.7 10*3/uL (ref 0.1–1.0)
MONOS PCT: 8.8 % (ref 3.0–12.0)
NEUTROS PCT: 74.6 % (ref 43.0–77.0)
Neutro Abs: 6.1 10*3/uL (ref 1.4–7.7)
PLATELETS: 162 10*3/uL (ref 150.0–400.0)
RBC: 4.84 Mil/uL (ref 4.22–5.81)
RDW: 11.9 % (ref 11.5–15.5)
WBC: 8.1 10*3/uL (ref 4.0–10.5)

## 2014-08-13 LAB — COMPREHENSIVE METABOLIC PANEL
ALK PHOS: 52 U/L (ref 39–117)
ALT: 19 U/L (ref 0–53)
AST: 23 U/L (ref 0–37)
Albumin: 4 g/dL (ref 3.5–5.2)
BUN: 18 mg/dL (ref 6–23)
CO2: 30 mEq/L (ref 19–32)
Calcium: 9.1 mg/dL (ref 8.4–10.5)
Chloride: 104 mEq/L (ref 96–112)
Creatinine, Ser: 0.7 mg/dL (ref 0.4–1.5)
GFR: 108.13 mL/min (ref >60.00)
Glucose, Bld: 116 mg/dL — ABNORMAL HIGH (ref 70–99)
Potassium: 4.8 mEq/L (ref 3.5–5.1)
Sodium: 140 mEq/L (ref 135–145)
Total Bilirubin: 1.8 mg/dL — ABNORMAL HIGH (ref 0.2–1.2)
Total Protein: 7.1 g/dL (ref 6.0–8.3)

## 2014-08-13 LAB — LIPID PANEL
CHOL/HDL RATIO: 3
Cholesterol: 106 mg/dL (ref 0–200)
HDL: 35.3 mg/dL — ABNORMAL LOW (ref >39.00)
LDL Cholesterol: 40 mg/dL (ref 0–99)
NONHDL: 70.7
Triglycerides: 152 mg/dL — ABNORMAL HIGH (ref 0.0–149.0)
VLDL: 30.4 mg/dL (ref 0.0–40.0)

## 2014-08-13 LAB — PSA: PSA: 13.84 ng/mL — ABNORMAL HIGH (ref 0.10–4.00)

## 2014-08-13 LAB — TSH: TSH: 1.02 u[IU]/mL (ref 0.35–4.50)

## 2014-08-13 MED ORDER — LISINOPRIL 40 MG PO TABS: 40.0000 mg | ORAL_TABLET | Freq: Every day | ORAL | Status: DC

## 2014-08-13 MED ORDER — SIMVASTATIN 40 MG PO TABS: 40.0000 mg | ORAL_TABLET | Freq: Every day | ORAL | Status: DC

## 2014-08-13 MED ORDER — ATENOLOL 100 MG PO TABS: 100.0000 mg | ORAL_TABLET | Freq: Every day | ORAL | Status: DC

## 2014-08-13 NOTE — Progress Notes (Signed)
Patient ID: John Riley, male   DOB: [birth year], 80 y.o.   MRN: [id number]  Subjective:    Patient ID: John Riley, male    DOB: May 20, 1932, 80 y.o.   MRN: [id number]  Hyperlipidemia Pertinent negatives include no chest pain, myalgias or shortness of breath.  Hypertension Pertinent negatives include no chest pain, headaches, neck pain, palpitations or shortness of breath.  Pre-visit discussion using our clinic review tool. No additional management support is needed unless otherwise documented below in the visit note.  82 -year-old patient who is seen today for a wellness exam.  He is status post right middle lobe resection for a non-small cell lung cancer in 2009. He discontinued tobacco use in the spring of 2009. He has been followed by urology due to an elevated PSA. He had a prostate biopsy in 2011 that revealed low-grade prostate cancer. He has dyslipidemia and treated hypertension. He is doing quite well. He is a nondrinker but in the past had consumed beer  He has been followed by Dr. Charlestine Night due  to an inflammatory arthritis.   Here for Medicare AWV:   1. Risk factors based on Past M, S, F history:cardiovascular risk factors include hypertension, and dyslipidemia  2. Physical Activities: no activity restrictions, although fairly sedentary  3. Depression/mood: no history depression, or mood disorder  4. Hearing: mildly impaired  5. ADL's: independent in all aspects of daily living. Lives independently  6. Fall Risk: low  7. Home Safety: no problems identified  8. Height, weight, &visual acuity:height and weight stable. No difficulty with visual acuity  9. Counseling: heart healthy diet, exercise, weight loss. All encouraged  10. Labs ordered based on risk factors: laboratory profile, including lipid panel will be reviewed  11. Referral Coordination- follow-up urology in the spring  12. Care Plan- weight loss, heart healthy diet  13. Cognitive Assessment- alert and  oriented, with normal affect. No cognitive dysfunction. 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  14.  Preventive services will include annual health examinations.  Screening laboratory data and serial PSAs will be reviewed; Patient was provided with a written and personalized care plan 15.  Provider list updated.  Includes primary care urology and rheumatology   Allergies:  1) ! 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 Father deceased at age 62,  cause unknown. Denies any family history of cancer, heart disease.   Social History:   Patient is widowed, his wife passed away 9  years ago. He lives alone. He is retired from working in the [employer]. He has 3 children, only 1 daughter that lives in the area in [place], a son that lives in [place], and a son that lives in [place].   Alcohol use-no  Current Smoker 1 ppd >50 pk years- d/c tobacco 4-09   Review of Systems  Constitutional: Negative for fever, chills, activity change, appetite change and fatigue.  HENT: Negative for congestion, dental problem, ear pain, hearing loss, mouth sores, rhinorrhea, sinus pressure, sneezing, tinnitus, trouble swallowing and voice change.  Eyes: Negative for photophobia, pain, redness and visual disturbance.  Respiratory: Negative for apnea, cough, choking, chest tightness, shortness of breath and wheezing.   Cardiovascular: Negative for chest pain, palpitations and leg swelling.   Gastrointestinal: Negative for nausea, vomiting, abdominal pain, diarrhea, constipation, blood in stool, abdominal distention, anal bleeding and rectal pain.  Genitourinary: Negative for dysuria, urgency, frequency, hematuria, flank pain, decreased urine volume, discharge, penile swelling, scrotal swelling, difficulty urinating, genital sores and testicular pain.  Musculoskeletal: Negative for myalgias, back pain, joint swelling, arthralgias, gait problem, neck pain and neck stiffness.  Skin: Negative for color change, rash and wound.  Neurological: Negative for dizziness, tremors, seizures, syncope, facial asymmetry, speech difficulty, weakness, light-headedness, numbness and headaches.  Hematological: Negative for adenopathy. Does not bruise/bleed easily.  Psychiatric/Behavioral: Negative for suicidal ideas, hallucinations, behavioral problems, confusion, sleep disturbance, self-injury, dysphoric mood, decreased concentration and agitation. The patient is not nervous/anxious.        Objective:   Physical Exam  Constitutional: He appears well-developed and well-nourished.  Obese Repeat blood pressure 140/80  HENT:  Head: Normocephalic and atraumatic.  Right Ear: External ear normal.  Left Ear: External ear normal.  Nose: Nose normal.  Mouth/Throat: Oropharynx is clear and moist.  Eyes: Conjunctivae and EOM are normal. Pupils are equal, round, and reactive to light. No scleral icterus.  Neck: Normal range of motion. Neck supple. No JVD present. No thyromegaly present.  Cardiovascular: Regular rhythm, normal heart sounds and intact distal pulses.  Exam reveals no gallop and no friction rub.   No murmur heard. Pulmonary/Chest: Effort normal and breath sounds normal. He exhibits no tenderness.  Abdominal: Soft. Bowel sounds are normal. He exhibits no distension and no mass. There is no tenderness.  Genitourinary: Penis normal. Guaiac negative stool.  Prostate minimally enlarged   Musculoskeletal: Normal range of motion. He exhibits edema. He exhibits no tenderness.  Plus 2 ankle and peel edema  Lymphadenopathy:    He has no cervical adenopathy.  Neurological: He is alert. He has normal reflexes. No cranial nerve deficit. Coordination normal.  Skin: Skin is warm and dry. No rash noted.  Psychiatric: He has a normal mood and affect. His behavior is normal.          Assessment & Plan:    Preventive health examination  History of elevated PSA/prostate cancer; status post biopsy 2011 revealing low-grade prostate cancer   Medical regimen unchanged Recheck 6 months or when necessary Followup urology Followup rheumatology  Will review.  Follow-up lab including PSA.  Patient very reluctant to consider treatment of prostate cancer or even urological follow-up  Recheck here 6 months

## 2014-08-13 NOTE — Progress Notes (Signed)
Pre visit review using our clinic review tool, if applicable. No additional management support is needed unless otherwise documented below in the visit note. 

## 2014-08-13 NOTE — Patient Instructions (Signed)
Limit your sodium (Salt) intake  Please check your blood pressure on a regular basis.  If it is consistently greater than 150/90, please make an office appointment.    It is important that you exercise regularly, at least 20 minutes 3 to 4 times per week.  If you develop chest pain or shortness of breath seek  medical attention.  You need to lose weight.  Consider a lower calorie diet and regular exercise.  Annual urology follow-up  Health Maintenance A healthy lifestyle and preventative care can promote health and wellness.  Maintain regular health, dental, and eye exams.  Eat a healthy diet. Foods like vegetables, fruits, whole grains, low-fat dairy products, and lean protein foods contain the nutrients you need and are low in calories. Decrease your intake of foods high in solid fats, added sugars, and salt. Get information about a proper diet from your health care provider, if necessary.  Regular physical exercise is one of the most important things you can do for your health. Most adults should get at least 150 minutes of moderate-intensity exercise (any activity that increases your heart rate and causes you to sweat) each week. In addition, most adults need muscle-strengthening exercises on 2 or more days a week.   Maintain a healthy weight. The body mass index (BMI) is a screening tool to identify possible weight problems. It provides an estimate of body fat based on height and weight. Your health care provider can find your BMI and can help you achieve or maintain a healthy weight. For males 20 years and older:  A BMI below 18.5 is considered underweight.  A BMI of 18.5 to 24.9 is normal.  A BMI of 25 to 29.9 is considered overweight.  A BMI of 30 and above is considered obese.  Maintain normal blood lipids and cholesterol by exercising and minimizing your intake of saturated fat. Eat a balanced diet with plenty of fruits and vegetables. Blood tests for lipids and cholesterol  should begin at age 75 and be repeated every 5 years. If your lipid or cholesterol levels are high, you are over age 30, or you are at high risk for heart disease, you may need your cholesterol levels checked more frequently.Ongoing high lipid and cholesterol levels should be treated with medicines if diet and exercise are not working.  If you smoke, find out from your health care provider how to quit. If you do not use tobacco, do not start.  Lung cancer screening is recommended for adults aged 32-80 years who are at high risk for developing lung cancer because of a history of smoking. A yearly low-dose CT scan of the lungs is recommended for people who have at least a 30-pack-year history of smoking and are current smokers or have quit within the past 15 years. A pack year of smoking is smoking an average of 1 pack of cigarettes a day for 1 year (for example, a 30-pack-year history of smoking could mean smoking 1 pack a day for 30 years or 2 packs a day for 15 years). Yearly screening should continue until the smoker has stopped smoking for at least 15 years. Yearly screening should be stopped for people who develop a health problem that would prevent them from having lung cancer treatment.  If you choose to drink alcohol, do not have more than 2 drinks per day. One drink is considered to be 12 oz (360 mL) of beer, 5 oz (150 mL) of wine, or 1.5 oz (45 mL) of  liquor.  Avoid the use of street drugs. Do not share needles with anyone. Ask for help if you need support or instructions about stopping the use of drugs.  High blood pressure causes heart disease and increases the risk of stroke. Blood pressure should be checked at least every 1-2 years. Ongoing high blood pressure should be treated with medicines if weight loss and exercise are not effective.  If you are 2-85 years old, ask your health care provider if you should take aspirin to prevent heart disease.  Diabetes screening involves taking a  blood sample to check your fasting blood sugar level. This should be done once every 3 years after age 3 if you are at a normal weight and without risk factors for diabetes. Testing should be considered at a younger age or be carried out more frequently if you are overweight and have at least 1 risk factor for diabetes.  Colorectal cancer can be detected and often prevented. Most routine colorectal cancer screening begins at the age of 29 and continues through age 38. However, your health care provider may recommend screening at an earlier age if you have risk factors for colon cancer. On a yearly basis, your health care provider may provide home test kits to check for hidden blood in the stool. A small camera at the end of a tube may be used to directly examine the colon (sigmoidoscopy or colonoscopy) to detect the earliest forms of colorectal cancer. Talk to your health care provider about this at age 54 when routine screening begins. A direct exam of the colon should be repeated every 5-10 years through age 36, unless early forms of precancerous polyps or small growths are found.  People who are at an increased risk for hepatitis B should be screened for this virus. You are considered at high risk for hepatitis B if:  You were born in a country where hepatitis B occurs often. Talk with your health care provider about which countries are considered high risk.  Your parents were born in a high-risk country and you have not received a shot to protect against hepatitis B (hepatitis B vaccine).  You have HIV or AIDS.  You use needles to inject street drugs.  You live with, or have sex with, someone who has hepatitis B.  You are a man who has sex with other men (MSM).  You get hemodialysis treatment.  You take certain medicines for conditions like cancer, organ transplantation, and autoimmune conditions.  Hepatitis C blood testing is recommended for all people born from [birth year] through 1963 and any  individual with known risk factors for hepatitis C.  Healthy men should no longer receive prostate-specific antigen (PSA) blood tests as part of routine cancer screening. Talk to your health care provider about prostate cancer screening.  Testicular cancer screening is not recommended for adolescents or adult males who have no symptoms. Screening includes self-exam, a health care provider exam, and other screening tests. Consult with your health care provider about any symptoms you have or any concerns you have about testicular cancer.  Practice safe sex. Use condoms and avoid high-risk sexual practices to reduce the spread of sexually transmitted infections (STIs).  You should be screened for STIs, including gonorrhea and chlamydia if:  You are sexually active and are younger than 24 years.  You are older than 24 years, and your health care provider tells you that you are at risk for this type of infection.  Your sexual activity has changed  since you were last screened, and you are at an increased risk for chlamydia or gonorrhea. Ask your health care provider if you are at risk.  If you are at risk of being infected with HIV, it is recommended that you take a prescription medicine daily to prevent HIV infection. This is called pre-exposure prophylaxis (PrEP). You are considered at risk if:  You are a man who has sex with other men (MSM).  You are a heterosexual man who is sexually active with multiple partners.  You take drugs by injection.  You are sexually active with a partner who has HIV.  Talk with your health care provider about whether you are at high risk of being infected with HIV. If you choose to begin PrEP, you should first be tested for HIV. You should then be tested every 3 months for as long as you are taking PrEP.  Use sunscreen. Apply sunscreen liberally and repeatedly throughout the day. You should seek shade when your shadow is shorter than you. Protect yourself by  wearing long sleeves, pants, a wide-brimmed hat, and sunglasses year round whenever you are outdoors.  Tell your health care provider of new moles or changes in moles, especially if there is a change in shape or color. Also, tell your health care provider if a mole is larger than the size of a pencil eraser.  A one-time screening for abdominal aortic aneurysm (AAA) and surgical repair of large AAAs by ultrasound is recommended for men aged 81-75 years who are current or former smokers.  Stay current with your vaccines (immunizations). Document Released: 01/30/2008 Document Revised: 08/08/2013 Document Reviewed: 12/29/2010 [employer] Patient Information 2015 [place], [place]. This information is not intended to replace advice given to you by your health care provider. Make sure you discuss any questions you have with your health care provider.

## 2014-08-21 ENCOUNTER — Telehealth: Payer: Self-pay | Admitting: *Deleted

## 2014-08-21 NOTE — Telephone Encounter (Signed)
Please call/notify patient that lab/test/procedure is normal; cholesterol remains quite low at 106;  PSA is elevated in the same range as one year ago

## 2014-08-21 NOTE — Telephone Encounter (Signed)
Pt calling for lab results from last week, please advise.

## 2014-08-21 NOTE — Telephone Encounter (Signed)
Spoke to pt, told him labs normal: Cholesterol remains quite low at 106 and PSA elevated in the same range as one year ago per Dr. Raliegh Ip. Told pt will put copy of labs in mail tomorrow. Pt verbalized understanding. Labs mailed.

## 2014-08-21 NOTE — Telephone Encounter (Signed)
Patient called stating he had a physical last week and has not been informed of the results and asked that a copy be mailed to his home address.  Patient's phone number is [phone].

## 2015-02-08 ENCOUNTER — Encounter: Payer: Self-pay | Admitting: Internal Medicine

## 2015-02-08 ENCOUNTER — Ambulatory Visit (INDEPENDENT_AMBULATORY_CARE_PROVIDER_SITE_OTHER): Payer: Commercial Managed Care - HMO | Admitting: Internal Medicine

## 2015-02-08 VITALS — BP 118/60 | HR 59 | Temp 99.0°F | Resp 20 | Ht 72.0 in | Wt 242.0 lb

## 2015-02-08 DIAGNOSIS — I10 Essential (primary) hypertension: Secondary | ICD-10-CM | POA: Diagnosis not present

## 2015-02-08 DIAGNOSIS — E785 Hyperlipidemia, unspecified: Secondary | ICD-10-CM | POA: Diagnosis not present

## 2015-02-08 DIAGNOSIS — R7302 Impaired glucose tolerance (oral): Secondary | ICD-10-CM

## 2015-02-08 MED ORDER — ATENOLOL 100 MG PO TABS: 100.0000 mg | ORAL_TABLET | Freq: Every day | ORAL | Status: DC

## 2015-02-08 MED ORDER — SIMVASTATIN 40 MG PO TABS: 40.0000 mg | ORAL_TABLET | Freq: Every day | ORAL | Status: DC

## 2015-02-08 MED ORDER — LISINOPRIL 40 MG PO TABS: 40.0000 mg | ORAL_TABLET | Freq: Every day | ORAL | Status: DC

## 2015-02-08 NOTE — Progress Notes (Signed)
Pre visit review using our clinic review tool, if applicable. No additional management support is needed unless otherwise documented below in the visit note. 

## 2015-02-08 NOTE — Patient Instructions (Signed)
Limit your sodium (Salt) intake  Please check your blood pressure on a regular basis.  If it is consistently greater than 150/90, please make an office appointment.    It is important that you exercise regularly, at least 20 minutes 3 to 4 times per week.  If you develop chest pain or shortness of breath seek  medical attention.   You need to lose weight.  Consider a lower calorie diet and regular exercise.

## 2015-02-08 NOTE — Progress Notes (Signed)
Subjective:    Patient ID: John Riley, male    DOB: 1932-07-20, 81 y.o.   MRN: [id number]  HPI  Wt Readings from Last 3 Encounters:  02/08/15 242 lb (109.77 kg)  08/13/14 248 lb (112.492 kg)  02/09/14 249 lb (112.1 kg)    81 year old patient who is seen today for his biannual follow-up.  He has essential hypertension treated dyslipidemia.  He has a history of prostate cancer as well as remote non-small cell lung cancer of the right middle lobe.  Doing quite well today without concerns or complaints.  There is been some mild voluntary weight loss.  No shortness of breath.  Past Medical History  Diagnosis Date  . ELEVATED PROSTATE SPECIFIC ANTIGEN 07/31/2009  . HYPERLIPIDEMIA 06/28/2007  . HYPERTENSION 06/28/2007  . INSOMNIA 12/07/2007  . PULMONARY NODULE, RIGHT MIDDLE LOBE 12/07/2007  . TOBACCO ABUSE 11/09/2007  . Non-small cell lung cancer     History   Social History  . Marital Status: Widowed    Spouse Name: N/A  . Number of Children: N/A  . Years of Education: N/A   Occupational History  . Not on file.   Social History Main Topics  . Smoking status: Former Smoker    Quit date: 08/18/2007  . Smokeless tobacco: Never Used  . Alcohol Use: No  . Drug Use: No  . Sexual Activity: Not on file   Other Topics Concern  . Not on file   Social History Narrative    Past Surgical History  Procedure Laterality Date  . Appendectomy    . Vein ligation and stripping    . Lobectomy      right middle  . Prostate surgery      bx    No family history on file.  Allergies  Allergen Reactions  . Niacin     Current Outpatient Prescriptions on File Prior to Visit  Medication Sig Dispense Refill  . acetaminophen (TYLENOL) 325 MG tablet Take 650 mg by mouth every 6 (six) hours as needed.       No current facility-administered medications on file prior to visit.    BP 118/60 mmHg  Pulse 59  Temp(Src) 99 F (37.2 C) (Oral)  Resp 20  Ht 6' (1.829 m)  Wt 242  lb (109.77 kg)  BMI 32.81 kg/m2  SpO2 95%      Review of Systems  Constitutional: Negative for fever, chills, appetite change and fatigue.  HENT: Negative for congestion, dental problem, ear pain, hearing loss, sore throat, tinnitus, trouble swallowing and voice change.   Eyes: Negative for pain, discharge and visual disturbance.  Respiratory: Negative for cough, chest tightness, wheezing and stridor.   Cardiovascular: Positive for leg swelling. Negative for chest pain and palpitations.  Gastrointestinal: Negative for nausea, vomiting, abdominal pain, diarrhea, constipation, blood in stool and abdominal distention.  Genitourinary: Negative for urgency, hematuria, flank pain, discharge, difficulty urinating and genital sores.  Musculoskeletal: Negative for myalgias, back pain, joint swelling, arthralgias, gait problem and neck stiffness.  Skin: Negative for rash.  Neurological: Negative for dizziness, syncope, speech difficulty, weakness, numbness and headaches.  Hematological: Negative for adenopathy. Does not bruise/bleed easily.  Psychiatric/Behavioral: Negative for behavioral problems and dysphoric mood. The patient is not nervous/anxious.        Objective:   Physical Exam  Constitutional: He is oriented to person, place, and time. He appears well-developed.  HENT:  Head: Normocephalic.  Right Ear: External ear normal.  Left Ear: External ear normal.  Eyes: Conjunctivae and EOM are normal.  Neck: Normal range of motion.  Cardiovascular: Normal rate and normal heart sounds.   Pulmonary/Chest: Breath sounds normal.  Abdominal: Bowel sounds are normal.  Musculoskeletal: Normal range of motion. He exhibits edema. He exhibits no tenderness.  Stasis changes  Neurological: He is alert and oriented to person, place, and time.  Psychiatric: He has a normal mood and affect. His behavior is normal.          Assessment & Plan:   Essential hypertension,  well-controlled Dyslipidemia.  Continue statin therapy.  History of prostate cancer Remote history of lung cancer  Schedule CPX 6 months

## 2015-03-26 ENCOUNTER — Telehealth: Payer: Self-pay | Admitting: *Deleted

## 2015-03-26 NOTE — Telephone Encounter (Signed)
Patient left a message on my voicemail stating he needs a handicapped placard?  Patient's phone number is [phone].

## 2015-03-27 NOTE — Telephone Encounter (Signed)
Okay to give. 

## 2015-03-27 NOTE — Telephone Encounter (Signed)
Form ready and mailed to home address per patient's request

## 2015-03-27 NOTE — Telephone Encounter (Signed)
ok 

## 2015-09-13 ENCOUNTER — Encounter: Payer: Commercial Managed Care - HMO | Admitting: Internal Medicine

## 2015-09-17 ENCOUNTER — Ambulatory Visit (INDEPENDENT_AMBULATORY_CARE_PROVIDER_SITE_OTHER): Payer: Commercial Managed Care - HMO | Admitting: Internal Medicine

## 2015-09-17 ENCOUNTER — Encounter: Payer: Self-pay | Admitting: Internal Medicine

## 2015-09-17 DIAGNOSIS — Z0000 Encounter for general adult medical examination without abnormal findings: Secondary | ICD-10-CM | POA: Diagnosis not present

## 2015-09-17 DIAGNOSIS — R7302 Impaired glucose tolerance (oral): Secondary | ICD-10-CM

## 2015-09-17 DIAGNOSIS — I10 Essential (primary) hypertension: Secondary | ICD-10-CM

## 2015-09-17 DIAGNOSIS — E785 Hyperlipidemia, unspecified: Secondary | ICD-10-CM | POA: Diagnosis not present

## 2015-09-17 DIAGNOSIS — C61 Malignant neoplasm of prostate: Secondary | ICD-10-CM

## 2015-09-17 LAB — COMPREHENSIVE METABOLIC PANEL
ALT: 16 U/L (ref 0–53)
AST: 19 U/L (ref 0–37)
Albumin: 4.3 g/dL (ref 3.5–5.2)
Alkaline Phosphatase: 48 U/L (ref 39–117)
BUN: 15 mg/dL (ref 6–23)
CALCIUM: 9.6 mg/dL (ref 8.4–10.5)
CHLORIDE: 103 meq/L (ref 96–112)
CO2: 31 mEq/L (ref 19–32)
CREATININE: 0.82 mg/dL (ref 0.40–1.50)
GFR: 95.78 mL/min (ref >60.00)
GLUCOSE: 128 mg/dL — AB (ref 70–99)
Potassium: 5.2 mEq/L — ABNORMAL HIGH (ref 3.5–5.1)
SODIUM: 143 meq/L (ref 135–145)
Total Bilirubin: 1.6 mg/dL — ABNORMAL HIGH (ref 0.2–1.2)
Total Protein: 7.3 g/dL (ref 6.0–8.3)

## 2015-09-17 LAB — CBC WITH DIFFERENTIAL/PLATELET
BASOS PCT: 0 % (ref 0.0–3.0)
Basophils Absolute: 0 10*3/uL (ref 0.0–0.1)
EOS ABS: 0.1 10*3/uL (ref 0.0–0.7)
EOS PCT: 0.9 % (ref 0.0–5.0)
HEMATOCRIT: 46.9 % (ref 39.0–52.0)
HEMOGLOBIN: 15.9 g/dL (ref 13.0–17.0)
LYMPHS ABS: 0.9 10*3/uL (ref 0.7–4.0)
Lymphocytes Relative: 10.9 % — ABNORMAL LOW (ref 12.0–46.0)
MCHC: 33.8 g/dL (ref 30.0–36.0)
MCV: 95.5 fl (ref 78.0–100.0)
Monocytes Absolute: 0.6 10*3/uL (ref 0.1–1.0)
Monocytes Relative: 8 % (ref 3.0–12.0)
Neutro Abs: 6.5 10*3/uL (ref 1.4–7.7)
Neutrophils Relative %: 80.2 % — ABNORMAL HIGH (ref 43.0–77.0)
Platelets: 185 10*3/uL (ref 150.0–400.0)
RBC: 4.91 Mil/uL (ref 4.22–5.81)
RDW: 12.3 % (ref 11.5–15.5)
WBC: 8.1 10*3/uL (ref 4.0–10.5)

## 2015-09-17 LAB — LIPID PANEL
CHOL/HDL RATIO: 3
Cholesterol: 97 mg/dL (ref 0–200)
HDL: 37.5 mg/dL — ABNORMAL LOW (ref >39.00)
LDL Cholesterol: 34 mg/dL (ref 0–99)
NONHDL: 59.6
TRIGLYCERIDES: 129 mg/dL (ref 0.0–149.0)
VLDL: 25.8 mg/dL (ref 0.0–40.0)

## 2015-09-17 LAB — PSA: PSA: 17.48 ng/mL — ABNORMAL HIGH (ref 0.10–4.00)

## 2015-09-17 LAB — TSH: TSH: 0.93 u[IU]/mL (ref 0.35–4.50)

## 2015-09-17 MED ORDER — SIMVASTATIN 40 MG PO TABS: 40.0000 mg | ORAL_TABLET | Freq: Every day | ORAL | Status: DC

## 2015-09-17 MED ORDER — ATENOLOL 100 MG PO TABS: 100.0000 mg | ORAL_TABLET | Freq: Every day | ORAL | Status: DC

## 2015-09-17 MED ORDER — LISINOPRIL 40 MG PO TABS: 40.0000 mg | ORAL_TABLET | Freq: Every day | ORAL | Status: DC

## 2015-09-17 NOTE — Progress Notes (Signed)
Patient ID: John Riley, male   DOB: 09-30-1931, 82 y.o.   MRN: [id number]  Subjective:    Patient ID: John Riley, male    DOB: 04-08-1932, 82 y.o.   MRN: [id number]  Hyperlipidemia Pertinent negatives include no chest pain, myalgias or shortness of breath.  Hypertension Pertinent negatives include no chest pain, headaches, neck pain, palpitations or shortness of breath.  Pre-visit discussion using our clinic review tool. No additional management support is needed unless otherwise documented below in the visit note.  44  -year-old patient who is seen today for a wellness exam.  He is status post right middle lobe resection for a non-small cell lung cancer in 2009. He discontinued tobacco use in the spring of 2009. He has been followed by urology due to an elevated PSA. He had a prostate biopsy in 2011 that revealed low-grade prostate cancer. He has dyslipidemia and treated hypertension. He is doing quite well. He is a nondrinker but in the past had consumed beer He is doing well today.  Only complaint is some mild right knee discomfort.  He has been compliant with his medications  Wt Readings from Last 3 Encounters:  09/17/15 239 lb (108.41 kg)  02/08/15 242 lb (109.77 kg)  08/13/14 248 lb (112.492 kg)    He has been followed by Dr. Charlestine Night due  to an inflammatory arthritis.   Here for Medicare AWV:   1. Risk factors based on Past M, S, F history:cardiovascular risk factors include hypertension, and dyslipidemia  2. Physical Activities: no activity restrictions, although fairly sedentary  3. Depression/mood: no history depression, or mood disorder  4. Hearing: mildly impaired  5. ADL's: independent in all aspects of daily living. Lives independently  6. Fall Risk: low  7. Home Safety: no problems identified  8. Height, weight, &visual acuity:height and weight stable. No difficulty with visual acuity  9. Counseling: heart healthy diet, exercise, weight loss. All encouraged   10. Labs ordered based on risk factors: laboratory profile, including lipid panel will be reviewed  11. Referral Coordination- follow-up urology in the spring .  Encouraged  12. Care Plan- weight loss, heart healthy diet  13. Cognitive Assessment- alert and oriented, with normal affect. No cognitive dysfunction. 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  14.  Preventive services will include annual health examinations.  Screening laboratory data and serial PSAs will be reviewed; Patient was provided with a written and personalized care plan 15.  Provider list updated.  Includes primary care urology and rheumatology   Allergies:  1) ! 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 Father deceased at age 50,  cause unknown. Denies any family history of cancer, heart disease.   Social History:   Patient is widowed, his wife passed away 10years ago. He lives alone. He is retired from working in the [employer]. He has 3 children, only 1 daughter that lives in the area in [place], a son that lives in [place], and a son that lives in [place].   Alcohol use-no  Current Smoker 1 ppd >50 pk years- d/c tobacco 4-09   Review of Systems  Constitutional: Negative for fever, chills, activity change, appetite change and fatigue.  HENT: Negative for congestion, dental problem, ear pain, hearing loss, mouth sores, rhinorrhea, sinus pressure, sneezing, tinnitus, trouble swallowing and voice change.    Eyes: Negative for photophobia, pain, redness and visual disturbance.  Respiratory: Negative for apnea, cough, choking, chest tightness, shortness of breath and wheezing.   Cardiovascular: Negative for chest pain, palpitations and leg swelling.  Gastrointestinal: Negative for nausea, vomiting, abdominal pain, diarrhea, constipation, blood in stool, abdominal distention, anal bleeding and rectal pain.  Genitourinary: Negative for dysuria, urgency, frequency, hematuria, flank pain, decreased urine volume, discharge, penile swelling, scrotal swelling, difficulty urinating, genital sores and testicular pain.  Musculoskeletal: Negative for myalgias, back pain, joint swelling, arthralgias, gait problem, neck pain and neck stiffness.  Skin: Negative for color change, rash and wound.  Neurological: Negative for dizziness, tremors, seizures, syncope, facial asymmetry, speech difficulty, weakness, light-headedness, numbness and headaches.  Hematological: Negative for adenopathy. Does not bruise/bleed easily.  Psychiatric/Behavioral: Negative for suicidal ideas, hallucinations, behavioral problems, confusion, sleep disturbance, self-injury, dysphoric mood, decreased concentration and agitation. The patient is not nervous/anxious.        Objective:   Physical Exam  Constitutional: He appears well-developed and well-nourished.  Obese Repeat blood pressure 120 over 70  HENT:  Head: Normocephalic and atraumatic.  Right Ear: External ear normal.  Left Ear: External ear normal.  Nose: Nose normal.  Mouth/Throat: Oropharynx is clear and moist.  Edentulous  Eyes: Conjunctivae and EOM are normal. Pupils are equal, round, and reactive to light. No scleral icterus.  Neck: Normal range of motion. Neck supple. No JVD present. No thyromegaly present.  Cardiovascular: Regular rhythm, normal heart sounds and intact distal pulses.  Exam reveals no gallop and no friction rub.   No murmur heard. Percent was pedis  pulses intact.  Posterior tibial pulses difficult to palpate  Pulmonary/Chest: Effort normal and breath sounds normal. He exhibits no tenderness.  Abdominal: Soft. Bowel sounds are normal. He exhibits no distension and no mass. There is no tenderness.  Genitourinary: Penis normal. Guaiac negative stool.  Prostate minimally enlarged and slightly nodular  Musculoskeletal: Normal range of motion. He exhibits edema. He exhibits no tenderness.  Plus 2 ankle and pedal edema  Lymphadenopathy:    He has no cervical adenopathy.  Neurological: He is alert. He has normal reflexes. No cranial nerve deficit. Coordination normal.  Skin: Skin is warm and dry. No rash noted.  Psychiatric: He has a normal mood and affect. His behavior is normal.          Assessment & Plan:    Preventive health examination  History of elevated PSA/prostate cancer; status post biopsy 2011 revealing low-grade prostate cancer.  PSA levels have been slowly increasing  Hypertension.  Well-controlled  Dyslipidemia.  Continue statin therapy History of non-small cell lung cancer, right middle lobe   Medical regimen unchanged Recheck 6 months or when necessary Followup urology encouraged   Will review.  Follow-up lab including PSA.  Patient very reluctant to consider treatment of prostate cancer or even urological follow-up  Recheck here 6 months

## 2015-09-17 NOTE — Progress Notes (Signed)
Pre visit review using our clinic review tool, if applicable. No additional management support is needed unless otherwise documented below in the visit note. 

## 2015-09-17 NOTE — Patient Instructions (Signed)
Limit your sodium (Salt) intake  Please check your blood pressure on a regular basis.  If it is consistently greater than 150/90, please make an office appointment.    It is important that you exercise regularly, at least 20 minutes 3 to 4 times per week.  If you develop chest pain or shortness of breath seek  medical attention.  You need to lose weight.  Consider a lower calorie diet and regular exercise.  Urology follow-up  Return in 6 months for follow-up

## 2015-11-20 ENCOUNTER — Telehealth: Payer: Self-pay | Admitting: Internal Medicine

## 2015-11-20 DIAGNOSIS — M25569 Pain in unspecified knee: Secondary | ICD-10-CM

## 2015-11-20 NOTE — Telephone Encounter (Signed)
Okay to send referral to Ortho for Knee pain?

## 2015-11-20 NOTE — Telephone Encounter (Signed)
Pt would like a referral to ortho for knee pain. Pt last saw dr Raliegh Ip in jan 2017.

## 2015-11-20 NOTE — Telephone Encounter (Signed)
Okay for orthopedic referral

## 2015-11-21 NOTE — Telephone Encounter (Signed)
Spoke to pt's daughter [name], told her order for referral to Ortho was done and someone will contact you to schedule appt. [name] verbalized understanding.

## 2015-12-02 DIAGNOSIS — M170 Bilateral primary osteoarthritis of knee: Secondary | ICD-10-CM | POA: Diagnosis not present

## 2016-01-10 DIAGNOSIS — H5210 Myopia, unspecified eye: Secondary | ICD-10-CM | POA: Diagnosis not present

## 2016-01-10 DIAGNOSIS — H5213 Myopia, bilateral: Secondary | ICD-10-CM | POA: Diagnosis not present

## 2016-03-13 ENCOUNTER — Ambulatory Visit (INDEPENDENT_AMBULATORY_CARE_PROVIDER_SITE_OTHER): Payer: Commercial Managed Care - HMO | Admitting: Internal Medicine

## 2016-03-13 ENCOUNTER — Encounter: Payer: Self-pay | Admitting: Internal Medicine

## 2016-03-13 ENCOUNTER — Ambulatory Visit: Payer: Commercial Managed Care - HMO | Admitting: Internal Medicine

## 2016-03-13 VITALS — HR 67 | Temp 98.3°F | Ht 71.0 in | Wt 239.0 lb

## 2016-03-13 DIAGNOSIS — E785 Hyperlipidemia, unspecified: Secondary | ICD-10-CM

## 2016-03-13 DIAGNOSIS — I10 Essential (primary) hypertension: Secondary | ICD-10-CM | POA: Diagnosis not present

## 2016-03-13 MED ORDER — ATENOLOL 100 MG PO TABS: 100.0000 mg | ORAL_TABLET | Freq: Every day | ORAL | 3 refills | Status: DC

## 2016-03-13 MED ORDER — ASPIRIN 81 MG PO TABS: 81.0000 mg | ORAL_TABLET | Freq: Every day | ORAL | Status: DC

## 2016-03-13 MED ORDER — LISINOPRIL 40 MG PO TABS: 40.0000 mg | ORAL_TABLET | Freq: Every day | ORAL | 3 refills | Status: DC

## 2016-03-13 MED ORDER — SIMVASTATIN 40 MG PO TABS: 40.0000 mg | ORAL_TABLET | Freq: Every day | ORAL | 3 refills | Status: DC

## 2016-03-13 NOTE — Patient Instructions (Signed)
Limit your sodium (Salt) intake  Please check your blood pressure on a regular basis.  If it is consistently greater than 150/90, please make an office appointment.  Return in 6 months for follow-up   

## 2016-03-13 NOTE — Progress Notes (Signed)
Pre visit review using our clinic review tool, if applicable. No additional management support is needed unless otherwise documented below in the visit note. 

## 2016-03-13 NOTE — Progress Notes (Signed)
Subjective:    Patient ID: John Riley, male    DOB: 1932/05/01, 82 y.o.   MRN: [id number]  HPI  82 year old patient who has a history of hypertension and dyslipidemia.  He is doing quite well.  No cardiopulmonary complaints He has a history of prostate cancer Doing quite well  Past Medical History:  Diagnosis Date  . ELEVATED PROSTATE SPECIFIC ANTIGEN 07/31/2009  . HYPERLIPIDEMIA 06/28/2007  . HYPERTENSION 06/28/2007  . INSOMNIA 12/07/2007  . Non-small cell lung cancer ([place])   . PULMONARY NODULE, RIGHT MIDDLE LOBE 12/07/2007  . TOBACCO ABUSE 11/09/2007     Social History   Social History  . Marital status: Widowed    Spouse name: N/A  . Number of children: N/A  . Years of education: N/A   Occupational History  . Not on file.   Social History Main Topics  . Smoking status: Former Smoker    Quit date: 08/18/2007  . Smokeless tobacco: Never Used  . Alcohol use No  . Drug use: No  . Sexual activity: Not on file   Other Topics Concern  . Not on file   Social History Narrative  . No narrative on file    Past Surgical History:  Procedure Laterality Date  . APPENDECTOMY    . LOBECTOMY     right middle  . PROSTATE SURGERY     bx  . VEIN LIGATION AND STRIPPING      No family history on file.  Allergies  Allergen Reactions  . Niacin     Current Outpatient Prescriptions on File Prior to Visit  Medication Sig Dispense Refill  . acetaminophen (TYLENOL) 325 MG tablet Take 650 mg by mouth every 6 (six) hours as needed.       No current facility-administered medications on file prior to visit.     BP (P) 120/62 (BP Location: Left Arm, Patient Position: Sitting, Cuff Size: Normal)   Pulse 67   Temp 98.3 F (36.8 C) (Oral)   Ht '5\' 11"'$  (1.803 m)   Wt 239 lb (108.4 kg)   SpO2 94%   BMI 33.33 kg/m     Review of Systems  Constitutional: Negative for appetite change, chills, fatigue and fever.  HENT: Negative for congestion, dental problem, ear pain,  hearing loss, sore throat, tinnitus, trouble swallowing and voice change.   Eyes: Negative for pain, discharge and visual disturbance.  Respiratory: Negative for cough, chest tightness, wheezing and stridor.   Cardiovascular: Negative for chest pain, palpitations and leg swelling.  Gastrointestinal: Negative for abdominal distention, abdominal pain, blood in stool, constipation, diarrhea, nausea and vomiting.  Genitourinary: Negative for difficulty urinating, discharge, flank pain, genital sores, hematuria and urgency.  Musculoskeletal: Negative for arthralgias, back pain, gait problem, joint swelling, myalgias and neck stiffness.  Skin: Negative for rash.  Neurological: Negative for dizziness, syncope, speech difficulty, weakness, numbness and headaches.  Hematological: Negative for adenopathy. Does not bruise/bleed easily.  Psychiatric/Behavioral: Negative for behavioral problems and dysphoric mood. The patient is not nervous/anxious.        Objective:   Physical Exam  Constitutional: He is oriented to person, place, and time. He appears well-developed.  Blood pressure 110/72  HENT:  Head: Normocephalic.  Right Ear: External ear normal.  Left Ear: External ear normal.  Eyes: Conjunctivae and EOM are normal.  Neck: Normal range of motion.  Cardiovascular: Normal rate and normal heart sounds.   Pulmonary/Chest: Breath sounds normal.  Abdominal: Bowel sounds are normal.  Musculoskeletal: Normal  range of motion. He exhibits no edema or tenderness.  Neurological: He is alert and oriented to person, place, and time.  Psychiatric: He has a normal mood and affect. His behavior is normal.          Assessment & Plan:   Hypertension, well-controlled Dyslipidemia.  Continue statin therapy History prostate cancer.  Recheck PSA next year  No change in medical regimen  Nyoka Cowden, MD

## 2016-03-19 DIAGNOSIS — M170 Bilateral primary osteoarthritis of knee: Secondary | ICD-10-CM | POA: Diagnosis not present

## 2016-06-24 DIAGNOSIS — M170 Bilateral primary osteoarthritis of knee: Secondary | ICD-10-CM | POA: Diagnosis not present

## 2016-09-18 ENCOUNTER — Ambulatory Visit (INDEPENDENT_AMBULATORY_CARE_PROVIDER_SITE_OTHER): Payer: Medicare HMO | Admitting: Internal Medicine

## 2016-09-18 ENCOUNTER — Ambulatory Visit: Payer: Commercial Managed Care - HMO | Admitting: Internal Medicine

## 2016-09-18 ENCOUNTER — Encounter: Payer: Self-pay | Admitting: Internal Medicine

## 2016-09-18 VITALS — BP 110/68 | HR 65 | Temp 97.6°F | Ht 71.0 in | Wt 239.6 lb

## 2016-09-18 DIAGNOSIS — I10 Essential (primary) hypertension: Secondary | ICD-10-CM

## 2016-09-18 DIAGNOSIS — E785 Hyperlipidemia, unspecified: Secondary | ICD-10-CM

## 2016-09-18 DIAGNOSIS — R7302 Impaired glucose tolerance (oral): Secondary | ICD-10-CM

## 2016-09-18 DIAGNOSIS — C61 Malignant neoplasm of prostate: Secondary | ICD-10-CM | POA: Diagnosis not present

## 2016-09-18 LAB — CBC WITH DIFFERENTIAL/PLATELET
BASOS ABS: 0.1 10*3/uL (ref 0.0–0.1)
Basophils Relative: 0.7 % (ref 0.0–3.0)
EOS PCT: 1.1 % (ref 0.0–5.0)
Eosinophils Absolute: 0.1 10*3/uL (ref 0.0–0.7)
HCT: 44.8 % (ref 39.0–52.0)
Hemoglobin: 15.4 g/dL (ref 13.0–17.0)
LYMPHS ABS: 0.9 10*3/uL (ref 0.7–4.0)
LYMPHS PCT: 11.3 % — AB (ref 12.0–46.0)
MCHC: 34.5 g/dL (ref 30.0–36.0)
MCV: 97.2 fl (ref 78.0–100.0)
Monocytes Absolute: 0.7 10*3/uL (ref 0.1–1.0)
Monocytes Relative: 8.8 % (ref 3.0–12.0)
NEUTROS PCT: 78.1 % — AB (ref 43.0–77.0)
Neutro Abs: 6.2 10*3/uL (ref 1.4–7.7)
Platelets: 189 10*3/uL (ref 150.0–400.0)
RBC: 4.6 Mil/uL (ref 4.22–5.81)
RDW: 12.5 % (ref 11.5–15.5)
WBC: 7.9 10*3/uL (ref 4.0–10.5)

## 2016-09-18 LAB — COMPREHENSIVE METABOLIC PANEL
ALK PHOS: 52 U/L (ref 39–117)
ALT: 16 U/L (ref 0–53)
AST: 19 U/L (ref 0–37)
Albumin: 4.4 g/dL (ref 3.5–5.2)
BILIRUBIN TOTAL: 2 mg/dL — AB (ref 0.2–1.2)
BUN: 15 mg/dL (ref 6–23)
CALCIUM: 9.3 mg/dL (ref 8.4–10.5)
CO2: 32 meq/L (ref 19–32)
Chloride: 101 mEq/L (ref 96–112)
Creatinine, Ser: 0.78 mg/dL (ref 0.40–1.50)
GFR: 101.22 mL/min (ref >60.00)
GLUCOSE: 119 mg/dL — AB (ref 70–99)
POTASSIUM: 4.9 meq/L (ref 3.5–5.1)
Sodium: 141 mEq/L (ref 135–145)
TOTAL PROTEIN: 7 g/dL (ref 6.0–8.3)

## 2016-09-18 LAB — LIPID PANEL
CHOL/HDL RATIO: 3
Cholesterol: 103 mg/dL (ref 0–200)
HDL: 39 mg/dL — AB (ref >39.00)
LDL Cholesterol: 36 mg/dL (ref 0–99)
NONHDL: 63.69
TRIGLYCERIDES: 137 mg/dL (ref 0.0–149.0)
VLDL: 27.4 mg/dL (ref 0.0–40.0)

## 2016-09-18 LAB — TSH: TSH: 1.46 u[IU]/mL (ref 0.35–4.50)

## 2016-09-18 LAB — PSA: PSA: 21.69 ng/mL — ABNORMAL HIGH (ref 0.10–4.00)

## 2016-09-18 MED ORDER — ACETAMINOPHEN 325 MG PO TABS: 650.0000 mg | ORAL_TABLET | Freq: Four times a day (QID) | ORAL | 5 refills | Status: DC | PRN

## 2016-09-18 MED ORDER — ATENOLOL 100 MG PO TABS: 100.0000 mg | ORAL_TABLET | Freq: Every day | ORAL | 3 refills | Status: DC

## 2016-09-18 MED ORDER — SIMVASTATIN 40 MG PO TABS: 40.0000 mg | ORAL_TABLET | Freq: Every day | ORAL | 3 refills | Status: DC

## 2016-09-18 MED ORDER — ASPIRIN 81 MG PO TABS: 81.0000 mg | ORAL_TABLET | Freq: Every day | ORAL | Status: DC

## 2016-09-18 MED ORDER — LISINOPRIL 40 MG PO TABS
40.0000 mg | ORAL_TABLET | Freq: Every day | ORAL | 3 refills | Status: DC
Start: 2016-09-18 — End: 2017-10-04

## 2016-09-18 NOTE — Progress Notes (Signed)
Pre visit review using our clinic review tool, if applicable. No additional management support is needed unless otherwise documented below in the visit note. 

## 2016-09-18 NOTE — Progress Notes (Signed)
Subjective:    Patient ID: John Riley, male    DOB: 03/26/1932, 83 y.o.   MRN: [id number]  HPI 83 year old patient who is seen today for his biannual follow-up.  He has a history of essential hypertension and dyslipidemia. He also has a history of prostate cancer and increasing PSAs.  He has been followed by [facility] urology ( Dr. Michaelle Birks) , but has not been in for approximately 3 years.  Patient states that he does not desire any treatment for prostate cancer Otherwise, doing well.  No concerns or complaints  Past Medical History:  Diagnosis Date  . ELEVATED PROSTATE SPECIFIC ANTIGEN 07/31/2009  . HYPERLIPIDEMIA 06/28/2007  . HYPERTENSION 06/28/2007  . INSOMNIA 12/07/2007  . Non-small cell lung cancer ([place])   . PULMONARY NODULE, RIGHT MIDDLE LOBE 12/07/2007  . TOBACCO ABUSE 11/09/2007     Social History   Social History  . Marital status: Widowed    Spouse name: N/A  . Number of children: N/A  . Years of education: N/A   Occupational History  . Not on file.   Social History Main Topics  . Smoking status: Former Smoker    Quit date: 08/18/2007  . Smokeless tobacco: Never Used  . Alcohol use No  . Drug use: No  . Sexual activity: Not on file   Other Topics Concern  . Not on file   Social History Narrative  . No narrative on file    Past Surgical History:  Procedure Laterality Date  . APPENDECTOMY    . LOBECTOMY     right middle  . PROSTATE SURGERY     bx  . VEIN LIGATION AND STRIPPING      No family history on file.  Allergies  Allergen Reactions  . Niacin     Current Outpatient Prescriptions on File Prior to Visit  Medication Sig Dispense Refill  . acetaminophen (TYLENOL) 325 MG tablet Take 650 mg by mouth every 6 (six) hours as needed.      Marland Kitchen aspirin 81 MG tablet Take 1 tablet (81 mg total) by mouth daily. 30 tablet   . atenolol (TENORMIN) 100 MG tablet Take 1 tablet (100 mg total) by mouth daily. 90 tablet 3  . lisinopril  (PRINIVIL,ZESTRIL) 40 MG tablet Take 1 tablet (40 mg total) by mouth daily. 90 tablet 3  . simvastatin (ZOCOR) 40 MG tablet Take 1 tablet (40 mg total) by mouth at bedtime. 90 tablet 3   No current facility-administered medications on file prior to visit.     BP 110/68 (BP Location: Left Arm, Patient Position: Sitting, Cuff Size: Normal)   Pulse 65   Temp 97.6 F (36.4 C) (Oral)   Ht '5\' 11"'$  (1.803 m)   Wt 239 lb 9.6 oz (108.7 kg)   BMI 33.42 kg/m      Review of Systems  Constitutional: Negative for appetite change, chills, fatigue and fever.  HENT: Negative for congestion, dental problem, ear pain, hearing loss, sore throat, tinnitus, trouble swallowing and voice change.   Eyes: Negative for pain, discharge and visual disturbance.  Respiratory: Negative for cough, chest tightness, wheezing and stridor.   Cardiovascular: Negative for chest pain, palpitations and leg swelling.  Gastrointestinal: Negative for abdominal distention, abdominal pain, blood in stool, constipation, diarrhea, nausea and vomiting.  Genitourinary: Negative for difficulty urinating, discharge, flank pain, genital sores, hematuria and urgency.  Musculoskeletal: Negative for arthralgias, back pain, gait problem, joint swelling, myalgias and neck stiffness.  Skin: Negative for  rash.  Neurological: Negative for dizziness, syncope, speech difficulty, weakness, numbness and headaches.  Hematological: Negative for adenopathy. Does not bruise/bleed easily.  Psychiatric/Behavioral: Negative for behavioral problems and dysphoric mood. The patient is not nervous/anxious.        Objective:   Physical Exam  Constitutional: He is oriented to person, place, and time. He appears well-developed.  Blood pressure 110/70  HENT:  Head: Normocephalic.  Right Ear: External ear normal.  Left Ear: External ear normal.  Eyes: Conjunctivae and EOM are normal.  Neck: Normal range of motion.  Cardiovascular: Normal rate and normal  heart sounds.   Pulmonary/Chest: Breath sounds normal.  Abdominal: Bowel sounds are normal.  Musculoskeletal: Normal range of motion. He exhibits edema. He exhibits no tenderness.  Plus 2 lower extremity edema  Neurological: He is alert and oriented to person, place, and time.  Psychiatric: He has a normal mood and affect. His behavior is normal.          Assessment & Plan:   Essential hypertension, well-controlled Dyslipidemia.  Continue statin therapy Prostate cancer.  Patient encouraged to follow-up with urology.  We'll check PSA  Follow-up here 6 months or as needed Medications updated Low-salt diet.  Encouraged  Nyoka Cowden

## 2016-09-18 NOTE — Patient Instructions (Signed)
Limit your sodium (Salt) intake    It is important that you exercise regularly, at least 20 minutes 3 to 4 times per week.  If you develop chest pain or shortness of breath seek  medical attention.  Consider follow-up with Dr. Felipa Eth for prostate cancer  Return in 6 months for follow-up

## 2016-09-24 ENCOUNTER — Telehealth: Payer: Self-pay | Admitting: Internal Medicine

## 2016-09-24 NOTE — Telephone Encounter (Signed)
Pt would like a copy of his labs results from 09/18/16 mailed to his home

## 2016-09-25 NOTE — Telephone Encounter (Signed)
Results mailed to pt on 09/25/16.

## 2017-01-08 DIAGNOSIS — M170 Bilateral primary osteoarthritis of knee: Secondary | ICD-10-CM | POA: Diagnosis not present

## 2017-01-15 DIAGNOSIS — M170 Bilateral primary osteoarthritis of knee: Secondary | ICD-10-CM | POA: Diagnosis not present

## 2017-01-22 DIAGNOSIS — M170 Bilateral primary osteoarthritis of knee: Secondary | ICD-10-CM | POA: Diagnosis not present

## 2017-03-19 ENCOUNTER — Encounter: Payer: Self-pay | Admitting: Internal Medicine

## 2017-03-19 ENCOUNTER — Ambulatory Visit (INDEPENDENT_AMBULATORY_CARE_PROVIDER_SITE_OTHER): Payer: Medicare HMO | Admitting: Internal Medicine

## 2017-03-19 VITALS — BP 142/82 | HR 64 | Temp 98.1°F | Ht 71.0 in | Wt 240.8 lb

## 2017-03-19 DIAGNOSIS — I10 Essential (primary) hypertension: Secondary | ICD-10-CM

## 2017-03-19 DIAGNOSIS — E785 Hyperlipidemia, unspecified: Secondary | ICD-10-CM | POA: Diagnosis not present

## 2017-03-19 DIAGNOSIS — R7302 Impaired glucose tolerance (oral): Secondary | ICD-10-CM

## 2017-03-19 NOTE — Progress Notes (Signed)
Subjective:    Patient ID: John Riley, male    DOB: [birth year], 83 y.o.   MRN: [id number]  HPI  83 year old patient who is seen today for his biannual follow-up.  He has a prior history of lung cancer and also prostate cancer. He does not desire any further follow-up or any active treatment He has a history of dyslipidemia.  Lipid profile was reviewed in February. He did have a nuclear stress test ordered by cardiology a few years ago.  He denies any exertional chest pain He does have some mild DOE when he walks up one flight of stairs  History of complaint is mild productive cough, itchy eyes and some rhinorrhea  Past Medical History:  Diagnosis Date  . ELEVATED PROSTATE SPECIFIC ANTIGEN 07/31/2009  . HYPERLIPIDEMIA 06/28/2007  . HYPERTENSION 06/28/2007  . INSOMNIA 12/07/2007  . Non-small cell lung cancer ([place])   . PULMONARY NODULE, RIGHT MIDDLE LOBE 12/07/2007  . TOBACCO ABUSE 11/09/2007     Social History   Social History  . Marital status: Widowed    Spouse name: N/A  . Number of children: N/A  . Years of education: N/A   Occupational History  . Not on file.   Social History Main Topics  . Smoking status: Former Smoker    Quit date: 08/18/2007  . Smokeless tobacco: Never Used  . Alcohol use No  . Drug use: No  . Sexual activity: Not on file   Other Topics Concern  . Not on file   Social History Narrative  . No narrative on file    Past Surgical History:  Procedure Laterality Date  . APPENDECTOMY    . LOBECTOMY     right middle  . PROSTATE SURGERY     bx  . VEIN LIGATION AND STRIPPING      No family history on file.  Allergies  Allergen Reactions  . Niacin     Current Outpatient Prescriptions on File Prior to Visit  Medication Sig Dispense Refill  . acetaminophen (TYLENOL) 325 MG tablet Take 2 tablets (650 mg total) by mouth every 6 (six) hours as needed. 240 tablet 5  . atenolol (TENORMIN) 100 MG tablet Take 1 tablet (100 mg total) by  mouth daily. 90 tablet 3  . lisinopril (PRINIVIL,ZESTRIL) 40 MG tablet Take 1 tablet (40 mg total) by mouth daily. 90 tablet 3  . simvastatin (ZOCOR) 40 MG tablet Take 1 tablet (40 mg total) by mouth at bedtime. 90 tablet 3  . aspirin 81 MG tablet Take 1 tablet (81 mg total) by mouth daily. (Patient not taking: Reported on 03/19/2017) 30 tablet    No current facility-administered medications on file prior to visit.     BP (!) 142/82 (BP Location: Left Arm, Patient Position: Sitting, Cuff Size: Normal)   Pulse 64   Temp 98.1 F (36.7 C) (Oral)   Ht 5\' 11"  (1.803 m)   Wt 240 lb 12.8 oz (109.2 kg)   SpO2 (!) 84%   BMI 33.58 kg/m     Review of Systems  HENT: Positive for postnasal drip and rhinorrhea.   Eyes: Positive for itching.  Respiratory: Positive for cough and shortness of breath.        Objective:   Physical Exam  Constitutional: He is oriented to person, place, and time. He appears well-developed. No distress.  Blood pressure 124/76  HENT:  Head: Normocephalic.  Right Ear: External ear normal.  Left Ear: External ear normal.  Eyes: Conjunctivae and  EOM are normal.  Neck: Normal range of motion.  Cardiovascular: Normal rate and normal heart sounds.   Pulmonary/Chest:  O2 saturation 93%  Decreased breath sounds right lower hemithorax  Abdominal: Bowel sounds are normal.  Musculoskeletal: Normal range of motion. He exhibits no edema or tenderness.  Neurological: He is alert and oriented to person, place, and time.  Psychiatric: He has a normal mood and affect. His behavior is normal.          Assessment & Plan:   Dyslipidemia.  Continue statin therapy Essential hypertension, stable History lung cancer Prostate cancer.  Slowly progressive.  Patient does not desire any follow-up or potential active treatment  Medications updated Recheck 6 months  Jeury Mcnab Pilar Plate

## 2017-03-19 NOTE — Patient Instructions (Addendum)
Trial of Claritin 10 mg once daily  Take over-the-counter expectorants and cough medications such as  Mucinex DM.    Hydrate and Humidify  Drink enough water to keep your urine clear or pale yellow. Staying hydrated will help to thin your mucus.  Use a cool mist humidifier to keep the humidity level in your home above 50%.  Inhale steam for 10-15 minutes, 3-4 times a day or as told by your health care provider. You can do this in the bathroom while a hot shower is running.  Limit your exposure to cool or dry air. I

## 2017-05-25 DIAGNOSIS — M170 Bilateral primary osteoarthritis of knee: Secondary | ICD-10-CM | POA: Diagnosis not present

## 2017-09-20 ENCOUNTER — Ambulatory Visit: Payer: Medicare HMO | Admitting: Internal Medicine

## 2017-09-27 ENCOUNTER — Encounter: Payer: Self-pay | Admitting: Internal Medicine

## 2017-09-27 ENCOUNTER — Ambulatory Visit (INDEPENDENT_AMBULATORY_CARE_PROVIDER_SITE_OTHER): Payer: Medicare HMO | Admitting: Internal Medicine

## 2017-09-27 VITALS — BP 110/68 | HR 76 | Temp 98.0°F | Ht 72.0 in | Wt 231.6 lb

## 2017-09-27 DIAGNOSIS — C61 Malignant neoplasm of prostate: Secondary | ICD-10-CM | POA: Diagnosis not present

## 2017-09-27 DIAGNOSIS — E785 Hyperlipidemia, unspecified: Secondary | ICD-10-CM | POA: Diagnosis not present

## 2017-09-27 DIAGNOSIS — Z0000 Encounter for general adult medical examination without abnormal findings: Secondary | ICD-10-CM | POA: Diagnosis not present

## 2017-09-27 DIAGNOSIS — R7302 Impaired glucose tolerance (oral): Secondary | ICD-10-CM | POA: Diagnosis not present

## 2017-09-27 DIAGNOSIS — I10 Essential (primary) hypertension: Secondary | ICD-10-CM | POA: Diagnosis not present

## 2017-09-27 LAB — CBC WITH DIFFERENTIAL/PLATELET
BASOS ABS: 34 {cells}/uL (ref 0–200)
Basophils Relative: 0.4 %
EOS PCT: 2.6 %
Eosinophils Absolute: 224 cells/uL (ref 15–500)
HCT: 44 % (ref 38.5–50.0)
HEMOGLOBIN: 15.1 g/dL (ref 13.2–17.1)
Lymphs Abs: 817 cells/uL — ABNORMAL LOW (ref 850–3900)
MCH: 32.5 pg (ref 27.0–33.0)
MCHC: 34.3 g/dL (ref 32.0–36.0)
MCV: 94.6 fL (ref 80.0–100.0)
MONOS PCT: 8.9 %
MPV: 12.7 fL — ABNORMAL HIGH (ref 7.5–12.5)
NEUTROS ABS: 6760 {cells}/uL (ref 1500–7800)
Neutrophils Relative %: 78.6 %
PLATELETS: 207 10*3/uL (ref 140–400)
RBC: 4.65 10*6/uL (ref 4.20–5.80)
RDW: 11.4 % (ref 11.0–15.0)
TOTAL LYMPHOCYTE: 9.5 %
WBC mixed population: 765 cells/uL (ref 200–950)
WBC: 8.6 10*3/uL (ref 3.8–10.8)

## 2017-09-27 LAB — COMPREHENSIVE METABOLIC PANEL
ALBUMIN: 4.1 g/dL (ref 3.5–5.2)
ALK PHOS: 47 U/L (ref 39–117)
ALT: 12 U/L (ref 0–53)
AST: 15 U/L (ref 0–37)
BUN: 14 mg/dL (ref 6–23)
CALCIUM: 9.2 mg/dL (ref 8.4–10.5)
CO2: 31 mEq/L (ref 19–32)
CREATININE: 0.74 mg/dL (ref 0.40–1.50)
Chloride: 101 mEq/L (ref 96–112)
GFR: 107.29 mL/min (ref >60.00)
Glucose, Bld: 107 mg/dL — ABNORMAL HIGH (ref 70–99)
POTASSIUM: 4.8 meq/L (ref 3.5–5.1)
Sodium: 141 mEq/L (ref 135–145)
TOTAL PROTEIN: 7 g/dL (ref 6.0–8.3)
Total Bilirubin: 2.1 mg/dL — ABNORMAL HIGH (ref 0.2–1.2)

## 2017-09-27 LAB — LIPID PANEL
CHOLESTEROL: 99 mg/dL (ref 0–200)
HDL: 36.3 mg/dL — ABNORMAL LOW (ref >39.00)
LDL CALC: 39 mg/dL (ref 0–99)
NonHDL: 63.06
TRIGLYCERIDES: 119 mg/dL (ref 0.0–149.0)
Total CHOL/HDL Ratio: 3
VLDL: 23.8 mg/dL (ref 0.0–40.0)

## 2017-09-27 LAB — TSH: TSH: 1.82 u[IU]/mL (ref 0.35–4.50)

## 2017-09-27 LAB — PSA: PSA: 23.23 ng/mL — AB (ref 0.10–4.00)

## 2017-09-27 LAB — HEMOGLOBIN A1C: HEMOGLOBIN A1C: 5.7 % (ref 4.6–6.5)

## 2017-09-27 NOTE — Addendum Note (Signed)
Addended by: Tomi Likens on: 09/27/2017 09:22 AM   Modules accepted: Orders

## 2017-09-27 NOTE — Patient Instructions (Signed)

## 2017-09-27 NOTE — Progress Notes (Signed)
Subjective:    Patient ID: John Riley, male    DOB: [birth year], 84 y.o.   MRN: [id number]  HPI  84 year old patient who is seen today for an annual exam in subsequent Medicare wellness visit Medical problems include remote history of prostate cancer.  He has hypertension and dyslipidemia.  He also has a history of impaired glucose tolerance remote history of non-small cell lung cancer.  Past Medical History:  Diagnosis Date  . ELEVATED PROSTATE SPECIFIC ANTIGEN 07/31/2009  . HYPERLIPIDEMIA 06/28/2007  . HYPERTENSION 06/28/2007  . INSOMNIA 12/07/2007  . Non-small cell lung cancer ([place])   . PULMONARY NODULE, RIGHT MIDDLE LOBE 12/07/2007  . TOBACCO ABUSE 11/09/2007     Social History   Socioeconomic History  . Marital status: Widowed    Spouse name: Not on file  . Number of children: Not on file  . Years of education: Not on file  . Highest education level: Not on file  Social Needs  . Financial resource strain: Not on file  . Food insecurity - worry: Not on file  . Food insecurity - inability: Not on file  . Transportation needs - medical: Not on file  . Transportation needs - non-medical: Not on file  Occupational History  . Not on file  Tobacco Use  . Smoking status: Former Smoker    Last attempt to quit: 08/18/2007    Years since quitting: 10.1  . Smokeless tobacco: Never Used  Substance and Sexual Activity  . Alcohol use: No  . Drug use: No  . Sexual activity: Not on file  Other Topics Concern  . Not on file  Social History Narrative  . Not on file    Past Surgical History:  Procedure Laterality Date  . APPENDECTOMY    . LOBECTOMY     right middle  . PROSTATE SURGERY     bx  . VEIN LIGATION AND STRIPPING      History reviewed. No pertinent family history.  Allergies  Allergen Reactions  . Niacin     Current Outpatient Medications on File Prior to Visit  Medication Sig Dispense Refill  . acetaminophen (TYLENOL) 325 MG tablet Take 2 tablets  (650 mg total) by mouth every 6 (six) hours as needed. 240 tablet 5  . aspirin 81 MG tablet Take 1 tablet (81 mg total) by mouth daily. (Patient not taking: Reported on 03/19/2017) 30 tablet   . atenolol (TENORMIN) 100 MG tablet Take 1 tablet (100 mg total) by mouth daily. 90 tablet 3  . lisinopril (PRINIVIL,ZESTRIL) 40 MG tablet Take 1 tablet (40 mg total) by mouth daily. 90 tablet 3  . simvastatin (ZOCOR) 40 MG tablet Take 1 tablet (40 mg total) by mouth at bedtime. 90 tablet 3   No current facility-administered medications on file prior to visit.     BP 110/68 (BP Location: Right Arm, Patient Position: Sitting, Cuff Size: Large)   Pulse 76   Temp 98 F (36.7 C) (Oral)   Ht 6' (1.829 m)   Wt 231 lb 9.6 oz (105.1 kg)   SpO2 94%   BMI 31.41 kg/m   Subsequent annual Medicare wellness visit  1. Risk factors based on Past M, S, F history:cardiovascular risk factors include hypertension, and dyslipidemia  2. Physical Activities: no activity restrictions, although fairly sedentary  3. Depression/mood: no history depression, or mood disorder  4. Hearing: mildly impaired  5. ADL's: independent in all aspects of daily living. Lives independently  6. Fall Risk: low  7. Home Safety: no problems identified  8. Height, weight, &visual acuity:height and weight stable. No difficulty with visual acuity  9. Counseling: heart healthy diet, exercise, weight loss. All encouraged  10. Labs ordered based on risk factors: laboratory profile, including lipid panel will be reviewed  11. Referral Coordination-has been followed by urology in the past  12. Care Plan- weight loss, heart healthy diet  13. Cognitive Assessment- alert and oriented, with normal affect. No cognitive dysfunction. 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  14.  Preventive services will include annual health examinations.  Screening laboratory data and serial PSAs will be reviewed; Patient was provided with a written and  personalized care plan 15.  Provider list updated.  Includes primary care urology    Review of Systems  Constitutional: Negative for appetite change, chills, fatigue and fever.  HENT: Negative for congestion, dental problem, ear pain, hearing loss, sore throat, tinnitus, trouble swallowing and voice change.   Eyes: Negative for pain, discharge and visual disturbance.  Respiratory: Negative for cough, chest tightness, wheezing and stridor.   Cardiovascular: Positive for leg swelling. Negative for chest pain and palpitations.  Gastrointestinal: Negative for abdominal distention, abdominal pain, blood in stool, constipation, diarrhea, nausea and vomiting.  Genitourinary: Negative for difficulty urinating, discharge, flank pain, genital sores, hematuria and urgency.  Musculoskeletal: Positive for arthralgias. Negative for back pain, gait problem, joint swelling, myalgias and neck stiffness.  Skin: Negative for rash.  Neurological: Negative for dizziness, syncope, speech difficulty, weakness, numbness and headaches.  Hematological: Negative for adenopathy. Does not bruise/bleed easily.  Psychiatric/Behavioral: Negative for behavioral problems and dysphoric mood. The patient is not nervous/anxious.        Objective:   Physical Exam  Constitutional: He is oriented to person, place, and time. He appears well-developed.  Weight 231 Blood pressure 110/64  HENT:  Head: Normocephalic.  Right Ear: External ear normal.  Left Ear: External ear normal.  Eyes: Conjunctivae and EOM are normal.  Neck: Normal range of motion.  Cardiovascular: Normal rate and normal heart sounds.  Dorsalis pedis pulses +3.  Posterior tibial pulses +1  Pulmonary/Chest: Breath sounds normal.  Abdominal: Bowel sounds are normal.  Genitourinary: Rectal exam shows guaiac negative stool.  Genitourinary Comments: Small nodular prostate  Musculoskeletal: Normal range of motion. He exhibits edema. He exhibits no tenderness.   Right foot greater than left  Neurological: He is alert and oriented to person, place, and time.  Psychiatric: He has a normal mood and affect. His behavior is normal.          Assessment & Plan:   Preventive health examination Subsequent Medicare wellness visit Essential hypertension well-controlled Dyslipidemia.  Continue statin therapy History of prostate cancer.  Will check PSA.  Patient states that he does not desire treatment.  Last urology visit was about 3-4 years ago  Follow-up for 6 months Low-salt diet encouraged Weight loss encouraged Tetanus vaccine administered  Nyoka Cowden

## 2017-09-29 ENCOUNTER — Telehealth: Payer: Self-pay

## 2017-09-29 NOTE — Telephone Encounter (Signed)
Spoke with [name] Patients daughter about results.

## 2017-10-03 ENCOUNTER — Other Ambulatory Visit: Payer: Self-pay | Admitting: Internal Medicine

## 2017-10-04 ENCOUNTER — Telehealth: Payer: Self-pay | Admitting: Internal Medicine

## 2017-10-04 MED ORDER — ATENOLOL 100 MG PO TABS: 100.0000 mg | ORAL_TABLET | Freq: Every day | ORAL | 1 refills | Status: DC

## 2017-10-04 MED ORDER — LISINOPRIL 40 MG PO TABS: 40.0000 mg | ORAL_TABLET | Freq: Every day | ORAL | 1 refills | Status: DC

## 2017-10-04 MED ORDER — SIMVASTATIN 40 MG PO TABS: 40.0000 mg | ORAL_TABLET | Freq: Every day | ORAL | 1 refills | Status: DC

## 2017-10-04 NOTE — Telephone Encounter (Signed)
Copied from [place] [phone]. Topic: Quick Communication - Rx Refill/Question >> Oct 04, 2017 11:31 AM Corie Chiquito, NT wrote: Medication: Atenolol, Linsinopril, Simvastatin   Has the patient contacted their pharmacy? Yes    Patient daughter calling because he still has not received his refill on the three above medications. Stated that all three are supposed to be a 90 day supply. He has been in contact with the pharmacy as well about the medications  Preferred Pharm[facility] (with phone number or street name): [address] [place] [phone]   Agent: Please be advised that RX refills may take up to 3 business days. We ask that you follow-up with your pharmacy.

## 2018-03-28 ENCOUNTER — Ambulatory Visit (INDEPENDENT_AMBULATORY_CARE_PROVIDER_SITE_OTHER): Payer: Medicare HMO | Admitting: Internal Medicine

## 2018-03-28 ENCOUNTER — Encounter: Payer: Self-pay | Admitting: Internal Medicine

## 2018-03-28 VITALS — BP 120/70 | HR 58 | Temp 98.3°F | Wt 231.8 lb

## 2018-03-28 DIAGNOSIS — R7302 Impaired glucose tolerance (oral): Secondary | ICD-10-CM | POA: Diagnosis not present

## 2018-03-28 LAB — POCT GLYCOSYLATED HEMOGLOBIN (HGB A1C): Hemoglobin A1C: 5.3 % (ref 4.0–5.6)

## 2018-03-28 MED ORDER — ATENOLOL 100 MG PO TABS: 100.0000 mg | ORAL_TABLET | Freq: Every day | ORAL | 4 refills | Status: DC

## 2018-03-28 MED ORDER — SIMVASTATIN 40 MG PO TABS: 40.0000 mg | ORAL_TABLET | Freq: Every day | ORAL | 4 refills | Status: DC

## 2018-03-28 MED ORDER — LISINOPRIL 40 MG PO TABS: 40.0000 mg | ORAL_TABLET | Freq: Every day | ORAL | 4 refills | Status: DC

## 2018-03-28 NOTE — Patient Instructions (Signed)
Limit your sodium (Salt) intake  Please check your blood pressure on a regular basis.  If it is consistently greater than 150/90, please make an office appointment.    It is important that you exercise regularly, at least 20 minutes 3 to 4 times per week.  If you develop chest pain or shortness of breath seek  medical attention.  Return in 6 months for follow-up  

## 2018-03-28 NOTE — Progress Notes (Signed)
Subjective:    Patient ID: John Riley, male    DOB: Nov 18, 1931, 84 y.o.   MRN: [id number]  HPI  Lab Results  Component Value Date   HGBA1C 5.7 09/27/2017    84 year old patient who is seen today for his biannual follow-up.  He has a history of essential hypertension prostate cancer as well as impaired glucose tolerance. Doing quite well.  Patient does not desire any further follow-up or any potential treatment for prostate cancer. Doing quite well without concerns or complaints  Past Medical History:  Diagnosis Date  . ELEVATED PROSTATE SPECIFIC ANTIGEN 07/31/2009  . HYPERLIPIDEMIA 06/28/2007  . HYPERTENSION 06/28/2007  . INSOMNIA 12/07/2007  . Non-small cell lung cancer ([place])   . PULMONARY NODULE, RIGHT MIDDLE LOBE 12/07/2007  . TOBACCO ABUSE 11/09/2007     Social History   Socioeconomic History  . Marital status: Widowed    Spouse name: Not on file  . Number of children: Not on file  . Years of education: Not on file  . Highest education level: Not on file  Occupational History  . Not on file  Social Needs  . Financial resource strain: Not on file  . Food insecurity:    Worry: Not on file    Inability: Not on file  . Transportation needs:    Medical: Not on file    Non-medical: Not on file  Tobacco Use  . Smoking status: Former Smoker    Last attempt to quit: 08/18/2007    Years since quitting: 10.6  . Smokeless tobacco: Never Used  Substance and Sexual Activity  . Alcohol use: No  . Drug use: No  . Sexual activity: Not on file  Lifestyle  . Physical activity:    Days per week: Not on file    Minutes per session: Not on file  . Stress: Not on file  Relationships  . Social connections:    Talks on phone: Not on file    Gets together: Not on file    Attends religious service: Not on file    Active member of club or organization: Not on file    Attends meetings of clubs or organizations: Not on file    Relationship status: Not on file  . Intimate  partner violence:    Fear of current or ex partner: Not on file    Emotionally abused: Not on file    Physically abused: Not on file    Forced sexual activity: Not on file  Other Topics Concern  . Not on file  Social History Narrative  . Not on file    Past Surgical History:  Procedure Laterality Date  . APPENDECTOMY    . LOBECTOMY     right middle  . PROSTATE SURGERY     bx  . VEIN LIGATION AND STRIPPING      History reviewed. No pertinent family history.  Allergies  Allergen Reactions  . Niacin     Current Outpatient Medications on File Prior to Visit  Medication Sig Dispense Refill  . acetaminophen (TYLENOL) 325 MG tablet Take 2 tablets (650 mg total) by mouth every 6 (six) hours as needed. 240 tablet 5  . aspirin 81 MG tablet Take 1 tablet (81 mg total) by mouth daily. 30 tablet   . atenolol (TENORMIN) 100 MG tablet Take 1 tablet (100 mg total) by mouth daily. 90 tablet 1  . lisinopril (PRINIVIL,ZESTRIL) 40 MG tablet Take 1 tablet (40 mg total) by mouth daily. 90 tablet  1  . simvastatin (ZOCOR) 40 MG tablet Take 1 tablet (40 mg total) by mouth at bedtime. 90 tablet 1   No current facility-administered medications on file prior to visit.     BP 120/70 (BP Location: Right Arm, Patient Position: Sitting, Cuff Size: Large)   Pulse (!) 58   Temp 98.3 F (36.8 C) (Oral)   Wt 231 lb 12.8 oz (105.1 kg)   SpO2 93%   BMI 31.44 kg/m     Review of Systems  Constitutional: Negative for appetite change, chills, fatigue and fever.  HENT: Negative for congestion, dental problem, ear pain, hearing loss, sore throat, tinnitus, trouble swallowing and voice change.   Eyes: Negative for pain, discharge and visual disturbance.  Respiratory: Negative for cough, chest tightness, wheezing and stridor.   Cardiovascular: Positive for leg swelling. Negative for chest pain and palpitations.  Gastrointestinal: Negative for abdominal distention, abdominal pain, blood in stool,  constipation, diarrhea, nausea and vomiting.  Genitourinary: Negative for difficulty urinating, discharge, flank pain, genital sores, hematuria and urgency.  Musculoskeletal: Negative for arthralgias, back pain, gait problem, joint swelling, myalgias and neck stiffness.  Skin: Negative for rash.  Neurological: Negative for dizziness, syncope, speech difficulty, weakness, numbness and headaches.  Hematological: Negative for adenopathy. Does not bruise/bleed easily.  Psychiatric/Behavioral: Negative for behavioral problems and dysphoric mood. The patient is not nervous/anxious.        Objective:   Physical Exam  Constitutional: He is oriented to person, place, and time. He appears well-developed.  Blood pressure 120/70  HENT:  Head: Normocephalic.  Right Ear: External ear normal.  Left Ear: External ear normal.  Eyes: Conjunctivae and EOM are normal.  Neck: Normal range of motion.  Cardiovascular: Normal rate and normal heart sounds.  Pulmonary/Chest: Breath sounds normal.  Abdominal: Bowel sounds are normal.  Musculoskeletal: Normal range of motion. He exhibits edema. He exhibits no tenderness.  Ankle and pedal edema right greater than left.  Chronic  Neurological: He is alert and oriented to person, place, and time.  Psychiatric: He has a normal mood and affect. His behavior is normal.          Assessment & Plan:   Essential hypertension well controlled.  All medications refilled Impaired glucose tolerance History of prostate cancer.  Patient does not desire any further follow-up or any treatment  Patient will follow-up in 6 months for an annual exam with a new provider  Marletta Lor

## 2018-04-27 ENCOUNTER — Ambulatory Visit: Payer: Medicare HMO | Admitting: Internal Medicine

## 2018-06-29 ENCOUNTER — Encounter: Payer: Self-pay | Admitting: Adult Health

## 2018-06-29 ENCOUNTER — Ambulatory Visit (INDEPENDENT_AMBULATORY_CARE_PROVIDER_SITE_OTHER): Payer: Medicare HMO | Admitting: Adult Health

## 2018-06-29 VITALS — BP 146/78 | Temp 97.7°F | Wt 234.0 lb

## 2018-06-29 DIAGNOSIS — I10 Essential (primary) hypertension: Secondary | ICD-10-CM | POA: Diagnosis not present

## 2018-06-29 DIAGNOSIS — Z7689 Persons encountering health services in other specified circumstances: Secondary | ICD-10-CM

## 2018-06-29 DIAGNOSIS — C61 Malignant neoplasm of prostate: Secondary | ICD-10-CM

## 2018-06-29 DIAGNOSIS — E785 Hyperlipidemia, unspecified: Secondary | ICD-10-CM | POA: Diagnosis not present

## 2018-06-29 NOTE — Progress Notes (Signed)
Patient presents to clinic today to establish care. He is a pleasant 84 year old male who  has a past medical history of [place] (07/31/2009), HYPERLIPIDEMIA (06/28/2007), HYPERTENSION (06/28/2007), INSOMNIA (12/07/2007), Non-small cell lung cancer ([place]), PULMONARY NODULE, RIGHT MIDDLE LOBE (12/07/2007), and TOBACCO ABUSE (11/09/2007).  He is a former patient of Dr. Raliegh Ip who I am seeing today to transfer care.   His last CPE was in Feb 2019 and last visit with Dr. Raliegh Ip was in August 2019    Acute Concerns: Establish Care  Chronic Issues:  Prostate Cancer - no longer willing to go through treatments   Hypertension - controlled with lisinopril 40 and atenolol 100 mg. Has not had his medications today  BP Readings from Last 3 Encounters:  06/29/18 (!) 146/78  03/28/18 120/70  09/27/17 110/68   Hyperlipidemia - Controlled with simvastatin 40 mg  Lab Results  Component Value Date   CHOL 99 09/27/2017   HDL 36.30 (L) 09/27/2017   LDLCALC 39 09/27/2017   TRIG 119.0 09/27/2017   CHOLHDL 3 09/27/2017   H/o of lung cancer in 2009 - Has been smoke free for 10 years   Health Maintenance: Dental -- No teeth  Vision -- Not routine  Immunizations -- Due for seasonal flu.  Colonoscopy -- No longer needs  Past Medical History:  Diagnosis Date  . ELEVATED PROSTATE SPECIFIC ANTIGEN 07/31/2009  . HYPERLIPIDEMIA 06/28/2007  . HYPERTENSION 06/28/2007  . INSOMNIA 12/07/2007  . Non-small cell lung cancer ([place])   . PULMONARY NODULE, RIGHT MIDDLE LOBE 12/07/2007  . TOBACCO ABUSE 11/09/2007    Past Surgical History:  Procedure Laterality Date  . APPENDECTOMY    . LOBECTOMY     right middle  . PROSTATE SURGERY     bx  . VEIN LIGATION AND STRIPPING      Current Outpatient Medications on File Prior to Visit  Medication Sig Dispense Refill  . acetaminophen (TYLENOL) 325 MG tablet Take 2 tablets (650 mg total) by mouth every 6 (six) hours as needed. 240 tablet 5  .  aspirin 81 MG tablet Take 1 tablet (81 mg total) by mouth daily. 30 tablet   . atenolol (TENORMIN) 100 MG tablet Take 1 tablet (100 mg total) by mouth daily. 90 tablet 4  . lisinopril (PRINIVIL,ZESTRIL) 40 MG tablet Take 1 tablet (40 mg total) by mouth daily. 90 tablet 4  . simvastatin (ZOCOR) 40 MG tablet Take 1 tablet (40 mg total) by mouth at bedtime. 90 tablet 4   No current facility-administered medications on file prior to visit.     Allergies  Allergen Reactions  . Niacin     History reviewed. No pertinent family history.  Social History   Socioeconomic History  . Marital status: Widowed    Spouse name: Not on file  . Number of children: Not on file  . Years of education: Not on file  . Highest education level: Not on file  Occupational History  . Not on file  Social Needs  . Financial resource strain: Not on file  . Food insecurity:    Worry: Not on file    Inability: Not on file  . Transportation needs:    Medical: Not on file    Non-medical: Not on file  Tobacco Use  . Smoking status: Former Smoker    Last attempt to quit: 08/18/2007    Years since quitting: 10.8  . Smokeless tobacco: Never Used  Substance and Sexual Activity  .  Alcohol use: No  . Drug use: No  . Sexual activity: Not on file  Lifestyle  . Physical activity:    Days per week: Not on file    Minutes per session: Not on file  . Stress: Not on file  Relationships  . Social connections:    Talks on phone: Not on file    Gets together: Not on file    Attends religious service: Not on file    Active member of club or organization: Not on file    Attends meetings of clubs or organizations: Not on file    Relationship status: Not on file  . Intimate partner violence:    Fear of current or ex partner: Not on file    Emotionally abused: Not on file    Physically abused: Not on file    Forced sexual activity: Not on file  Other Topics Concern  . Not on file  Social History Narrative  . Not  on file    Review of Systems  Constitutional: Negative.   Eyes: Negative.   Respiratory: Negative.   Cardiovascular: Negative.   Gastrointestinal: Negative.   Genitourinary: Negative.   Musculoskeletal: Positive for joint pain (bilateral knees).  Neurological: Negative.   Endo/Heme/Allergies: Negative.   Psychiatric/Behavioral: Negative.   All other systems reviewed and are negative.    BP (!) 146/78   Temp 97.7 F (36.5 C)   Wt 234 lb (106.1 kg)   BMI 31.74 kg/m    Physical Exam  Constitutional: He is oriented to person, place, and time. He appears well-developed and well-nourished. No distress.  HENT:  Mouth/Throat: Uvula is midline, oropharynx is clear and moist and mucous membranes are normal. Abnormal dentition.  Cardiovascular: Normal rate, regular rhythm, normal heart sounds and intact distal pulses.  Pulmonary/Chest: Effort normal and breath sounds normal.  Musculoskeletal: Normal range of motion.  Walks with single prong cane  Neurological: He is alert and oriented to person, place, and time.  Skin: Skin is warm and dry. Capillary refill takes less than 2 seconds. He is not diaphoretic.  Psychiatric: He has a normal mood and affect. His behavior is normal. Judgment and thought content normal.  Nursing note and vitals reviewed.   Assessment/Plan: 1. Encounter to establish care - Follow up in Feb 2020 for CPE  - Follow up sooner if needed  2. Essential hypertension - Not at goal today but appears at goal when he takes his meds - Will continue to monitor   3. Prostate cancer ([place]) - Continues to refuse treatment   4. Dyslipidemia - Well controlled on medications  - No change in medications   Dorothyann Peng, NP

## 2018-07-19 ENCOUNTER — Ambulatory Visit: Payer: Medicare HMO | Admitting: Adult Health

## 2018-08-26 ENCOUNTER — Encounter: Payer: Self-pay | Admitting: Adult Health

## 2018-08-26 ENCOUNTER — Ambulatory Visit (INDEPENDENT_AMBULATORY_CARE_PROVIDER_SITE_OTHER): Payer: Medicare HMO | Admitting: Adult Health

## 2018-08-26 VITALS — BP 122/78 | Temp 97.7°F | Wt 231.0 lb

## 2018-08-26 DIAGNOSIS — L918 Other hypertrophic disorders of the skin: Secondary | ICD-10-CM | POA: Diagnosis not present

## 2018-08-26 NOTE — Progress Notes (Signed)
S: The patient complains of symptomatic skin tag on the right cheek. These are irritated by clothing and shaving  O: Patient appears well. one benign skin tags are noted on the right cheek  A: Skin tags   P: patient consented to procedure. Skin tags are snipped off using Betadine for cleansing and sterile iris scissors. Local anesthesia was  used. These pathognomonic lesions are not sent for pathology. Patient tolerated procedure well.    Dorothyann Peng, NP

## 2018-09-30 ENCOUNTER — Encounter: Payer: Self-pay | Admitting: Adult Health

## 2018-09-30 ENCOUNTER — Ambulatory Visit (INDEPENDENT_AMBULATORY_CARE_PROVIDER_SITE_OTHER): Payer: Medicare HMO | Admitting: Adult Health

## 2018-09-30 VITALS — BP 138/78 | Temp 98.9°F | Ht 72.0 in | Wt 227.0 lb

## 2018-09-30 DIAGNOSIS — I10 Essential (primary) hypertension: Secondary | ICD-10-CM

## 2018-09-30 DIAGNOSIS — G4700 Insomnia, unspecified: Secondary | ICD-10-CM | POA: Diagnosis not present

## 2018-09-30 DIAGNOSIS — E785 Hyperlipidemia, unspecified: Secondary | ICD-10-CM

## 2018-09-30 DIAGNOSIS — R7302 Impaired glucose tolerance (oral): Secondary | ICD-10-CM

## 2018-09-30 DIAGNOSIS — Z0000 Encounter for general adult medical examination without abnormal findings: Secondary | ICD-10-CM | POA: Diagnosis not present

## 2018-09-30 LAB — COMPREHENSIVE METABOLIC PANEL
ALT: 12 U/L (ref 0–53)
AST: 16 U/L (ref 0–37)
Albumin: 3.9 g/dL (ref 3.5–5.2)
Alkaline Phosphatase: 59 U/L (ref 39–117)
BILIRUBIN TOTAL: 1.8 mg/dL — AB (ref 0.2–1.2)
BUN: 16 mg/dL (ref 6–23)
CALCIUM: 8.8 mg/dL (ref 8.4–10.5)
CO2: 29 meq/L (ref 19–32)
CREATININE: 0.74 mg/dL (ref 0.40–1.50)
Chloride: 101 mEq/L (ref 96–112)
GFR: 100.7 mL/min (ref >60.00)
Glucose, Bld: 111 mg/dL — ABNORMAL HIGH (ref 70–99)
Potassium: 4.9 mEq/L (ref 3.5–5.1)
Sodium: 140 mEq/L (ref 135–145)
Total Protein: 6.8 g/dL (ref 6.0–8.3)

## 2018-09-30 LAB — LIPID PANEL
Cholesterol: 99 mg/dL (ref 0–200)
HDL: 42.6 mg/dL (ref >39.00)
LDL Cholesterol: 40 mg/dL (ref 0–99)
NonHDL: 56.08
Total CHOL/HDL Ratio: 2
Triglycerides: 79 mg/dL (ref 0.0–149.0)
VLDL: 15.8 mg/dL (ref 0.0–40.0)

## 2018-09-30 LAB — CBC WITH DIFFERENTIAL/PLATELET
BASOS ABS: 0.1 10*3/uL (ref 0.0–0.1)
Basophils Relative: 0.6 % (ref 0.0–3.0)
EOS ABS: 0.1 10*3/uL (ref 0.0–0.7)
Eosinophils Relative: 0.6 % (ref 0.0–5.0)
HEMATOCRIT: 42.6 % (ref 39.0–52.0)
HEMOGLOBIN: 14.6 g/dL (ref 13.0–17.0)
Lymphs Abs: 0.6 10*3/uL — ABNORMAL LOW (ref 0.7–4.0)
MCHC: 34.3 g/dL (ref 30.0–36.0)
MCV: 95.4 fl (ref 78.0–100.0)
Monocytes Absolute: 1 10*3/uL (ref 0.1–1.0)
Monocytes Relative: 10.3 % (ref 3.0–12.0)
NEUTROS ABS: 7.9 10*3/uL — AB (ref 1.4–7.7)
Neutrophils Relative %: 82.1 % — ABNORMAL HIGH (ref 43.0–77.0)
PLATELETS: 214 10*3/uL (ref 150.0–400.0)
RBC: 4.47 Mil/uL (ref 4.22–5.81)
RDW: 12.4 % (ref 11.5–15.5)
WBC: 9.6 10*3/uL (ref 4.0–10.5)

## 2018-09-30 LAB — TSH: TSH: 1.28 u[IU]/mL (ref 0.35–4.50)

## 2018-09-30 MED ORDER — TRAZODONE HCL 50 MG PO TABS: 25.0000 mg | ORAL_TABLET | Freq: Every evening | ORAL | 1 refills | Status: DC | PRN

## 2018-09-30 NOTE — Progress Notes (Signed)
Subjective:    Patient ID: John Riley, male    DOB: [birth year], 85 y.o.   MRN: [id number]  HPI  Patient presents for yearly preventative medicine examination. Pleasant 85 year old male who  has a past medical history of ELEVATED PROSTATE SPECIFIC ANTIGEN (07/31/2009), HYPERLIPIDEMIA (06/28/2007), HYPERTENSION (06/28/2007), INSOMNIA (12/07/2007), Non-small cell lung cancer ([place]) (2009), PULMONARY NODULE, RIGHT MIDDLE LOBE (12/07/2007), and TOBACCO ABUSE (11/09/2007).   H/o Prostate Cancer -he is no longer willing to go through treatments or follow ups.   Hypertension -controlled with lisinopril 40 mg and atenolol 100 mg daily.  Denies lightheadedness, dizziness, chest pain, shortness of breath  Hyperlipidemia -well controlled with simvastatin 40 mg  H/o of lung cancer in 2009 - Has been smoke free for 10 years   Insomnia - this is biggest complaint and has been a long standing issue. He has trouble staying asleep and has trouble falling asleep. He feels as though he only gets 2-3 hours of sleep a night and then he wakes up and is not always able to go back to sleep. Denies anxiety or feeling as though he is waking up gasping for breath. Hehas tried various OTC medications without improvement in sleep   All immunizations and health maintenance protocols were reviewed with the patient and needed orders were placed. Needs tetanus booster but insurance will not pay for it   Appropriate screening laboratory values were ordered for the patient including screening of hyperlipidemia, renal function and hepatic function.  Medication reconciliation,  past medical history, social history, problem list and allergies were reviewed in detail with the patient  Goals were established with regard to weight loss, exercise, and  diet in compliance with medications  End of life planning was discussed. He has an advanced directive and living will.   Does not participate in routine dental or vision  screens.  He is no longer in need of a screening colonoscopy  Review of Systems  Constitutional: Negative.   HENT: Negative.   Eyes: Negative.   Respiratory: Negative.   Cardiovascular: Positive for leg swelling.  Gastrointestinal: Negative.   Endocrine: Negative.   Genitourinary: Negative.   Musculoskeletal: Positive for arthralgias and gait problem.  Skin: Negative.   Allergic/Immunologic: Negative.   Hematological: Negative.   Psychiatric/Behavioral: Negative.   All other systems reviewed and are negative.  Past Medical History:  Diagnosis Date  . ELEVATED PROSTATE SPECIFIC ANTIGEN 07/31/2009  . HYPERLIPIDEMIA 06/28/2007  . HYPERTENSION 06/28/2007  . INSOMNIA 12/07/2007  . Non-small cell lung cancer ([place]) 2009  . PULMONARY NODULE, RIGHT MIDDLE LOBE 12/07/2007  . TOBACCO ABUSE 11/09/2007    Social History   Socioeconomic History  . Marital status: Widowed    Spouse name: Not on file  . Number of children: Not on file  . Years of education: Not on file  . Highest education level: Not on file  Occupational History  . Not on file  Social Needs  . Financial resource strain: Not on file  . Food insecurity:    Worry: Not on file    Inability: Not on file  . Transportation needs:    Medical: Not on file    Non-medical: Not on file  Tobacco Use  . Smoking status: Former Smoker    Last attempt to quit: 08/18/2007    Years since quitting: 11.1  . Smokeless tobacco: Never Used  Substance and Sexual Activity  . Alcohol use: No  . Drug use: No  . Sexual activity: Not  on file  Lifestyle  . Physical activity:    Days per week: Not on file    Minutes per session: Not on file  . Stress: Not on file  Relationships  . Social connections:    Talks on phone: Not on file    Gets together: Not on file    Attends religious service: Not on file    Active member of club or organization: Not on file    Attends meetings of clubs or organizations: Not on file    Relationship  status: Not on file  . Intimate partner violence:    Fear of current or ex partner: Not on file    Emotionally abused: Not on file    Physically abused: Not on file    Forced sexual activity: Not on file  Other Topics Concern  . Not on file  Social History Narrative   Retired [employer]in [employer]     Past Surgical History:  Procedure Laterality Date  . APPENDECTOMY    . LOBECTOMY     right middle  . PROSTATE SURGERY     bx  . VEIN LIGATION AND STRIPPING Left 1974    History reviewed. No pertinent family history.  Allergies  Allergen Reactions  . Niacin     Current Outpatient Medications on File Prior to Visit  Medication Sig Dispense Refill  . acetaminophen (TYLENOL) 325 MG tablet Take 2 tablets (650 mg total) by mouth every 6 (six) hours as needed. 240 tablet 5  . atenolol (TENORMIN) 100 MG tablet Take 1 tablet (100 mg total) by mouth daily. 90 tablet 4  . lisinopril (PRINIVIL,ZESTRIL) 40 MG tablet Take 1 tablet (40 mg total) by mouth daily. 90 tablet 4  . simvastatin (ZOCOR) 40 MG tablet Take 1 tablet (40 mg total) by mouth at bedtime. 90 tablet 4   No current facility-administered medications on file prior to visit.     BP 138/78   Temp 98.9 F (37.2 C)   Ht 6' (1.829 m)   Wt 227 lb (103 kg)   BMI 30.79 kg/m       Objective:   Physical Exam Vitals signs and nursing note reviewed.  Constitutional:      General: He is not in acute distress.    Appearance: Normal appearance. He is well-developed. He is obese. He is not diaphoretic.  HENT:     Head: Normocephalic and atraumatic.     Right Ear: Tympanic membrane, ear canal and external ear normal. There is no impacted cerumen.     Left Ear: Tympanic membrane, ear canal and external ear normal. There is no impacted cerumen.     Nose: Nose normal. No congestion or rhinorrhea.     Mouth/Throat:     Mouth: Mucous membranes are moist.     Dentition: Abnormal dentition.     Pharynx: Oropharynx is clear. No  oropharyngeal exudate or posterior oropharyngeal erythema.  Eyes:     General:        Right eye: No discharge.        Left eye: No discharge.     Extraocular Movements: Extraocular movements intact.     Conjunctiva/sclera: Conjunctivae normal.     Pupils: Pupils are equal, round, and reactive to light.  Neck:     Musculoskeletal: Normal range of motion and neck supple. No neck rigidity or muscular tenderness.     Thyroid: No thyromegaly.     Vascular: No carotid bruit.     Trachea: No  tracheal deviation.  Cardiovascular:     Rate and Rhythm: Normal rate and regular rhythm.     Heart sounds: Normal heart sounds. No murmur. No friction rub. No gallop.   Pulmonary:     Effort: Pulmonary effort is normal. No respiratory distress.     Breath sounds: Normal breath sounds. No stridor. No wheezing, rhonchi or rales.  Chest:     Chest wall: No tenderness.  Abdominal:     General: Bowel sounds are normal. There is no distension.     Palpations: Abdomen is soft. There is no mass.     Tenderness: There is no abdominal tenderness. There is no right CVA tenderness, left CVA tenderness, guarding or rebound.     Hernia: No hernia is present.  Musculoskeletal: Normal range of motion.        General: No swelling, tenderness, deformity or signs of injury.     Right lower leg: Edema present.     Left lower leg: Edema present.  Lymphadenopathy:     Cervical: No cervical adenopathy.  Skin:    General: Skin is warm and dry.     Capillary Refill: Capillary refill takes less than 2 seconds.     Coloration: Skin is not jaundiced or pale.     Findings: No bruising, erythema, lesion or rash.  Neurological:     General: No focal deficit present.     Mental Status: He is alert and oriented to person, place, and time.     Cranial Nerves: No cranial nerve deficit.     Coordination: Coordination normal.     Comments: Uses cane  Psychiatric:        Mood and Affect: Mood normal.        Behavior: Behavior  normal.        Thought Content: Thought content normal.        Judgment: Judgment normal.        Assessment & Plan:  1. Routine general medical examination at a health care facility - Needs to work on moderate weight loss through diet and exercise - Follow up in one year or sooner if needed - CBC with Differential/Platelet - Comprehensive metabolic panel - Lipid panel - TSH  2. Dyslipidemia - Consider change in statin  - CBC with Differential/Platelet - Comprehensive metabolic panel - Lipid panel - TSH  3. Essential hypertension - At goal. No change in medications - CBC with Differential/Platelet - Comprehensive metabolic panel - Lipid panel - TSH  4. Impaired glucose tolerance  - CBC with Differential/Platelet - Comprehensive metabolic panel - Lipid panel - TSH  5. Insomnia, unspecified type - Will trial him on trazodone. We reviewed side effects and that he has an increased chance of falls. Advised to get out of bed slowly  - traZODone (DESYREL) 50 MG tablet; Take 0.5-1 tablets (25-50 mg total) by mouth at bedtime as needed for up to 90 doses for sleep.  Dispense: 90 tablet; Refill: 1  Dorothyann Peng, NP

## 2018-09-30 NOTE — Patient Instructions (Signed)
It was great seeing you today   I have sent in a medication called Trazodone to help you sleep. Take 1/5-1 tab at night   We will follow up with you regarding your blood work

## 2018-10-14 ENCOUNTER — Other Ambulatory Visit: Payer: Medicare HMO

## 2019-03-31 ENCOUNTER — Ambulatory Visit: Payer: Medicare HMO | Admitting: Adult Health

## 2019-04-07 ENCOUNTER — Other Ambulatory Visit: Payer: Self-pay

## 2019-04-07 ENCOUNTER — Encounter: Payer: Self-pay | Admitting: Adult Health

## 2019-04-07 ENCOUNTER — Ambulatory Visit (INDEPENDENT_AMBULATORY_CARE_PROVIDER_SITE_OTHER): Payer: Medicare HMO | Admitting: Adult Health

## 2019-04-07 VITALS — BP 134/84 | Temp 97.7°F | Wt 229.0 lb

## 2019-04-07 DIAGNOSIS — G4700 Insomnia, unspecified: Secondary | ICD-10-CM | POA: Diagnosis not present

## 2019-04-07 DIAGNOSIS — I10 Essential (primary) hypertension: Secondary | ICD-10-CM | POA: Diagnosis not present

## 2019-04-07 DIAGNOSIS — E785 Hyperlipidemia, unspecified: Secondary | ICD-10-CM

## 2019-04-07 MED ORDER — SIMVASTATIN 40 MG PO TABS: 40.0000 mg | ORAL_TABLET | Freq: Every day | ORAL | 1 refills | Status: DC

## 2019-04-07 MED ORDER — ATENOLOL 100 MG PO TABS: 100.0000 mg | ORAL_TABLET | Freq: Every day | ORAL | 1 refills | Status: DC

## 2019-04-07 MED ORDER — LISINOPRIL 40 MG PO TABS: 40.0000 mg | ORAL_TABLET | Freq: Every day | ORAL | 1 refills | Status: DC

## 2019-04-07 NOTE — Progress Notes (Signed)
Subjective:    Patient ID: John Riley, male    DOB: 1932-03-04, 85 y.o.   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  He  Presents to the clinic today for follow up. When he was last seen in February he was started on Trazodone 50 mg for insomnia. He did not end up taking this medication due to side effects that he read about on the internet. He reports that he made some changes at home, snacking less, and watching less TV before bed and is now sleeping better.   He also needs his medications renewed  Review of Systems  See HPI   Past Medical History:  Diagnosis Date  . ELEVATED PROSTATE SPECIFIC ANTIGEN 07/31/2009  . HYPERLIPIDEMIA 06/28/2007  . HYPERTENSION 06/28/2007  . INSOMNIA 12/07/2007  . Non-small cell lung cancer ([place]) 2009  . PULMONARY NODULE, RIGHT MIDDLE LOBE 12/07/2007  . TOBACCO ABUSE 11/09/2007    Social History   Socioeconomic History  . Marital status: Widowed    Spouse name: Not on file  . Number of children: Not on file  . Years of education: Not on file  . Highest education level: Not on file  Occupational History  . Not on file  Social Needs  . Financial resource strain: Not on file  . Food insecurity    Worry: Not on file    Inability: Not on file  . Transportation needs    Medical: Not on file    Non-medical: Not on file  Tobacco Use  . Smoking status: Former Smoker    Quit date: 08/18/2007    Years since quitting: 11.6  . Smokeless tobacco: Never Used  Substance and Sexual Activity  . Alcohol use: No  . Drug use: No  . Sexual activity: Not on file  Lifestyle  . Physical activity    Days per week: Not on file    Minutes per session: Not on file  . Stress: Not on  file  Relationships  . Social Herbalist on phone: Not on file    Gets together: Not on file    Attends religious service: Not on file    Active member of club or organization: Not on file    Attends meetings of clubs or organizations: Not on file    Relationship status: Not on file  . Intimate partner violence    Fear of current or ex partner: Not on file    Emotionally abused: Not on file    Physically abused: Not on file    Forced sexual activity: Not on file  Other Topics Concern  . Not on file  Social History Narrative   Retired Cabin crew     Past Surgical History:  Procedure Laterality Date  . APPENDECTOMY    . LOBECTOMY     right middle  . PROSTATE SURGERY     bx  . VEIN LIGATION AND STRIPPING Left 1974    History reviewed. No pertinent family history.  Allergies  Allergen Reactions  . Niacin     Current Outpatient Medications on File Prior to Visit  Medication Sig Dispense Refill  . acetaminophen (TYLENOL) 325 MG tablet Take 2 tablets (650 mg total) by mouth every 6 (six) hours as needed. 240 tablet  5   No current facility-administered medications on file prior to visit.     BP 134/84   Temp 97.7 F (36.5 C) (Temporal)   Wt 229 lb (103.9 kg)   BMI 31.06 kg/m       Objective:   Physical Exam Vitals signs and nursing note reviewed.  Constitutional:      Appearance: Normal appearance.  Cardiovascular:     Rate and Rhythm: Normal rate and regular rhythm.     Pulses: Normal pulses.     Heart sounds: Normal heart sounds.  Pulmonary:     Effort: Pulmonary effort is normal.     Breath sounds: Normal breath sounds.  Skin:    General: Skin is warm and dry.  Neurological:     General: No focal deficit present.     Mental Status: He is alert and oriented to person, place, and time.     Gait: Gait abnormal (walks with a cane ).  Psychiatric:        Mood and Affect: Mood normal.        Behavior: Behavior normal.        Thought  Content: Thought content normal.        Judgment: Judgment normal.       Assessment & Plan:  1. Insomnia, unspecified type - Improved with changes in sleep hygiene - Follow up in February for CPE   2. Essential hypertension  - lisinopril (ZESTRIL) 40 MG tablet; Take 1 tablet (40 mg total) by mouth daily.  Dispense: 90 tablet; Refill: 1 - atenolol (TENORMIN) 100 MG tablet; Take 1 tablet (100 mg total) by mouth daily.  Dispense: 90 tablet; Refill: 1  3. Dyslipidemia  - simvastatin (ZOCOR) 40 MG tablet; Take 1 tablet (40 mg total) by mouth at bedtime.  Dispense: 90 tablet; Refill: 1   Dorothyann Peng, NP

## 2019-04-07 NOTE — Patient Instructions (Addendum)
It was great seeing you today   I have sent all of your prescriptions if for the next six months   Please schedule your physical for after February 14th   If you need anything before that please let me know

## 2019-10-09 ENCOUNTER — Other Ambulatory Visit: Payer: Self-pay

## 2019-10-10 ENCOUNTER — Encounter: Payer: Self-pay | Admitting: Adult Health

## 2019-10-10 ENCOUNTER — Ambulatory Visit (INDEPENDENT_AMBULATORY_CARE_PROVIDER_SITE_OTHER): Payer: Medicare HMO | Admitting: Adult Health

## 2019-10-10 VITALS — BP 140/90 | HR 64 | Temp 97.9°F | Resp 17 | Ht 71.0 in | Wt 229.6 lb

## 2019-10-10 DIAGNOSIS — I10 Essential (primary) hypertension: Secondary | ICD-10-CM | POA: Diagnosis not present

## 2019-10-10 DIAGNOSIS — E785 Hyperlipidemia, unspecified: Secondary | ICD-10-CM

## 2019-10-10 DIAGNOSIS — C61 Malignant neoplasm of prostate: Secondary | ICD-10-CM | POA: Diagnosis not present

## 2019-10-10 DIAGNOSIS — Z0000 Encounter for general adult medical examination without abnormal findings: Secondary | ICD-10-CM | POA: Diagnosis not present

## 2019-10-10 DIAGNOSIS — C3490 Malignant neoplasm of unspecified part of unspecified bronchus or lung: Secondary | ICD-10-CM | POA: Diagnosis not present

## 2019-10-10 MED ORDER — LISINOPRIL 40 MG PO TABS: 40.0000 mg | ORAL_TABLET | Freq: Every day | ORAL | 3 refills | Status: DC

## 2019-10-10 MED ORDER — SIMVASTATIN 40 MG PO TABS: 40.0000 mg | ORAL_TABLET | Freq: Every day | ORAL | 3 refills | Status: DC

## 2019-10-10 MED ORDER — ATENOLOL 100 MG PO TABS: 100.0000 mg | ORAL_TABLET | Freq: Every day | ORAL | 3 refills | Status: DC

## 2019-10-10 NOTE — Patient Instructions (Signed)
It was great seeing you today   Please schedule your lab appointment in the next month   I have sent in all of your medications

## 2019-10-10 NOTE — Progress Notes (Signed)
Subjective:    Patient ID: John Riley, male    DOB: 17-Feb-1932, 86 y.o.   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  Hypertension -currently controlled with lisinopril 40 mg and atenolol 100 mg daily.  He denies lightheadedness, dizziness, chest pain, or shortness of breath. He does not monitor his BP at home.  BP Readings from Last 3 Encounters:  10/10/19 140/90  04/07/19 134/84  09/30/18 138/78   Hyperlipidemia -has been well controlled in the past with simvastatin 40 mg.  He denies myalgia or fatigue Lab Results  Component Value Date   CHOL 99 09/30/2018   HDL 42.60 09/30/2018   LDLCALC 40 09/30/2018   TRIG 79.0 09/30/2018   CHOLHDL 2 09/30/2018    H/o of prostate cancer - no longer being seen routine for follow up.   H/o lung cancer- in 2009. No longer following up routinely.   All immunizations and health maintenance protocols were reviewed with the patient and needed orders were placed. He is up to date on routine vaccinations   Appropriate screening laboratory values were ordered for the patient including screening of hyperlipidemia, renal function and hepatic function.   Medication reconciliation,  past medical history, social history, problem list and allergies were reviewed in detail with the patient  Goals were established with regard to weight loss, exercise, and  diet in compliance with medications  Wt Readings from Last 3 Encounters:  10/10/19 229 lb 9.6 oz (104.1 kg)  04/07/19 229 lb (103.9 kg)  09/30/18 227 lb (103 kg)    Review of Systems  Constitutional: Negative.   HENT: Negative.   Eyes:  Negative.   Respiratory: Negative.   Cardiovascular: Negative.   Gastrointestinal: Negative.   Endocrine: Negative.   Genitourinary: Negative.   Musculoskeletal: Negative.   Skin: Negative.   Allergic/Immunologic: Negative.   Neurological: Negative.   Hematological: Negative.   Psychiatric/Behavioral: Negative.   All other systems reviewed and are negative.  Past Medical History:  Diagnosis Date  . ELEVATED PROSTATE SPECIFIC ANTIGEN 07/31/2009  . HYPERLIPIDEMIA 06/28/2007  . HYPERTENSION 06/28/2007  . INSOMNIA 12/07/2007  . Non-small cell lung cancer ([place]) 2009  . PULMONARY NODULE, RIGHT MIDDLE LOBE 12/07/2007  . TOBACCO ABUSE 11/09/2007    Social History   Socioeconomic History  . Marital status: Widowed    Spouse name: Not on file  . Number of children: Not on file  . Years of education: Not on file  . Highest education level: Not on file  Occupational History  . Not on file  Tobacco Use  . Smoking status: Former Smoker    Quit date: 08/18/2007    Years since quitting: 12.1  . Smokeless tobacco: Never Used  Substance and Sexual Activity  . Alcohol use: No  . Drug use: No  . Sexual activity: Not on file  Other Topics Concern  . Not on file  Social History Narrative   Retired Cabin crew    Social Determinants of Radio broadcast assistant Strain:   . Difficulty of Paying Living Expenses: Not on file  Food Insecurity:   . Worried About Charity fundraiser in the Last Year: Not on file  . Ran Out of Food  in the Last Year: Not on file  Transportation Needs:   . Lack of Transportation (Medical): Not on file  . Lack of Transportation (Non-Medical): Not on file  Physical Activity:   . Days of Exercise per Week: Not on file  . Minutes of Exercise per Session: Not on file  Stress:   . Feeling of Stress : Not on file  Social Connections:   . Frequency of Communication with Friends and Family: Not on file  . Frequency of Social Gatherings with Friends and  Family: Not on file  . Attends Religious Services: Not on file  . Active Member of Clubs or Organizations: Not on file  . Attends Archivist Meetings: Not on file  . Marital Status: Not on file  Intimate Partner Violence:   . Fear of Current or Ex-Partner: Not on file  . Emotionally Abused: Not on file  . Physically Abused: Not on file  . Sexually Abused: Not on file    Past Surgical History:  Procedure Laterality Date  . APPENDECTOMY    . LOBECTOMY     right middle  . PROSTATE SURGERY     bx  . VEIN LIGATION AND STRIPPING Left 1974    History reviewed. No pertinent family history.  Allergies  Allergen Reactions  . Niacin     No current outpatient medications on file prior to visit.   No current facility-administered medications on file prior to visit.    BP 140/90 (BP Location: Left Arm, Patient Position: Sitting)   Pulse 64   Temp 97.9 F (36.6 C) (Temporal)   Resp 17   Ht 5\' 11"  (1.803 m)   Wt 229 lb 9.6 oz (104.1 kg)   SpO2 95%   BMI 32.02 kg/m       Objective:   Physical Exam Constitutional:      General: He is not in acute distress.    Appearance: He is well-developed. He is obese. He is not ill-appearing.  HENT:     Head: Normocephalic and atraumatic.     Right Ear: Tympanic membrane, ear canal and external ear normal. There is no impacted cerumen.     Left Ear: Tympanic membrane, ear canal and external ear normal. There is no impacted cerumen.     Nose: Nose normal. No congestion or rhinorrhea.     Mouth/Throat:     Mouth: Mucous membranes are moist.     Dentition: Abnormal dentition.     Tongue: No lesions. Tongue does not deviate from midline.     Palate: No mass and lesions.     Pharynx: Oropharynx is clear. No oropharyngeal exudate.  Eyes:     General:        Right eye: No discharge.        Left eye: No discharge.  Neck:     Trachea: No tracheal deviation.  Cardiovascular:     Rate and Rhythm: Normal rate.     Heart sounds:  Normal heart sounds. No murmur. No gallop.   Pulmonary:     Effort: Pulmonary effort is normal. No respiratory distress.     Breath sounds: Normal breath sounds. No stridor. No wheezing, rhonchi or rales.  Chest:     Chest wall: No tenderness.  Abdominal:     General: Bowel sounds are normal. There is no distension.     Palpations: Abdomen is soft. There is no mass.     Tenderness: There is no abdominal tenderness. There is no right CVA  tenderness, guarding or rebound.     Hernia: No hernia is present.  Musculoskeletal:        General: No tenderness. Normal range of motion.  Lymphadenopathy:     Cervical: No cervical adenopathy.  Skin:    General: Skin is warm and dry.     Coloration: Skin is not jaundiced.     Findings: No bruising, erythema, lesion or rash.  Neurological:     Mental Status: He is alert and oriented to person, place, and time.  Psychiatric:        Mood and Affect: Mood normal.        Behavior: Behavior normal.        Thought Content: Thought content normal.        Judgment: Judgment normal.       Assessment & Plan:  1. Routine general medical examination at a health care facility - Follow up in one year  - Continue to stay active and eat healthy  - Comprehensive metabolic panel; Future - Lipid panel; Future - TSH; Future  2. Essential hypertension - no change in medications   - atenolol (TENORMIN) 100 MG tablet; Take 1 tablet (100 mg total) by mouth daily.  Dispense: 90 tablet; Refill: 3 - lisinopril (ZESTRIL) 40 MG tablet; Take 1 tablet (40 mg total) by mouth daily.  Dispense: 90 tablet; Refill: 3 - CBC with Differential/Platelet; Future - Comprehensive metabolic panel; Future - Lipid panel; Future - TSH; Future  3. Dyslipidemia  - simvastatin (ZOCOR) 40 MG tablet; Take 1 tablet (40 mg total) by mouth at bedtime.  Dispense: 90 tablet; Refill: 3 - CBC with Differential/Platelet; Future - Comprehensive metabolic panel; Future - Lipid panel; Future  - TSH; Future  4. Prostate cancer ([place])  - PSA; Future  5. Non-small cell lung cancer, unspecified laterality ([place])  - DG Chest 2 View; Future   Dorothyann Peng, NP

## 2019-11-07 ENCOUNTER — Other Ambulatory Visit: Payer: Medicare HMO

## 2019-11-21 ENCOUNTER — Other Ambulatory Visit: Payer: Medicare HMO

## 2020-01-10 DIAGNOSIS — H02825 Cysts of left lower eyelid: Secondary | ICD-10-CM | POA: Diagnosis not present

## 2020-03-19 ENCOUNTER — Encounter: Payer: Self-pay | Admitting: Adult Health

## 2020-03-19 ENCOUNTER — Ambulatory Visit (INDEPENDENT_AMBULATORY_CARE_PROVIDER_SITE_OTHER): Payer: Medicare HMO | Admitting: Adult Health

## 2020-03-19 ENCOUNTER — Other Ambulatory Visit: Payer: Self-pay

## 2020-03-19 VITALS — BP 138/86 | Temp 98.6°F | Wt 226.0 lb

## 2020-03-19 DIAGNOSIS — I10 Essential (primary) hypertension: Secondary | ICD-10-CM | POA: Diagnosis not present

## 2020-03-19 DIAGNOSIS — E785 Hyperlipidemia, unspecified: Secondary | ICD-10-CM

## 2020-03-19 LAB — CBC WITH DIFFERENTIAL/PLATELET
Absolute Monocytes: 655 cells/uL (ref 200–950)
Basophils Absolute: 43 cells/uL (ref 0–200)
Basophils Relative: 0.6 %
Eosinophils Absolute: 158 cells/uL (ref 15–500)
Eosinophils Relative: 2.2 %
HCT: 47.7 % (ref 38.5–50.0)
Hemoglobin: 15.8 g/dL (ref 13.2–17.1)
Lymphs Abs: 792 cells/uL — ABNORMAL LOW (ref 850–3900)
MCH: 32.5 pg (ref 27.0–33.0)
MCHC: 33.1 g/dL (ref 32.0–36.0)
MCV: 98.1 fL (ref 80.0–100.0)
MPV: 12.5 fL (ref 7.5–12.5)
Monocytes Relative: 9.1 %
Neutro Abs: 5551 cells/uL (ref 1500–7800)
Neutrophils Relative %: 77.1 %
Platelets: 170 10*3/uL (ref 140–400)
RBC: 4.86 10*6/uL (ref 4.20–5.80)
RDW: 11.5 % (ref 11.0–15.0)
Total Lymphocyte: 11 %
WBC: 7.2 10*3/uL (ref 3.8–10.8)

## 2020-03-19 LAB — LIPID PANEL
Cholesterol: 101 mg/dL (ref <200)
HDL: 43 mg/dL (ref >=40)
LDL Cholesterol (Calc): 40 mg/dL (calc)
Non-HDL Cholesterol (Calc): 58 mg/dL (calc) (ref <130)
Total CHOL/HDL Ratio: 2.3 (calc) (ref <5.0)
Triglycerides: 97 mg/dL (ref <150)

## 2020-03-19 LAB — COMPLETE METABOLIC PANEL WITH GFR
AG Ratio: 1.5 (calc) (ref 1.0–2.5)
ALT: 14 U/L (ref 9–46)
AST: 16 U/L (ref 10–35)
Albumin: 4.2 g/dL (ref 3.6–5.1)
Alkaline phosphatase (APISO): 54 U/L (ref 35–144)
BUN: 17 mg/dL (ref 7–25)
CO2: 26 mmol/L (ref 20–32)
Calcium: 9.1 mg/dL (ref 8.6–10.3)
Chloride: 106 mmol/L (ref 98–110)
Creat: 0.82 mg/dL (ref 0.70–1.11)
GFR, Est African American: 93 mL/min/{1.73_m2} (ref >=60)
GFR, Est Non African American: 81 mL/min/{1.73_m2} (ref >=60)
Globulin: 2.8 g/dL (calc) (ref 1.9–3.7)
Glucose, Bld: 116 mg/dL — ABNORMAL HIGH (ref 65–99)
Potassium: 4.6 mmol/L (ref 3.5–5.3)
Sodium: 141 mmol/L (ref 135–146)
Total Bilirubin: 2 mg/dL — ABNORMAL HIGH (ref 0.2–1.2)
Total Protein: 7 g/dL (ref 6.1–8.1)

## 2020-03-19 LAB — TSH: TSH: 1.53 mIU/L (ref 0.40–4.50)

## 2020-03-19 NOTE — Progress Notes (Signed)
Subjective:    Patient ID: John Riley, male    DOB: [birth year], 86 y.o.   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  He presents to the office today for six month follow up hypertension and hyperlipidemia.  Hypertension-he is currently controlled with lisinopril 40 mg and atenolol 100 mg daily.  He denies dizziness, lightheadedness, chest pain, or shortness of breath.  He does not check his blood pressure at home  BP Readings from Last 3 Encounters:  03/19/20 138/86  10/10/19 140/90  04/07/19 134/84   Hyperlipidemia -has been well controlled in the past with simvastatin 40 mg.  He denies myalgia or fatigue. Lab Results  Component Value Date   CHOL 99 09/30/2018   HDL 42.60 09/30/2018   LDLCALC 40 09/30/2018   TRIG 79.0 09/30/2018   CHOLHDL 2 09/30/2018   He lives a pretty sedentary lifestyle, he does not exercise on a routine basis due to arthritic pain and does not necessarily eat a heart healthy diet.  He has no acute complaints today and states "I actually feel pretty good today".  Review of Systems  Constitutional: Negative.   HENT: Negative.   Respiratory: Negative.   Cardiovascular: Negative.   Gastrointestinal: Negative.   Genitourinary: Negative.   Musculoskeletal: Positive for arthralgias and gait problem.  Hematological: Negative.   All other systems reviewed and are negative.    Past Medical History:  Diagnosis Date   ELEVATED PROSTATE SPECIFIC ANTIGEN 07/31/2009   HYPERLIPIDEMIA 06/28/2007   HYPERTENSION 06/28/2007   INSOMNIA 12/07/2007   Non-small cell lung cancer ([place]) 2009   PULMONARY NODULE, RIGHT MIDDLE LOBE 12/07/2007   TOBACCO ABUSE 11/09/2007    Social  History   Socioeconomic History   Marital status: Widowed    Spouse name: Not on file   Number of children: Not on file   Years of education: Not on file   Highest education level: Not on file  Occupational History   Not on file  Tobacco Use   Smoking status: Former Smoker    Quit date: 08/18/2007    Years since quitting: 12.5   Smokeless tobacco: Never Used  Substance and Sexual Activity   Alcohol use: No   Drug use: No   Sexual activity: Not on file  Other Topics Concern   Not on file  Social History Narrative   Retired - Oncologist    Social Determinants of Radio broadcast assistant Strain:    Difficulty of Paying Living Expenses:   Food Insecurity:    Worried About Charity fundraiser in the Last Year:    Arboriculturist in the Last Year:   Transportation Needs:    Film/video editor (Medical):    Lack of Transportation (Non-Medical):   Physical Activity:    Days of Exercise per Week:    Minutes of Exercise per Session:   Stress:    Feeling of Stress :   Social Connections:    Frequency of Communication with Friends and Family:    Frequency of Social Gatherings with Friends and Family:    Attends Religious Services:    Active Member of Clubs or Organizations:    Attends Archivist Meetings:    Marital Status:   Intimate Partner Violence:  Fear of Current or Ex-Partner:    Emotionally Abused:    Physically Abused:    Sexually Abused:     Past Surgical History:  Procedure Laterality Date   APPENDECTOMY     LOBECTOMY     right middle   PROSTATE SURGERY     bx   VEIN LIGATION AND STRIPPING Left 1974    History reviewed. No pertinent family history.  Allergies  Allergen Reactions   Niacin     Current Outpatient Medications on File Prior to Visit  Medication Sig Dispense Refill   atenolol (TENORMIN) 100 MG tablet Take 1 tablet (100 mg total) by mouth daily. 90 tablet 3   lisinopril (ZESTRIL)  40 MG tablet Take 1 tablet (40 mg total) by mouth daily. 90 tablet 3   simvastatin (ZOCOR) 40 MG tablet Take 1 tablet (40 mg total) by mouth at bedtime. 90 tablet 3   No current facility-administered medications on file prior to visit.    BP 138/86    Temp 98.6 F (37 C)    Wt 226 lb (102.5 kg)    BMI 31.52 kg/m       Objective:   Physical Exam Vitals and nursing note reviewed.  Constitutional:      Appearance: Normal appearance. He is obese.  Cardiovascular:     Rate and Rhythm: Normal rate and regular rhythm.     Pulses: Normal pulses.     Heart sounds: Normal heart sounds.  Pulmonary:     Effort: Pulmonary effort is normal.     Breath sounds: Normal breath sounds.  Abdominal:     General: Abdomen is flat.     Palpations: Abdomen is soft.  Musculoskeletal:        General: Normal range of motion.  Skin:    General: Skin is warm and dry.  Neurological:     General: No focal deficit present.     Mental Status: He is alert and oriented to person, place, and time.     Comments: Walks with a cane   Psychiatric:        Mood and Affect: Mood normal.        Behavior: Behavior normal.        Thought Content: Thought content normal.        Judgment: Judgment normal.       Assessment & Plan:  1. Essential hypertension - BP well controlled.  - No change in BP meds - Encouraged exercise and heart healthy diet to help lose weight  - CBC with Differential/Platelet; Future - Comprehensive metabolic panel; Future - Lipid panel; Future - TSH; Future - CMP with eGFR([facility]); Future  2. Dyslipidemia - Consider increase in statin  - CBC with Differential/Platelet; Future - Comprehensive metabolic panel; Future - Lipid panel; Future - TSH; Future - CMP with eGFR([facility]); Future  Dorothyann Peng, NP

## 2020-03-27 ENCOUNTER — Encounter: Payer: Self-pay | Admitting: Family Medicine

## 2020-05-06 DIAGNOSIS — H524 Presbyopia: Secondary | ICD-10-CM | POA: Diagnosis not present

## 2020-05-06 DIAGNOSIS — H5203 Hypermetropia, bilateral: Secondary | ICD-10-CM | POA: Diagnosis not present

## 2020-05-06 DIAGNOSIS — H52209 Unspecified astigmatism, unspecified eye: Secondary | ICD-10-CM | POA: Diagnosis not present

## 2020-07-16 ENCOUNTER — Telehealth: Payer: Self-pay | Admitting: Adult Health

## 2020-07-16 NOTE — Telephone Encounter (Signed)
Spoke with pt 07/16/20 to schedule AWV.  Pt declined stating he was doing good and got his covid vaccine

## 2020-09-19 ENCOUNTER — Ambulatory Visit (INDEPENDENT_AMBULATORY_CARE_PROVIDER_SITE_OTHER): Payer: Medicare HMO | Admitting: Adult Health

## 2020-09-19 ENCOUNTER — Other Ambulatory Visit: Payer: Self-pay

## 2020-09-19 ENCOUNTER — Encounter: Payer: Self-pay | Admitting: Adult Health

## 2020-09-19 DIAGNOSIS — I10 Essential (primary) hypertension: Secondary | ICD-10-CM

## 2020-09-19 DIAGNOSIS — E785 Hyperlipidemia, unspecified: Secondary | ICD-10-CM | POA: Diagnosis not present

## 2020-09-19 MED ORDER — ATENOLOL 100 MG PO TABS: 100.0000 mg | ORAL_TABLET | Freq: Every day | ORAL | 3 refills | Status: DC

## 2020-09-19 MED ORDER — SIMVASTATIN 40 MG PO TABS: 40.0000 mg | ORAL_TABLET | Freq: Every day | ORAL | 3 refills | Status: DC

## 2020-09-19 MED ORDER — LISINOPRIL 40 MG PO TABS: 40.0000 mg | ORAL_TABLET | Freq: Every day | ORAL | 3 refills | Status: DC

## 2020-09-19 NOTE — Progress Notes (Signed)
Subjective:    Patient ID: John Riley, male    DOB: 1932/08/16, 87 y.o.   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  He presents to the office today for six month follow up. Overall he reports that he is doing pretty well. He has no acute complaints today. States " I feel good."   Hypertension -controlled with lisinopril 40 mg and atenolol 100 mg daily.  He denies lightheadedness, dizziness, chest pain, shortness of breath.  He does not monitor his blood pressure at home BP Readings from Last 3 Encounters:  09/19/20 132/80  03/19/20 138/86  10/10/19 140/90   Hyperlipidemia - takes simvastatin 40 mg daily. Denies myalgia or fatigue   Lab Results  Component Value Date   CHOL 101 03/19/2020   HDL 43 03/19/2020   LDLCALC 40 03/19/2020   TRIG 97 03/19/2020   CHOLHDL 2.3 03/19/2020   Osteoarthritis - multiple joints. He uses lidocaine patches and feels as though this controls his pain adequately.  He has not had any ER or hospital admissions over the last 6 months.  Denies falls or depression.  He does not leave the home much, and has a very sedentary lifestyle.  Does not follow a specific diet  Review of Systems  Constitutional: Negative.   Eyes: Negative.   Respiratory: Negative.   Cardiovascular: Negative.   Gastrointestinal: Negative.   Endocrine: Negative.   Musculoskeletal: Positive for arthralgias, back pain and gait problem.  Allergic/Immunologic: Negative.   Hematological: Negative.   All other systems reviewed and are negative.    Past Medical History:  Diagnosis Date  . ELEVATED PROSTATE SPECIFIC ANTIGEN 07/31/2009  . HYPERLIPIDEMIA 06/28/2007  . HYPERTENSION 06/28/2007  .  INSOMNIA 12/07/2007  . Non-small cell lung cancer ([place]) 2009  . PULMONARY NODULE, RIGHT MIDDLE LOBE 12/07/2007  . TOBACCO ABUSE 11/09/2007    Social History   Socioeconomic History  . Marital status: Widowed    Spouse name: Not on file  . Number of children: Not on file  . Years of education: Not on file  . Highest education level: Not on file  Occupational History  . Not on file  Tobacco Use  . Smoking status: Former Smoker    Quit date: 08/18/2007    Years since quitting: 13.0  . Smokeless tobacco: Never Used  Substance and Sexual Activity  . Alcohol use: No  . Drug use: No  . Sexual activity: Not on file  Other Topics Concern  . Not on file  Social History Narrative   Retired - Oncologist    Social Determinants of Radio broadcast assistant Strain: Not on [employer] Insecurity: Not on file  Transportation Needs: Not on file  Physical Activity: Not on file  Stress: Not on file  Social Connections: Not on file  Intimate Partner Violence: Not on file    Past Surgical History:  Procedure Laterality Date  . APPENDECTOMY    . LOBECTOMY     right middle  . PROSTATE SURGERY     bx  . VEIN LIGATION AND STRIPPING Left 1974    History reviewed. No pertinent family history.  Allergies  Allergen Reactions  . Niacin     No current outpatient medications on file prior to visit.  No current facility-administered medications on file prior to visit.    BP 132/80   Temp 98.5 F (36.9 C)   Ht 5' 11.5" (1.816 m) Comment: WITH SHOES  Wt 222 lb (100.7 kg)   BMI 30.53 kg/m       Objective:   Physical Exam Vitals and nursing note reviewed.  Constitutional:      Appearance: Normal appearance.  HENT:     Mouth/Throat:     Dentition: Has dentures.  Cardiovascular:     Rate and Rhythm: Normal rate and regular rhythm.     Pulses: Normal pulses.     Heart sounds: Normal heart sounds.  Pulmonary:     Effort: Pulmonary effort is normal.     Breath sounds:  Normal breath sounds.  Musculoskeletal:        General: Normal range of motion.  Skin:    General: Skin is warm and dry.     Capillary Refill: Capillary refill takes less than 2 seconds.  Neurological:     General: No focal deficit present.     Mental Status: He is alert and oriented to person, place, and time.     Gait: Gait abnormal (walks with single prong cane).  Psychiatric:        Mood and Affect: Mood normal.        Behavior: Behavior normal.        Thought Content: Thought content normal.        Judgment: Judgment normal.       Assessment & Plan:  1. Essential hypertension -Well-controlled.  We will have him follow-up in August for his CPE.  He can follow-up sooner if needed. - atenolol (TENORMIN) 100 MG tablet; Take 1 tablet (100 mg total) by mouth daily.  Dispense: 90 tablet; Refill: 3 - lisinopril (ZESTRIL) 40 MG tablet; Take 1 tablet (40 mg total) by mouth daily.  Dispense: 90 tablet; Refill: 3  2. Dyslipidemia -Patient ate before appointment, will forego cholesterol panel until this August when he returns for his CPE. - simvastatin (ZOCOR) 40 MG tablet; Take 1 tablet (40 mg total) by mouth at bedtime.  Dispense: 90 tablet; Refill: 3   Dorothyann Peng, NP

## 2020-09-19 NOTE — Patient Instructions (Addendum)
It was great seeing you today!   Please follow up after August 3rd for your physical exam   If you need anything, please let me know

## 2021-02-18 ENCOUNTER — Other Ambulatory Visit: Payer: Self-pay

## 2021-02-18 ENCOUNTER — Ambulatory Visit: Payer: Medicare HMO

## 2021-03-18 ENCOUNTER — Ambulatory Visit: Payer: Medicare HMO | Admitting: Adult Health

## 2021-03-18 NOTE — Progress Notes (Deleted)
Subjective:    Patient ID: John Riley, male    DOB: Jan 20, 1932, 87 y.o.   MRN: [id number]  HPI Patient presents for yearly preventative medicine examination. 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  Hypertension -controlled with lisinopril 40 mg daily and atenolol 100 mg daily.  He denies lightheadedness, dizziness, chest pain, shortness of breath.  He does not monitor his blood pressure at home BP Readings from Last 3 Encounters:  09/19/20 132/80  03/19/20 138/86  10/10/19 140/90   Hyperlipidemia - takes simvastatin 40 mg daily. Denies myalgia or fatigue.   Osteoarthritis -multiple joints.  He uses lidocaine patches and feels as though this controls his pain adequately.  All immunizations and health maintenance protocols were reviewed with the patient and needed orders were placed.  Appropriate screening laboratory values were ordered for the patient including screening of hyperlipidemia, renal function and hepatic function. If indicated by BPH, a PSA was ordered.  Medication reconciliation,  past medical history, social history, problem list and allergies were reviewed in detail with the patient  Goals were established with regard to weight loss, exercise, and  diet in compliance with medications  End of life planning was discussed.   Review of Systems  Constitutional: Negative.   HENT: Negative.    Eyes: Negative.   Respiratory: Negative.    Cardiovascular: Negative.   Gastrointestinal: Negative.   Endocrine: Negative.   Genitourinary: Negative.   Musculoskeletal:  Positive for arthralgias, back pain and gait problem.  Skin: Negative.   Allergic/Immunologic: Negative.   Hematological:  Negative.   Psychiatric/Behavioral: Negative.    All other systems reviewed and are negative.  Past Medical History:  Diagnosis Date   ELEVATED PROSTATE SPECIFIC ANTIGEN 07/31/2009   HYPERLIPIDEMIA 06/28/2007   HYPERTENSION 06/28/2007   INSOMNIA 12/07/2007   Non-small cell lung cancer ([place]) 2009   PULMONARY NODULE, RIGHT MIDDLE LOBE 12/07/2007   TOBACCO ABUSE 11/09/2007    Social History   Socioeconomic History   Marital status: Widowed    Spouse name: Not on file   Number of children: Not on file   Years of education: Not on file   Highest education level: Not on file  Occupational History   Not on file  Tobacco Use   Smoking status: Former    Types: Cigarettes    Quit date: 08/18/2007    Years since quitting: 13.5   Smokeless tobacco: Never  Substance and Sexual Activity   Alcohol use: No   Drug use: No   Sexual activity: Not on file  Other Topics Concern   Not on file  Social History Narrative   Retired - Oncologist    Social Determinants of Radio broadcast assistant Strain: Not on Art therapist Insecurity: Not on file  Transportation Needs: Not on file  Physical Activity: Not on file  Stress: Not on file  Social Connections: Not on file  Intimate Partner Violence: Not on file    Past Surgical History:  Procedure Laterality Date   APPENDECTOMY     LOBECTOMY     right middle   [place]    No family history on file.  Allergies  Allergen Reactions   Niacin  Current Outpatient Medications on File Prior to Visit  Medication Sig Dispense Refill   atenolol (TENORMIN) 100 MG tablet Take 1 tablet (100 mg total) by mouth daily. 90 tablet 3   lisinopril (ZESTRIL) 40 MG tablet Take 1 tablet (40 mg total) by mouth daily. 90 tablet 3   simvastatin (ZOCOR) 40 MG tablet Take 1 tablet (40 mg total) by mouth at bedtime. 90 tablet 3   No current facility-administered medications on file prior to visit.     There were no vitals taken for this visit.       Objective:   Physical Exam Vitals and nursing note reviewed.  Constitutional:      General: He is not in acute distress.    Appearance: Normal appearance. He is well-developed and normal weight.  HENT:     Head: Normocephalic and atraumatic.     Right Ear: Tympanic membrane, ear canal and external ear normal. There is no impacted cerumen.     Left Ear: Tympanic membrane, ear canal and external ear normal. There is no impacted cerumen.     Nose: Nose normal. No congestion or rhinorrhea.     Mouth/Throat:     Mouth: Mucous membranes are moist.     Pharynx: Oropharynx is clear. No oropharyngeal exudate or posterior oropharyngeal erythema.  Eyes:     General:        Right eye: No discharge.        Left eye: No discharge.     Extraocular Movements: Extraocular movements intact.     Conjunctiva/sclera: Conjunctivae normal.     Pupils: Pupils are equal, round, and reactive to light.  Neck:     Vascular: No carotid bruit.     Trachea: No tracheal deviation.  Cardiovascular:     Rate and Rhythm: Normal rate and regular rhythm.     Pulses: Normal pulses.     Heart sounds: Normal heart sounds. No murmur heard.   No friction rub. No gallop.  Pulmonary:     Effort: Pulmonary effort is normal. No respiratory distress.     Breath sounds: Normal breath sounds. No stridor. No wheezing, rhonchi or rales.  Chest:     Chest wall: No tenderness.  Abdominal:     General: Bowel sounds are normal. There is no distension.     Palpations: Abdomen is soft. There is no mass.     Tenderness: There is no abdominal tenderness. There is no right CVA tenderness, left CVA tenderness, guarding or rebound.     Hernia: No hernia is present.  Musculoskeletal:        General: No swelling, tenderness, deformity or signs of injury. Normal range of motion.     Right lower leg: No edema.     Left lower leg: No edema.  Lymphadenopathy:     Cervical: No  cervical adenopathy.  Skin:    General: Skin is warm and dry.     Capillary Refill: Capillary refill takes less than 2 seconds.     Coloration: Skin is not jaundiced or pale.     Findings: No bruising, erythema, lesion or rash.  Neurological:     General: No focal deficit present.     Mental Status: He is alert and oriented to person, place, and time.     Cranial Nerves: No cranial nerve deficit.     Sensory: No sensory deficit.     Motor: No weakness.     Coordination: Coordination normal.     Gait: Gait  normal.     Deep Tendon Reflexes: Reflexes normal.  Psychiatric:        Mood and Affect: Mood normal.        Behavior: Behavior normal.        Thought Content: Thought content normal.        Judgment: Judgment normal.      Assessment & Plan:

## 2021-03-21 ENCOUNTER — Ambulatory Visit: Payer: Medicare HMO | Admitting: Adult Health

## 2021-03-27 ENCOUNTER — Ambulatory Visit: Payer: Medicare HMO | Admitting: Adult Health

## 2021-04-29 ENCOUNTER — Telehealth: Payer: Self-pay | Admitting: Adult Health

## 2021-04-29 NOTE — Telephone Encounter (Signed)
Left message for patient to call back and schedule Medicare Annual Wellness Visit (AWV) either virtually or in office. Left  my John Riley number [phone]   Last AWV 09/27/17  please schedule at anytime with [facility] Nurse Health Advisor 1 or 2   This should be a 45 minute visit.

## 2021-04-29 NOTE — Telephone Encounter (Signed)
Correction tried calling patient voicemail no set up

## 2021-05-29 ENCOUNTER — Ambulatory Visit: Payer: Medicare HMO | Admitting: Adult Health

## 2021-06-23 ENCOUNTER — Other Ambulatory Visit: Payer: Self-pay

## 2021-06-24 ENCOUNTER — Encounter: Payer: Self-pay | Admitting: Adult Health

## 2021-06-24 ENCOUNTER — Ambulatory Visit (INDEPENDENT_AMBULATORY_CARE_PROVIDER_SITE_OTHER): Payer: Medicare HMO | Admitting: Adult Health

## 2021-06-24 VITALS — BP 120/60 | HR 62 | Temp 98.6°F | Ht 71.5 in | Wt 215.0 lb

## 2021-06-24 DIAGNOSIS — E785 Hyperlipidemia, unspecified: Secondary | ICD-10-CM

## 2021-06-24 DIAGNOSIS — Z0000 Encounter for general adult medical examination without abnormal findings: Secondary | ICD-10-CM | POA: Diagnosis not present

## 2021-06-24 DIAGNOSIS — Z23 Encounter for immunization: Secondary | ICD-10-CM

## 2021-06-24 DIAGNOSIS — I10 Essential (primary) hypertension: Secondary | ICD-10-CM

## 2021-06-24 NOTE — Patient Instructions (Signed)
It was great seeing you today   Please schedule a lab appointment   I will see you back in one year or sooner if needed

## 2021-06-24 NOTE — Progress Notes (Signed)
Subjective:    Patient ID: John Riley, male    DOB: 09-16-1931, 87 y.o.   MRN: [id number]  HPI Patient presents for yearly preventative medicine examination. 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  Hypertension-currently controlled with lisinopril 40 mg and atenolol 100 mg daily.  He denies lightheadedness, dizziness, chest pain, shortness of breath.  He does not monitor his blood pressure at home BP Readings from Last 3 Encounters:  06/24/21 120/60  09/19/20 132/80  03/19/20 138/86   Hyperlipidemia-managed with simvastatin 40 mg.  He denies myalgia or fatigue Lab Results  Component Value Date   CHOL 101 03/19/2020   HDL 43 03/19/2020   LDLCALC 40 03/19/2020   TRIG 97 03/19/2020   CHOLHDL 2.3 03/19/2020   History of prostate cancer-no longer being seen routine for follow-up  History of lung cancer-in 2009.  No longer required follow-up with pulmonary  All immunizations and health maintenance protocols were reviewed with the patient and needed orders were placed.  Appropriate screening laboratory values were ordered for the patient including screening of hyperlipidemia, renal function and hepatic function.   Medication reconciliation,  past medical history, social history, problem list and allergies were reviewed in detail with the patient  Goals were established with regard to weight loss, exercise, and  diet in compliance with medications. He does not exercise or follow a specific diet. He leads a pretty sedentary lifestyle   Wt Readings from Last 3 Encounters:  06/24/21 215 lb (97.5 kg)  09/19/20 222 lb (100.7 kg)  03/19/20 226 lb (102.5 kg)   Review of Systems  Constitutional: Negative.    HENT: Negative.    Eyes: Negative.   Respiratory: Negative.    Cardiovascular: Negative.   Gastrointestinal: Negative.   Endocrine: Negative.   Genitourinary: Negative.   Musculoskeletal:  Positive for arthralgias (bilateral pain).  Skin: Negative.   Allergic/Immunologic: Negative.   Neurological: Negative.   Hematological: Negative.   Psychiatric/Behavioral: Negative.    All other systems reviewed and are negative.  Past Medical History:  Diagnosis Date   ELEVATED PROSTATE SPECIFIC ANTIGEN 07/31/2009   HYPERLIPIDEMIA 06/28/2007   HYPERTENSION 06/28/2007   INSOMNIA 12/07/2007   Non-small cell lung cancer ([place]) 2009   PULMONARY NODULE, RIGHT MIDDLE LOBE 12/07/2007   TOBACCO ABUSE 11/09/2007    Social History   Socioeconomic History   Marital status: Widowed    Spouse name: Not on file   Number of children: Not on file   Years of education: Not on file   Highest education level: Not on file  Occupational History   Not on file  Tobacco Use   Smoking status: Former    Types: Cigarettes    Quit date: 08/18/2007    Years since quitting: 13.8   Smokeless tobacco: Never  Substance and Sexual Activity   Alcohol use: No   Drug use: No   Sexual activity: Not on file  Other Topics Concern   Not on file  Social History Narrative   Retired - Oncologist    Social Determinants of Radio broadcast assistant Strain: Not on file  Food Insecurity: Not on file  Transportation Needs: Not on file  Physical Activity: Not on file  Stress: Not on file  Social Connections: Not on file  Intimate Partner Violence: Not on file    Past Surgical History:  Procedure Laterality Date   APPENDECTOMY     LOBECTOMY     right middle   PROSTATE SURGERY     bx   VEIN LIGATION AND STRIPPING Left 1974    History reviewed. No pertinent family history.  Allergies  Allergen Reactions   Niacin     Current Outpatient Medications on File Prior to Visit  Medication Sig Dispense Refill    atenolol (TENORMIN) 100 MG tablet Take 1 tablet (100 mg total) by mouth daily. 90 tablet 3   lisinopril (ZESTRIL) 40 MG tablet Take 1 tablet (40 mg total) by mouth daily. 90 tablet 3   simvastatin (ZOCOR) 40 MG tablet Take 1 tablet (40 mg total) by mouth at bedtime. 90 tablet 3   No current facility-administered medications on file prior to visit.    BP 120/60   Pulse 62   Temp 98.6 F (37 C) (Oral)   Ht 5' 11.5" (1.816 m)   Wt 215 lb (97.5 kg)   SpO2 96%   BMI 29.57 kg/m        Objective:   Physical Exam Vitals and nursing note reviewed.  Constitutional:      General: He is not in acute distress.    Appearance: Normal appearance. He is well-developed and normal weight.  HENT:     Head: Normocephalic and atraumatic.     Right Ear: Tympanic membrane, ear canal and external ear normal. There is no impacted cerumen.     Left Ear: Tympanic membrane, ear canal and external ear normal. There is no impacted cerumen.     Nose: Nose normal. No congestion or rhinorrhea.     Mouth/Throat:     Mouth: Mucous membranes are moist.     Pharynx: Oropharynx is clear. No oropharyngeal exudate or posterior oropharyngeal erythema.  Eyes:     General:        Right eye: No discharge.        Left eye: No discharge.     Extraocular Movements: Extraocular movements intact.     Conjunctiva/sclera: Conjunctivae normal.     Pupils: Pupils are equal, round, and reactive to light.  Neck:     Vascular: No carotid bruit.     Trachea: No tracheal deviation.  Cardiovascular:     Rate and Rhythm: Normal rate and regular rhythm.     Pulses: Normal pulses.     Heart sounds: Normal heart sounds. No murmur heard.   No friction rub. No gallop.  Pulmonary:     Effort: Pulmonary effort is normal. No respiratory distress.     Breath sounds: Normal breath sounds. No stridor. No wheezing, rhonchi or rales.  Chest:     Chest wall: No tenderness.  Abdominal:     General: Bowel sounds are normal. There is  no distension.     Palpations: Abdomen is soft. There is no mass.     Tenderness: There is no abdominal tenderness. There is no right CVA tenderness, left CVA tenderness, guarding or rebound.     Hernia: No hernia is present.  Musculoskeletal:        General: No swelling, tenderness, deformity or signs of injury. Normal range of motion.     Right lower leg: No edema.     Left lower leg: No edema.  Lymphadenopathy:     Cervical: No cervical adenopathy.  Skin:    General: Skin is warm and dry.  Capillary Refill: Capillary refill takes less than 2 seconds.     Coloration: Skin is not jaundiced or pale.     Findings: No bruising, erythema, lesion or rash.  Neurological:     General: No focal deficit present.     Mental Status: He is alert and oriented to person, place, and time.     Cranial Nerves: No cranial nerve deficit.     Sensory: No sensory deficit.     Motor: No weakness.     Coordination: Coordination normal.     Gait: Gait abnormal (walks with cane).     Deep Tendon Reflexes: Reflexes normal.  Psychiatric:        Mood and Affect: Mood normal.        Behavior: Behavior normal.        Thought Content: Thought content normal.        Judgment: Judgment normal.      Assessment & Plan:  1. Routine general medical examination at a health care facility - Encouraged exercise and heart healthy diet  - CBC with Differential/Platelet; Future - Comprehensive metabolic panel; Future - Lipid panel; Future - TSH; Future  2. Essential hypertension - Well controlled. No change in medication - CBC with Differential/Platelet; Future - Comprehensive metabolic panel; Future - Lipid panel; Future - TSH; Future  3. Dyslipidemia - Consider change of statin  - CBC with Differential/Platelet; Future - Comprehensive metabolic panel; Future - Lipid panel; Future - TSH; Future  4. Need for immunization against influenza  - Flu Vaccine QUAD High Dose(Fluad)  Dorothyann Peng, NP

## 2021-08-12 ENCOUNTER — Telehealth: Payer: Self-pay | Admitting: Adult Health

## 2021-08-12 NOTE — Telephone Encounter (Signed)
Tried calling patient to schedule Medicare Annual Wellness Visit (AWV) either virtually or in office.  No answer  Last AWV 09/27/17 ; please schedule at anytime with [facility] Nurse Health Advisor 1 or 2   This should be a 45 minute visit.

## 2021-09-03 ENCOUNTER — Other Ambulatory Visit (INDEPENDENT_AMBULATORY_CARE_PROVIDER_SITE_OTHER): Payer: Medicare HMO

## 2021-09-03 DIAGNOSIS — I10 Essential (primary) hypertension: Secondary | ICD-10-CM | POA: Diagnosis not present

## 2021-09-03 DIAGNOSIS — E785 Hyperlipidemia, unspecified: Secondary | ICD-10-CM | POA: Diagnosis not present

## 2021-09-03 DIAGNOSIS — Z0000 Encounter for general adult medical examination without abnormal findings: Secondary | ICD-10-CM

## 2021-09-03 LAB — COMPREHENSIVE METABOLIC PANEL
ALT: 12 U/L (ref 0–53)
AST: 17 U/L (ref 0–37)
Albumin: 4.2 g/dL (ref 3.5–5.2)
Alkaline Phosphatase: 45 U/L (ref 39–117)
BUN: 18 mg/dL (ref 6–23)
CO2: 31 mEq/L (ref 19–32)
Calcium: 9.3 mg/dL (ref 8.4–10.5)
Chloride: 102 mEq/L (ref 96–112)
Creatinine, Ser: 0.86 mg/dL (ref 0.40–1.50)
GFR: 78.03 mL/min (ref >60.00)
Glucose, Bld: 112 mg/dL — ABNORMAL HIGH (ref 70–99)
Potassium: 4.5 mEq/L (ref 3.5–5.1)
Sodium: 141 mEq/L (ref 135–145)
Total Bilirubin: 2 mg/dL — ABNORMAL HIGH (ref 0.2–1.2)
Total Protein: 7.4 g/dL (ref 6.0–8.3)

## 2021-09-03 LAB — TSH: TSH: 1.67 u[IU]/mL (ref 0.35–5.50)

## 2021-09-03 LAB — CBC WITH DIFFERENTIAL/PLATELET
Basophils Absolute: 0 10*3/uL (ref 0.0–0.1)
Basophils Relative: 0.7 % (ref 0.0–3.0)
Eosinophils Absolute: 0.1 10*3/uL (ref 0.0–0.7)
Eosinophils Relative: 1.4 % (ref 0.0–5.0)
HCT: 45.4 % (ref 39.0–52.0)
Hemoglobin: 15.2 g/dL (ref 13.0–17.0)
Lymphocytes Relative: 11.1 % — ABNORMAL LOW (ref 12.0–46.0)
Lymphs Abs: 0.7 10*3/uL (ref 0.7–4.0)
MCHC: 33.4 g/dL (ref 30.0–36.0)
MCV: 96.9 fl (ref 78.0–100.0)
Monocytes Absolute: 0.6 10*3/uL (ref 0.1–1.0)
Monocytes Relative: 9 % (ref 3.0–12.0)
Neutro Abs: 5.2 10*3/uL (ref 1.4–7.7)
Neutrophils Relative %: 77.8 % — ABNORMAL HIGH (ref 43.0–77.0)
Platelets: 175 10*3/uL (ref 150.0–400.0)
RBC: 4.69 Mil/uL (ref 4.22–5.81)
RDW: 12.4 % (ref 11.5–15.5)
WBC: 6.7 10*3/uL (ref 4.0–10.5)

## 2021-09-03 LAB — LIPID PANEL
Cholesterol: 96 mg/dL (ref 0–200)
HDL: 41 mg/dL (ref >39.00)
LDL Cholesterol: 39 mg/dL (ref 0–99)
NonHDL: 55.31
Total CHOL/HDL Ratio: 2
Triglycerides: 83 mg/dL (ref 0.0–149.0)
VLDL: 16.6 mg/dL (ref 0.0–40.0)

## 2021-09-04 NOTE — Progress Notes (Signed)
Called pt no answer. Unable to leave a vm as vm has not been set up yet.

## 2021-09-05 ENCOUNTER — Telehealth: Payer: Self-pay | Admitting: Adult Health

## 2021-09-05 NOTE — Telephone Encounter (Signed)
Spoke to patient to schedule Medicare Annual Wellness Visit (AWV) either virtually or in office.   Patient declined stating he see cory 2x year   Last AWV 09/27/17 please schedule at anytime with [facility] Nurse Health Advisor 1 or 2   This should be a 45 minute visit.

## 2021-09-19 ENCOUNTER — Telehealth: Payer: Self-pay | Admitting: Adult Health

## 2021-09-19 ENCOUNTER — Other Ambulatory Visit: Payer: Self-pay

## 2021-09-19 DIAGNOSIS — I10 Essential (primary) hypertension: Secondary | ICD-10-CM

## 2021-09-19 DIAGNOSIS — E785 Hyperlipidemia, unspecified: Secondary | ICD-10-CM

## 2021-09-19 MED ORDER — ATENOLOL 100 MG PO TABS: 100.0000 mg | ORAL_TABLET | Freq: Every day | ORAL | 3 refills | Status: DC

## 2021-09-19 MED ORDER — LISINOPRIL 40 MG PO TABS: 40.0000 mg | ORAL_TABLET | Freq: Every day | ORAL | 3 refills | Status: DC

## 2021-09-19 MED ORDER — SIMVASTATIN 40 MG PO TABS: 40.0000 mg | ORAL_TABLET | Freq: Every day | ORAL | 3 refills | Status: DC

## 2021-09-19 NOTE — Telephone Encounter (Signed)
Patient daughter is calling in requesting a medication refill for simvastatin (ZOCOR) 40 MG tablet [11366], lisinopril (ZESTRIL) 40 MG tablet [[id number]] , and atenolol (TENORMIN) 100 MG tablet [716] to be sent to his pharmacy.  Please advise.

## 2021-09-19 NOTE — Telephone Encounter (Signed)
Rx sent to [facility]. Tried to call pt daughter back but no answer.

## 2021-10-14 DIAGNOSIS — M1611 Unilateral primary osteoarthritis, right hip: Secondary | ICD-10-CM | POA: Diagnosis not present

## 2021-10-14 DIAGNOSIS — M1711 Unilateral primary osteoarthritis, right knee: Secondary | ICD-10-CM | POA: Diagnosis not present

## 2021-10-14 DIAGNOSIS — M25562 Pain in left knee: Secondary | ICD-10-CM | POA: Diagnosis not present

## 2021-10-14 DIAGNOSIS — M5441 Lumbago with sciatica, right side: Secondary | ICD-10-CM | POA: Diagnosis not present

## 2021-10-30 DIAGNOSIS — H2512 Age-related nuclear cataract, left eye: Secondary | ICD-10-CM | POA: Diagnosis not present

## 2021-10-30 DIAGNOSIS — H18511 Endothelial corneal dystrophy, right eye: Secondary | ICD-10-CM | POA: Diagnosis not present

## 2021-10-30 DIAGNOSIS — H2513 Age-related nuclear cataract, bilateral: Secondary | ICD-10-CM | POA: Diagnosis not present

## 2021-10-30 DIAGNOSIS — H2511 Age-related nuclear cataract, right eye: Secondary | ICD-10-CM | POA: Diagnosis not present

## 2021-10-30 HISTORY — PX: CATARACT EXTRACTION: SUR2

## 2021-11-20 DIAGNOSIS — Z01818 Encounter for other preprocedural examination: Secondary | ICD-10-CM | POA: Diagnosis not present

## 2021-11-20 DIAGNOSIS — H2512 Age-related nuclear cataract, left eye: Secondary | ICD-10-CM | POA: Diagnosis not present

## 2021-12-23 ENCOUNTER — Ambulatory Visit: Payer: Medicare HMO | Admitting: Adult Health

## 2022-01-06 ENCOUNTER — Encounter: Payer: Self-pay | Admitting: Adult Health

## 2022-01-06 ENCOUNTER — Ambulatory Visit (INDEPENDENT_AMBULATORY_CARE_PROVIDER_SITE_OTHER): Payer: Medicare HMO | Admitting: Adult Health

## 2022-01-06 VITALS — BP 110/76 | HR 86 | Temp 97.6°F | Ht 71.5 in | Wt 207.0 lb

## 2022-01-06 DIAGNOSIS — I10 Essential (primary) hypertension: Secondary | ICD-10-CM | POA: Diagnosis not present

## 2022-01-06 DIAGNOSIS — E785 Hyperlipidemia, unspecified: Secondary | ICD-10-CM

## 2022-01-06 NOTE — Patient Instructions (Addendum)
  It was great seeing you today   I will see you back after November 8th for your physical exam   Let me know if you need anything in the meantime

## 2022-01-06 NOTE — Progress Notes (Signed)
Subjective:    Patient ID: John Riley, male    DOB: 12-Jul-1932, 88 y.o.   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  He presents to the office today for 31-month follow-up.  Reports that overall he is doing pretty well and has no acute complaints today.  Hypertension-controlled with lisinopril 40 mg daily and atenolol 100 mg daily.  He denies dizziness, lightheadedness, chest pain, shortness of breath.  He does not monitor his blood pressure at home  BP Readings from Last 3 Encounters:  01/06/22 110/76  06/24/21 120/60  09/19/20 132/80   Hyperlipidemia-takes simvastatin 40 mg daily.  Denies myalgia or fatigue Lab Results  Component Value Date   CHOL 96 09/03/2021   HDL 41.00 09/03/2021   LDLCALC 39 09/03/2021   TRIG 83.0 09/03/2021   CHOLHDL 2 09/03/2021    Osteoarthritis of multiple joints- mostly in bilateral knees. He has ad injections in the past and did not find this helpful, he has also tried wraps and lidocaine patches but that did not help   He denies falls or depression.Marland Kitchen  Has not been seen in the ER or had any hospital admissions over the last 6 months  [place] Visit from 10/10/2019 in [place] at [facility]  PHQ-9 Total Score 0       He has been working on reducing sugars and fast foods and has been able to lose weight  Wt Readings from Last 3 Encounters:  01/06/22 207 lb (93.9 kg)  06/24/21 215 lb (97.5 kg)  09/19/20 222 lb (100.7 kg)    He denies any acute complaints    Review of Systems  HENT: Negative.    Eyes: Negative.   Respiratory: Negative.    Cardiovascular: Negative.   Gastrointestinal: Negative.   Genitourinary: Negative.    Musculoskeletal:  Positive for arthralgias and back pain.  Neurological: Negative.   Hematological: Negative.   Psychiatric/Behavioral: Negative.    All other systems reviewed and are negative.  Past Medical History:  Diagnosis Date   ELEVATED PROSTATE SPECIFIC ANTIGEN 07/31/2009   HYPERLIPIDEMIA 06/28/2007   HYPERTENSION 06/28/2007   INSOMNIA 12/07/2007   Non-small cell lung cancer ([place]) 2009   PULMONARY NODULE, RIGHT MIDDLE LOBE 12/07/2007   TOBACCO ABUSE 11/09/2007    Social History   Socioeconomic History   Marital status: Widowed    Spouse name: Not on file   Number of children: Not on file   Years of education: Not on file   Highest education level: Not on file  Occupational History   Not on file  Tobacco Use   Smoking status: Former    Types: Cigarettes    Quit date: 08/18/2007    Years since quitting: 14.3   Smokeless tobacco: Never  Substance and Sexual Activity   Alcohol use: No   Drug use: No   Sexual activity: Not on file  Other Topics Concern   Not on file  Social History Narrative   Retired - Oncologist    Social Determinants of Radio broadcast assistant Strain: Not on file  Food Insecurity: Not on file  Transportation Needs: Not on file  Physical Activity: Not on file  Stress: Not on file  Social Connections: Not on file  Intimate Partner Violence: Not on  file    Past Surgical History:  Procedure Laterality Date   APPENDECTOMY     CATARACT EXTRACTION Left 10/30/2021   LOBECTOMY     right middle   PROSTATE SURGERY     bx   VEIN LIGATION AND STRIPPING Left 1974    History reviewed. No pertinent family history.  Allergies  Allergen Reactions   Niacin     Current Outpatient Medications on File Prior to Visit  Medication Sig Dispense Refill   atenolol (TENORMIN) 100 MG tablet Take 1 tablet (100 mg total) by mouth daily. 90 tablet 3   lisinopril (ZESTRIL) 40 MG tablet Take 1 tablet (40 mg total) by mouth daily. 90 tablet 3    simvastatin (ZOCOR) 40 MG tablet Take 1 tablet (40 mg total) by mouth at bedtime. 90 tablet 3   No current facility-administered medications on file prior to visit.    BP 110/76   Pulse 86   Temp 97.6 F (36.4 C) (Oral)   Ht 5' 11.5" (1.816 m)   Wt 207 lb (93.9 kg)   SpO2 94%   BMI 28.47 kg/m       Objective:   Physical Exam Vitals and nursing note reviewed.  Constitutional:      Appearance: Normal appearance.  Cardiovascular:     Rate and Rhythm: Normal rate and regular rhythm.     Pulses: Normal pulses.     Heart sounds: Normal heart sounds.  Pulmonary:     Effort: Pulmonary effort is normal.     Breath sounds: Normal breath sounds.  Musculoskeletal:        General: Normal range of motion.  Skin:    General: Skin is warm and dry.  Neurological:     General: No focal deficit present.     Mental Status: He is alert and oriented to person, place, and time.  Psychiatric:        Mood and Affect: Mood normal.        Behavior: Behavior normal.        Thought Content: Thought content normal.      Assessment & Plan:  1. Essential hypertension - Well controlled.  - No change in medications   2. Dyslipidemia - Continue statin  - Continue to work on healthy diet   Dorothyann Peng, NP

## 2022-07-14 ENCOUNTER — Ambulatory Visit (INDEPENDENT_AMBULATORY_CARE_PROVIDER_SITE_OTHER): Payer: Medicare HMO | Admitting: Adult Health

## 2022-07-14 ENCOUNTER — Telehealth: Payer: Self-pay | Admitting: Adult Health

## 2022-07-14 ENCOUNTER — Encounter: Payer: Self-pay | Admitting: Adult Health

## 2022-07-14 VITALS — BP 110/62 | HR 55 | Temp 98.0°F | Ht 70.75 in | Wt 203.0 lb

## 2022-07-14 DIAGNOSIS — S161XXA Strain of muscle, fascia and tendon at neck level, initial encounter: Secondary | ICD-10-CM | POA: Diagnosis not present

## 2022-07-14 DIAGNOSIS — R7302 Impaired glucose tolerance (oral): Secondary | ICD-10-CM

## 2022-07-14 DIAGNOSIS — C3490 Malignant neoplasm of unspecified part of unspecified bronchus or lung: Secondary | ICD-10-CM | POA: Diagnosis not present

## 2022-07-14 DIAGNOSIS — M159 Polyosteoarthritis, unspecified: Secondary | ICD-10-CM

## 2022-07-14 DIAGNOSIS — C61 Malignant neoplasm of prostate: Secondary | ICD-10-CM | POA: Diagnosis not present

## 2022-07-14 DIAGNOSIS — Z0000 Encounter for general adult medical examination without abnormal findings: Secondary | ICD-10-CM

## 2022-07-14 DIAGNOSIS — E785 Hyperlipidemia, unspecified: Secondary | ICD-10-CM | POA: Diagnosis not present

## 2022-07-14 DIAGNOSIS — I10 Essential (primary) hypertension: Secondary | ICD-10-CM | POA: Diagnosis not present

## 2022-07-14 LAB — CBC WITH DIFFERENTIAL/PLATELET
Basophils Absolute: 0 10*3/uL (ref 0.0–0.1)
Basophils Relative: 0.4 % (ref 0.0–3.0)
Eosinophils Absolute: 0.1 10*3/uL (ref 0.0–0.7)
Eosinophils Relative: 1.1 % (ref 0.0–5.0)
HCT: 46 % (ref 39.0–52.0)
Hemoglobin: 15.7 g/dL (ref 13.0–17.0)
Lymphocytes Relative: 9.8 % — ABNORMAL LOW (ref 12.0–46.0)
Lymphs Abs: 0.7 10*3/uL (ref 0.7–4.0)
MCHC: 34.1 g/dL (ref 30.0–36.0)
MCV: 97.8 fl (ref 78.0–100.0)
Monocytes Absolute: 0.7 10*3/uL (ref 0.1–1.0)
Monocytes Relative: 9.2 % (ref 3.0–12.0)
Neutro Abs: 5.7 10*3/uL (ref 1.4–7.7)
Neutrophils Relative %: 79.5 % — ABNORMAL HIGH (ref 43.0–77.0)
Platelets: 169 10*3/uL (ref 150.0–400.0)
RBC: 4.7 Mil/uL (ref 4.22–5.81)
RDW: 12.3 % (ref 11.5–15.5)
WBC: 7.2 10*3/uL (ref 4.0–10.5)

## 2022-07-14 LAB — LIPID PANEL
Cholesterol: 102 mg/dL (ref 0–200)
HDL: 41 mg/dL (ref >39.00)
LDL Cholesterol: 36 mg/dL (ref 0–99)
NonHDL: 61.47
Total CHOL/HDL Ratio: 2
Triglycerides: 127 mg/dL (ref 0.0–149.0)
VLDL: 25.4 mg/dL (ref 0.0–40.0)

## 2022-07-14 LAB — COMPREHENSIVE METABOLIC PANEL
ALT: 15 U/L (ref 0–53)
AST: 16 U/L (ref 0–37)
Albumin: 4.3 g/dL (ref 3.5–5.2)
Alkaline Phosphatase: 46 U/L (ref 39–117)
BUN: 16 mg/dL (ref 6–23)
CO2: 32 mEq/L (ref 19–32)
Calcium: 9.4 mg/dL (ref 8.4–10.5)
Chloride: 103 mEq/L (ref 96–112)
Creatinine, Ser: 0.83 mg/dL (ref 0.40–1.50)
GFR: 78.39 mL/min (ref >60.00)
Glucose, Bld: 118 mg/dL — ABNORMAL HIGH (ref 70–99)
Potassium: 4.9 mEq/L (ref 3.5–5.1)
Sodium: 141 mEq/L (ref 135–145)
Total Bilirubin: 1.9 mg/dL — ABNORMAL HIGH (ref 0.2–1.2)
Total Protein: 7.2 g/dL (ref 6.0–8.3)

## 2022-07-14 LAB — TSH: TSH: 1.56 u[IU]/mL (ref 0.35–5.50)

## 2022-07-14 LAB — HEMOGLOBIN A1C: Hgb A1c MFr Bld: 5.8 % (ref 4.6–6.5)

## 2022-07-14 MED ORDER — PREDNISONE 20 MG PO TABS: 20.0000 mg | ORAL_TABLET | Freq: Every day | ORAL | 0 refills | Status: DC

## 2022-07-14 MED ORDER — CYCLOBENZAPRINE HCL 10 MG PO TABS: 10.0000 mg | ORAL_TABLET | Freq: Every day | ORAL | 0 refills | Status: DC

## 2022-07-14 NOTE — Telephone Encounter (Signed)
Updated patient on his labs

## 2022-07-14 NOTE — Progress Notes (Signed)
Subjective:    Patient ID: John Riley, male    DOB: 27-Aug-1931, 88 y.o.   MRN: [id number]  HPI Patient presents for yearly preventative medicine examination. He is a pleasant 88 year old male who  has a past medical history of ELEVATED PROSTATE SPECIFIC ANTIGEN (07/31/2009), HYPERLIPIDEMIA (06/28/2007), HYPERTENSION (06/28/2007), INSOMNIA (12/07/2007), Non-small cell lung cancer ([place]) (2009), PULMONARY NODULE, RIGHT MIDDLE LOBE (12/07/2007), and TOBACCO ABUSE (11/09/2007).  Hypertension-managed with lisinopril 40 mg daily and atenolol 100 mg daily.  He denies lightheadedness, dizziness, chest pain, shortness of breath.  He does not monitor his blood pressure at home BP Readings from Last 3 Encounters:  07/14/22 110/62  01/06/22 110/76  06/24/21 120/60   Hyperlipidemia-managed with simvastatin 40 mg.  Denies myalgia or fatigue Lab Results  Component Value Date   CHOL 96 09/03/2021   HDL 41.00 09/03/2021   LDLCALC 39 09/03/2021   TRIG 83.0 09/03/2021   CHOLHDL 2 09/03/2021   History of prostate cancer-no longer being seen on a routine basis by urology for follow-up  History of lung cancer-2009.  No longer required to follow-up with pulmonary   Impaired fasting glucose - not on medication   Osteoarthritis - mostly in his right hip and bilateral knees. Has been seen by Orthopedics and has had steroid injections in the past which were not helpful.   Neck Pain/headache - this is his biggest complaint today. He reports that for the last 7 weeks he has been experiencing pain that starts that the base of his neck and radiates up to the skull. This is causing a headaches. Believes it may be from the flu and covid vaccination as it started right after he got both of these.   All immunizations and health maintenance protocols were reviewed with the patient and needed orders were placed.  Appropriate screening laboratory values were ordered for the patient including screening of  hyperlipidemia, renal function and hepatic function.  Medication reconciliation,  past medical history, social history, problem list and allergies were reviewed in detail with the patient  Goals were established with regard to weight loss, exercise, and  diet in compliance with medications Wt Readings from Last 3 Encounters:  07/14/22 203 lb (92.1 kg)  01/06/22 207 lb (93.9 kg)  06/24/21 215 lb (97.5 kg)    Review of Systems  Constitutional: Negative.   HENT: Negative.    Eyes: Negative.   Respiratory: Negative.    Cardiovascular: Negative.   Gastrointestinal: Negative.   Endocrine: Negative.   Genitourinary: Negative.   Musculoskeletal:  Positive for arthralgias, neck pain and neck stiffness.  Skin: Negative.   Allergic/Immunologic: Negative.   Neurological: Negative.   Hematological: Negative.   Psychiatric/Behavioral: Negative.    All other systems reviewed and are negative.  Past Medical History:  Diagnosis Date   ELEVATED PROSTATE SPECIFIC ANTIGEN 07/31/2009   HYPERLIPIDEMIA 06/28/2007   HYPERTENSION 06/28/2007   INSOMNIA 12/07/2007   Non-small cell lung cancer ([place]) 2009   PULMONARY NODULE, RIGHT MIDDLE LOBE 12/07/2007   TOBACCO ABUSE 11/09/2007    Social History   Socioeconomic History   Marital status: Widowed    Spouse name: Not on file   Number of children: Not on file   Years of education: Not on file   Highest education level: Not on file  Occupational History   Not on file  Tobacco Use   Smoking status: Former    Types: Cigarettes    Quit date: 08/18/2007    Years since quitting:  14.9   Smokeless tobacco: Never  Substance and Sexual Activity   Alcohol use: No   Drug use: No   Sexual activity: Not on file  Other Topics Concern   Not on file  Social History Narrative   Retired - Oncologist    Social Determinants of Radio broadcast assistant Strain: Not on file  Food Insecurity: Not on file  Transportation Needs: Not on file  Physical  Activity: Not on file  Stress: Not on file  Social Connections: Not on file  Intimate Partner Violence: Not on file    Past Surgical History:  Procedure Laterality Date   APPENDECTOMY     CATARACT EXTRACTION Left 10/30/2021   LOBECTOMY     right middle   [place] Left 1974    History reviewed. No pertinent family history.  Allergies  Allergen Reactions   Niacin     Current Outpatient Medications on File Prior to Visit  Medication Sig Dispense Refill   atenolol (TENORMIN) 100 MG tablet Take 1 tablet (100 mg total) by mouth daily. 90 tablet 3   lisinopril (ZESTRIL) 40 MG tablet Take 1 tablet (40 mg total) by mouth daily. 90 tablet 3   simvastatin (ZOCOR) 40 MG tablet Take 1 tablet (40 mg total) by mouth at bedtime. 90 tablet 3   No current facility-administered medications on file prior to visit.    BP 110/62   Pulse (!) 55   Temp 98 F (36.7 C) (Oral)   Ht 5' 10.75" (1.797 m)   Wt 203 lb (92.1 kg)   SpO2 94%   BMI 28.51 kg/m       Objective:   Physical Exam Vitals and nursing note reviewed.  Constitutional:      General: He is not in acute distress.    Appearance: Normal appearance. He is well-developed and normal weight.  HENT:     Head: Normocephalic and atraumatic.     Right Ear: Tympanic membrane, ear canal and external ear normal. There is no impacted cerumen.     Left Ear: Tympanic membrane, ear canal and external ear normal. There is no impacted cerumen.     Nose: Nose normal. No congestion or rhinorrhea.     Mouth/Throat:     Mouth: Mucous membranes are moist.     Pharynx: Oropharynx is clear. No oropharyngeal exudate or posterior oropharyngeal erythema.  Eyes:     General:        Right eye: No discharge.        Left eye: No discharge.     Extraocular Movements: Extraocular movements intact.     Conjunctiva/sclera: Conjunctivae normal.     Pupils: Pupils are equal, round, and reactive to light.   Neck:     Vascular: No carotid bruit.     Trachea: No tracheal deviation.  Cardiovascular:     Rate and Rhythm: Normal rate and regular rhythm.     Pulses: Normal pulses.     Heart sounds: Normal heart sounds. No murmur heard.    No friction rub. No gallop.  Pulmonary:     Effort: Pulmonary effort is normal. No respiratory distress.     Breath sounds: Normal breath sounds. No stridor. No wheezing, rhonchi or rales.  Chest:     Chest wall: No tenderness.  Abdominal:     General: Bowel sounds are normal. There is no distension.     Palpations: Abdomen is  soft. There is no mass.     Tenderness: There is no abdominal tenderness. There is no right CVA tenderness, left CVA tenderness, guarding or rebound.     Hernia: No hernia is present.  Musculoskeletal:        General: No swelling, deformity or signs of injury.     Right shoulder: Tenderness present. No bony tenderness. Normal range of motion. Normal strength.     Left shoulder: Tenderness present. No bony tenderness. Normal range of motion. Normal strength.     Cervical back: Torticollis and tenderness present. No bony tenderness. Pain with movement present. Decreased range of motion.     Right lower leg: No edema.     Left lower leg: No edema.  Lymphadenopathy:     Cervical: No cervical adenopathy.  Skin:    General: Skin is warm and dry.     Capillary Refill: Capillary refill takes less than 2 seconds.     Coloration: Skin is not jaundiced or pale.     Findings: No bruising, erythema, lesion or rash.  Neurological:     General: No focal deficit present.     Mental Status: He is alert and oriented to person, place, and time.     Cranial Nerves: No cranial nerve deficit.     Sensory: No sensory deficit.     Motor: No weakness.     Coordination: Coordination normal.     Gait: Gait abnormal.     Deep Tendon Reflexes: Reflexes normal.  Psychiatric:        Mood and Affect: Mood normal.        Behavior: Behavior normal.         Thought Content: Thought content normal.        Judgment: Judgment normal.       Assessment & Plan:  1. Routine general medical examination at a health care facility - Work on weight loss through diet and exercise - Follow up in one year or sooner if needed - CBC with Differential/Platelet; Future - Comprehensive metabolic panel; Future - Hemoglobin A1c; Future - Lipid panel; Future - TSH; Future - TSH - Lipid panel - Hemoglobin A1c - Comprehensive metabolic panel - CBC with Differential/Platelet  2. Essential hypertension - Well controlled. No change in medications  - CBC with Differential/Platelet; Future - Comprehensive metabolic panel; Future - Hemoglobin A1c; Future - Lipid panel; Future - TSH; Future - TSH - Lipid panel - Hemoglobin A1c - Comprehensive metabolic panel - CBC with Differential/Platelet  3. Dyslipidemia - Consider increase in statin  - CBC with Differential/Platelet; Future - Comprehensive metabolic panel; Future - Hemoglobin A1c; Future - Lipid panel; Future - TSH; Future - TSH - Lipid panel - Hemoglobin A1c - Comprehensive metabolic panel - CBC with Differential/Platelet  4. Non-small cell lung cancer, unspecified laterality ([place]) - asymptomatic   5. Prostate cancer ([place]) - asymptomatic   6. Impaired glucose tolerance - Consider metformin  - CBC with Differential/Platelet; Future - Comprehensive metabolic panel; Future - Hemoglobin A1c; Future - Lipid panel; Future - TSH; Future - TSH - Lipid panel - Hemoglobin A1c - Comprehensive metabolic panel - CBC with Differential/Platelet  7. Primary osteoarthritis involving multiple joints - Per orthopedics  8. Strain of neck muscle, initial encounter  - cyclobenzaprine (FLEXERIL) 10 MG tablet; Take 1 tablet (10 mg total) by mouth at bedtime.  Dispense: 15 tablet; Refill: 0 - predniSONE (DELTASONE) 20 MG tablet; Take 1 tablet (20 mg total) by mouth daily with breakfast.  Dispense: 7  tablet; Refill: 0  Dorothyann Peng, NP

## 2022-07-14 NOTE — Patient Instructions (Signed)
It was great seeing you today and Happy Birthday!   We will follow up with you regarding your lab work   I think your headache is coming from tight muscles in your back and shoulders. I have sent in a muscle relaxer called Flexeril - take this at night time. I have also sent in a steroid called prednisone - take this in the morning.   Let me know if this does not help

## 2022-07-16 ENCOUNTER — Other Ambulatory Visit: Payer: Self-pay

## 2022-07-16 ENCOUNTER — Emergency Department (HOSPITAL_COMMUNITY): Payer: Medicare HMO

## 2022-07-16 ENCOUNTER — Inpatient Hospital Stay (HOSPITAL_COMMUNITY)
Admission: EM | Admit: 2022-07-16 | Discharge: 2022-07-21 | DRG: 480 | Disposition: A | Discharge status: Skilled Nursing Facility | Payer: Medicare HMO | Attending: Internal Medicine | Admitting: Internal Medicine

## 2022-07-16 DIAGNOSIS — I959 Hypotension, unspecified: Secondary | ICD-10-CM | POA: Diagnosis not present

## 2022-07-16 DIAGNOSIS — D72829 Elevated white blood cell count, unspecified: Secondary | ICD-10-CM | POA: Diagnosis not present

## 2022-07-16 DIAGNOSIS — S72002A Fracture of unspecified part of neck of left femur, initial encounter for closed fracture: Secondary | ICD-10-CM | POA: Diagnosis not present

## 2022-07-16 DIAGNOSIS — M542 Cervicalgia: Secondary | ICD-10-CM | POA: Diagnosis not present

## 2022-07-16 DIAGNOSIS — J9601 Acute respiratory failure with hypoxia: Secondary | ICD-10-CM | POA: Diagnosis not present

## 2022-07-16 DIAGNOSIS — Z043 Encounter for examination and observation following other accident: Secondary | ICD-10-CM | POA: Diagnosis not present

## 2022-07-16 DIAGNOSIS — T380X5A Adverse effect of glucocorticoids and synthetic analogues, initial encounter: Secondary | ICD-10-CM | POA: Diagnosis not present

## 2022-07-16 DIAGNOSIS — S72142A Displaced intertrochanteric fracture of left femur, initial encounter for closed fracture: Principal | ICD-10-CM | POA: Diagnosis present

## 2022-07-16 DIAGNOSIS — Z902 Acquired absence of lung [part of]: Secondary | ICD-10-CM | POA: Diagnosis not present

## 2022-07-16 DIAGNOSIS — S72002D Fracture of unspecified part of neck of left femur, subsequent encounter for closed fracture with routine healing: Secondary | ICD-10-CM | POA: Diagnosis not present

## 2022-07-16 DIAGNOSIS — N400 Enlarged prostate without lower urinary tract symptoms: Secondary | ICD-10-CM | POA: Diagnosis not present

## 2022-07-16 DIAGNOSIS — W19XXXA Unspecified fall, initial encounter: Secondary | ICD-10-CM | POA: Diagnosis not present

## 2022-07-16 DIAGNOSIS — Z8546 Personal history of malignant neoplasm of prostate: Secondary | ICD-10-CM

## 2022-07-16 DIAGNOSIS — R102 Pelvic and perineal pain: Secondary | ICD-10-CM | POA: Diagnosis not present

## 2022-07-16 DIAGNOSIS — S7292XA Unspecified fracture of left femur, initial encounter for closed fracture: Secondary | ICD-10-CM | POA: Diagnosis present

## 2022-07-16 DIAGNOSIS — R609 Edema, unspecified: Secondary | ICD-10-CM | POA: Diagnosis not present

## 2022-07-16 DIAGNOSIS — Z85118 Personal history of other malignant neoplasm of bronchus and lung: Secondary | ICD-10-CM | POA: Diagnosis not present

## 2022-07-16 DIAGNOSIS — W1830XA Fall on same level, unspecified, initial encounter: Secondary | ICD-10-CM | POA: Diagnosis present

## 2022-07-16 DIAGNOSIS — Z1152 Encounter for screening for COVID-19: Secondary | ICD-10-CM

## 2022-07-16 DIAGNOSIS — G8911 Acute pain due to trauma: Secondary | ICD-10-CM | POA: Diagnosis not present

## 2022-07-16 DIAGNOSIS — I10 Essential (primary) hypertension: Secondary | ICD-10-CM | POA: Diagnosis not present

## 2022-07-16 DIAGNOSIS — E785 Hyperlipidemia, unspecified: Secondary | ICD-10-CM | POA: Diagnosis present

## 2022-07-16 DIAGNOSIS — Z87891 Personal history of nicotine dependence: Secondary | ICD-10-CM | POA: Diagnosis not present

## 2022-07-16 DIAGNOSIS — R0902 Hypoxemia: Secondary | ICD-10-CM | POA: Diagnosis not present

## 2022-07-16 DIAGNOSIS — S72145D Nondisplaced intertrochanteric fracture of left femur, subsequent encounter for closed fracture with routine healing: Secondary | ICD-10-CM | POA: Diagnosis not present

## 2022-07-16 DIAGNOSIS — S72145A Nondisplaced intertrochanteric fracture of left femur, initial encounter for closed fracture: Secondary | ICD-10-CM | POA: Diagnosis not present

## 2022-07-16 DIAGNOSIS — M25552 Pain in left hip: Secondary | ICD-10-CM | POA: Diagnosis not present

## 2022-07-16 DIAGNOSIS — Z7401 Bed confinement status: Secondary | ICD-10-CM | POA: Diagnosis not present

## 2022-07-16 DIAGNOSIS — Y92239 Unspecified place in hospital as the place of occurrence of the external cause: Secondary | ICD-10-CM | POA: Diagnosis not present

## 2022-07-16 DIAGNOSIS — R531 Weakness: Secondary | ICD-10-CM | POA: Diagnosis not present

## 2022-07-16 DIAGNOSIS — T68XXXA Hypothermia, initial encounter: Secondary | ICD-10-CM | POA: Diagnosis not present

## 2022-07-16 DIAGNOSIS — R7989 Other specified abnormal findings of blood chemistry: Secondary | ICD-10-CM | POA: Diagnosis present

## 2022-07-16 LAB — CBC WITH DIFFERENTIAL/PLATELET
Abs Immature Granulocytes: 0.15 10*3/uL — ABNORMAL HIGH (ref 0.00–0.07)
Basophils Absolute: 0 10*3/uL (ref 0.0–0.1)
Basophils Relative: 0 %
Eosinophils Absolute: 0 10*3/uL (ref 0.0–0.5)
Eosinophils Relative: 0 %
HCT: 41.8 % (ref 39.0–52.0)
Hemoglobin: 13.5 g/dL (ref 13.0–17.0)
Immature Granulocytes: 1 %
Lymphocytes Relative: 3 %
Lymphs Abs: 0.5 10*3/uL — ABNORMAL LOW (ref 0.7–4.0)
MCH: 32.5 pg (ref 26.0–34.0)
MCHC: 32.3 g/dL (ref 30.0–36.0)
MCV: 100.7 fL — ABNORMAL HIGH (ref 80.0–100.0)
Monocytes Absolute: 1 10*3/uL (ref 0.1–1.0)
Monocytes Relative: 5 %
Neutro Abs: 16.4 10*3/uL — ABNORMAL HIGH (ref 1.7–7.7)
Neutrophils Relative %: 91 %
Platelets: 147 10*3/uL — ABNORMAL LOW (ref 150–400)
RBC: 4.15 MIL/uL — ABNORMAL LOW (ref 4.22–5.81)
RDW: 11.6 % (ref 11.5–15.5)
WBC: 18.1 10*3/uL — ABNORMAL HIGH (ref 4.0–10.5)
nRBC: 0 % (ref 0.0–0.2)

## 2022-07-16 LAB — BLOOD GAS, VENOUS
Acid-Base Excess: 5.7 mmol/L — ABNORMAL HIGH (ref 0.0–2.0)
Bicarbonate: 32.1 mmol/L — ABNORMAL HIGH (ref 20.0–28.0)
O2 Saturation: 51.1 %
Patient temperature: 37
pCO2, Ven: 53 mmHg (ref 44–60)
pH, Ven: 7.39 (ref 7.25–7.43)
pO2, Ven: 34 mmHg (ref 32–45)

## 2022-07-16 LAB — PROTIME-INR
INR: 1.1 (ref 0.8–1.2)
Prothrombin Time: 14.1 seconds (ref 11.4–15.2)

## 2022-07-16 LAB — COMPREHENSIVE METABOLIC PANEL
ALT: 18 U/L (ref 0–44)
AST: 21 U/L (ref 15–41)
Albumin: 3.6 g/dL (ref 3.5–5.0)
Alkaline Phosphatase: 39 U/L (ref 38–126)
Anion gap: 7 (ref 5–15)
BUN: 20 mg/dL (ref 8–23)
CO2: 28 mmol/L (ref 22–32)
Calcium: 8.7 mg/dL — ABNORMAL LOW (ref 8.9–10.3)
Chloride: 106 mmol/L (ref 98–111)
Creatinine, Ser: 0.75 mg/dL (ref 0.61–1.24)
GFR, Estimated: >60 mL/min (ref >60)
Glucose, Bld: 118 mg/dL — ABNORMAL HIGH (ref 70–99)
Potassium: 4.1 mmol/L (ref 3.5–5.1)
Sodium: 141 mmol/L (ref 135–145)
Total Bilirubin: 2 mg/dL — ABNORMAL HIGH (ref 0.3–1.2)
Total Protein: 6.5 g/dL (ref 6.5–8.1)

## 2022-07-16 LAB — SARS CORONAVIRUS 2 BY RT PCR: SARS Coronavirus 2 by RT PCR: NEGATIVE

## 2022-07-16 LAB — TYPE AND SCREEN
ABO/RH(D): A POS
Antibody Screen: NEGATIVE

## 2022-07-16 MED ORDER — CYCLOBENZAPRINE HCL 10 MG PO TABS
10.0000 mg | ORAL_TABLET | Freq: Every day | ORAL | Status: DC
  Administered 2022-07-17 – 2022-07-20 (×5): 10 mg via ORAL
  Filled 2022-07-16 (×5): qty 1

## 2022-07-16 MED ORDER — HYDROMORPHONE HCL 1 MG/ML IJ SOLN
1.0000 mg | Freq: Once | INTRAMUSCULAR | Status: AC
  Administered 2022-07-16: 1 mg via INTRAVENOUS
  Filled 2022-07-16: qty 1

## 2022-07-16 MED ORDER — MORPHINE SULFATE (PF) 4 MG/ML IV SOLN
4.0000 mg | Freq: Once | INTRAVENOUS | Status: AC
  Administered 2022-07-16: 4 mg via INTRAVENOUS
  Filled 2022-07-16: qty 1

## 2022-07-16 MED ORDER — LISINOPRIL 10 MG PO TABS: 40.0000 mg | ORAL_TABLET | Freq: Every day | ORAL | Status: DC

## 2022-07-16 MED ORDER — HYDROCODONE-ACETAMINOPHEN 5-325 MG PO TABS
1.0000 | ORAL_TABLET | Freq: Four times a day (QID) | ORAL | Status: DC | PRN
  Administered 2022-07-17: 2 via ORAL
  Filled 2022-07-16: qty 2

## 2022-07-16 MED ORDER — ATENOLOL 50 MG PO TABS
100.0000 mg | ORAL_TABLET | Freq: Every day | ORAL | Status: DC
  Administered 2022-07-19 – 2022-07-21 (×3): 100 mg via ORAL
  Filled 2022-07-16 (×5): qty 2

## 2022-07-16 MED ORDER — ONDANSETRON HCL 4 MG/2ML IJ SOLN
4.0000 mg | Freq: Once | INTRAMUSCULAR | Status: AC
  Administered 2022-07-16: 4 mg via INTRAVENOUS
  Filled 2022-07-16: qty 2

## 2022-07-16 MED ORDER — MORPHINE SULFATE (PF) 2 MG/ML IV SOLN: 0.5000 mg | INTRAVENOUS | Status: DC | PRN

## 2022-07-16 MED ORDER — SENNA 8.6 MG PO TABS
1.0000 | ORAL_TABLET | Freq: Two times a day (BID) | ORAL | Status: DC
  Administered 2022-07-16 – 2022-07-21 (×8): 8.6 mg via ORAL
  Filled 2022-07-16 (×10): qty 1

## 2022-07-16 MED ORDER — SIMVASTATIN 20 MG PO TABS
40.0000 mg | ORAL_TABLET | Freq: Every day | ORAL | Status: DC
  Administered 2022-07-17 – 2022-07-20 (×5): 40 mg via ORAL
  Filled 2022-07-16 (×5): qty 2

## 2022-07-16 NOTE — Assessment & Plan Note (Signed)
-  reports neck pain for the past month. Started on prednisone and Flexeril earlier this week with improvement. -will continue Flexeril but will discontinue steroid -if pain persist, recommend imaging with X-ray

## 2022-07-16 NOTE — ED Provider Notes (Signed)
[facility] DEPT Provider Note   CSN: [id number] Arrival date & time: 07/16/22  1854     History  Chief Complaint  Patient presents with   Rada Hay Langan is a 88 y.o. male history of hypertension here presenting with a fall.  Patient states that he was at the sink and his left knee gave out and he landed directly on the left hip.  Patient states that he was unable to move his left hip afterwards.  Patient has no head injury or loss of consciousness.  Patient was initially noted to be hypoxic to 85% but adamantly denies hitting his chest and is not on oxygen at baseline.  Patient denies any chest pain or shortness of breath.  The history is provided by the patient.       Home Medications Prior to Admission medications   Medication Sig Start Date End Date Taking? Authorizing Provider  atenolol (TENORMIN) 100 MG tablet Take 1 tablet (100 mg total) by mouth daily. 09/19/21   Nafziger, Tommi Rumps, NP  cyclobenzaprine (FLEXERIL) 10 MG tablet Take 1 tablet (10 mg total) by mouth at bedtime. 07/14/22   Nafziger, Tommi Rumps, NP  lisinopril (ZESTRIL) 40 MG tablet Take 1 tablet (40 mg total) by mouth daily. 09/19/21   Nafziger, Tommi Rumps, NP  predniSONE (DELTASONE) 20 MG tablet Take 1 tablet (20 mg total) by mouth daily with breakfast. 07/14/22   Nafziger, Tommi Rumps, NP  simvastatin (ZOCOR) 40 MG tablet Take 1 tablet (40 mg total) by mouth at bedtime. 09/19/21   Dorothyann Peng, NP      Allergies    Niacin    Review of Systems   Review of Systems  Musculoskeletal:        Left hip pain  All other systems reviewed and are negative.   Physical Exam Updated Vital Signs BP 110/60   Pulse (!) 55   Temp 98.5 F (36.9 C) (Oral)   Resp 19   SpO2 94%  Physical Exam Vitals and nursing note reviewed.  Constitutional:      Comments: Uncomfortable  HENT:     Head: Normocephalic and atraumatic.     Comments: No signs of head trauma    Nose: Nose normal.     Mouth/Throat:      Mouth: Mucous membranes are moist.  Eyes:     Extraocular Movements: Extraocular movements intact.     Pupils: Pupils are equal, round, and reactive to light.  Cardiovascular:     Rate and Rhythm: Normal rate and regular rhythm.     Pulses: Normal pulses.     Heart sounds: Normal heart sounds.  Pulmonary:     Effort: Pulmonary effort is normal.     Breath sounds: Normal breath sounds.  Abdominal:     General: Abdomen is flat.     Palpations: Abdomen is soft.  Musculoskeletal:     Cervical back: Normal range of motion and neck supple.     Comments: Tenderness of the left hip.  Patient is unable to range the left hip.  Also some tenderness in the left knee as well.  Patient has no signs of tib-fib trauma or ankle trauma.  Has 2+ DP pulse on the left side.  Skin:    General: Skin is warm.     Capillary Refill: Capillary refill takes less than 2 seconds.  Neurological:     General: No focal deficit present.     Mental Status: He is oriented to person,  place, and time.  Psychiatric:        Mood and Affect: Mood normal.        Behavior: Behavior normal.     ED Results / Procedures / Treatments   Labs (all labs ordered are listed, but only abnormal results are displayed) Labs Reviewed  SARS CORONAVIRUS 2 BY RT PCR  CBC WITH DIFFERENTIAL/PLATELET  COMPREHENSIVE METABOLIC PANEL  PROTIME-INR  BLOOD GAS, VENOUS  TYPE AND SCREEN    EKG EKG Interpretation  Date/Time:  Thursday July 16 2022 19:52:33 EST Ventricular Rate:  54 PR Interval:  56 QRS Duration: 105 QT Interval:  432 QTC Calculation: 410 R Axis:   -40 Text Interpretation: Sinus rhythm Short PR interval Left axis deviation Abnormal R-wave progression, early transition No significant change since last tracing Confirmed by Wandra Arthurs [phone]) on 07/16/2022 8:00:38 PM  Radiology DG Chest 1 View  Result Date: 07/16/2022 CLINICAL DATA:  Fall from standing EXAM: CHEST  1 VIEW COMPARISON:  Radiographs 05/31/2012  FINDINGS: No displaced rib fractures. No pneumothorax or pleural effusion. Elevated right hemidiaphragm. Bibasilar atelectasis/consolidation. Biapical scarring. Postoperative changes right lower lung. Widening of the superior mediastinum is likely due to patient leftward rotation. IMPRESSION: No acute abnormality. Electronically Signed   By: Placido Sou M.D.   On: 07/16/2022 20:03   DG Pelvis 1-2 Views  Result Date: 07/16/2022 CLINICAL DATA:  Left hip pain after fall. EXAM: PELVIS - 1-2 VIEW; LEFT FEMUR 2 VIEWS COMPARISON:  None Available. FINDINGS: Acute left intertrochanteric femur fracture. No dislocation. Degenerative changes of the hips and left knee. Soft tissues are unremarkable. IMPRESSION: 1. Acute left intertrochanteric femur fracture. Electronically Signed   By: Titus Dubin M.D.   On: 07/16/2022 20:02   DG Femur Min 2 Views Left  Result Date: 07/16/2022 CLINICAL DATA:  Left hip pain after fall. EXAM: PELVIS - 1-2 VIEW; LEFT FEMUR 2 VIEWS COMPARISON:  None Available. FINDINGS: Acute left intertrochanteric femur fracture. No dislocation. Degenerative changes of the hips and left knee. Soft tissues are unremarkable. IMPRESSION: 1. Acute left intertrochanteric femur fracture. Electronically Signed   By: Titus Dubin M.D.   On: 07/16/2022 20:02    Procedures Procedures    Medications Ordered in ED Medications  morphine (PF) 4 MG/ML injection 4 mg (has no administration in time range)  ondansetron (ZOFRAN) injection 4 mg (has no administration in time range)    ED Course/ Medical Decision Making/ A&P                           Medical Decision Making DAVYD PODGORSKI is a 88 y.o. male presenting with left hip pain after fall.  Concern for possible hip fracture.  Patient also has left knee pain as well.  Will get chest x-ray and pelvis x-ray and femur x-ray that includes the knee.  Will also get preop labs.  8:41 PM X-ray showed intertrochanteric fracture.  I discussed case  with Dr. Marlou Sa from Ortho.  He wants to transfer the patient under the hospitalist service and do hip surgery at [facility] tomorrow.  Patient is not on blood thinner.  He recommend n.p.o. after midnight.  10:29 PM Labs showed WBC 18. Chemistry unremarkable. Hospitalist to admit for hip fracture   Amount and/or Complexity of Data Reviewed Labs: ordered. Radiology: ordered. ECG/medicine tests: ordered.  Risk Prescription drug management.    Final Clinical Impression(s) / ED Diagnoses Final diagnoses:  None    Rx /  DC Orders ED Discharge Orders     None         Drenda Freeze, MD 07/16/22 2229

## 2022-07-16 NOTE — Assessment & Plan Note (Signed)
BP is controlled.  Continue home lisinopril and atenolol

## 2022-07-16 NOTE — ED Triage Notes (Signed)
Pt reports while standing in his kitchen his left leg "gave out" and he fell to the ground. Pt reports left hip pain. Palpable pulses distal to the injury. Pt noted to be 85% on RA. Does not wear oxygen.

## 2022-07-16 NOTE — H&P (Addendum)
History and Physical    Patient: John Riley DOB: 1932-06-25 DOA: 07/16/2022 DOS: the patient was seen and examined on 07/16/2022 PCP: Dorothyann Peng, NP  Patient coming from: Home  Chief Complaint:  Chief Complaint  Patient presents with   Fall   HPI: John Riley is a 88 y.o. male with medical history significant of Non-small cell lung cancer, prostate cancer, hypertension hyperlipidemia who presents following a fall.    Patient was standing over his sink today when his left knee suddenly gave out landing on his left hip.  He was unable to get up and had to crawl to his phone to call his daughter for help since he lives alone.  He reports chronic knee arthritic problems.  He also was started on steroid and Flexeril earlier this week for neck pain but denies any radiculopathy symptoms.  Does not have frequent falls.  Normally uses a cane for ambulation at baseline.  In the ED, he was otherwise afebrile and normotensive on room air.  WBC of 18.1, hemoglobin of 13.5, platelet of 147.  No significant electrolyte maladies.  Glucose of 118.    Left femur x-ray shows acute left intertrochanteric femur fracture.  ED physician discussed with Dr. Marlou Riley with Ortho who wants patient to be transfer to [facility] under hospitalist service.  He will operate tomorrow.  Recommends n.p.o. after midnight.  Review of Systems: As mentioned in the history of present illness. All other systems reviewed and are negative. Past Medical History:  Diagnosis Date   ELEVATED PROSTATE SPECIFIC ANTIGEN 07/31/2009   HYPERLIPIDEMIA 06/28/2007   HYPERTENSION 06/28/2007   INSOMNIA 12/07/2007   Non-small cell lung cancer ([place]) 2009   PULMONARY NODULE, RIGHT MIDDLE LOBE 12/07/2007   TOBACCO ABUSE 11/09/2007   Past Surgical History:  Procedure Laterality Date   APPENDECTOMY     CATARACT EXTRACTION Left 10/30/2021   LOBECTOMY     right middle   PROSTATE SURGERY     bx   VEIN LIGATION AND  STRIPPING Left 1974   Social History:  reports that he quit smoking about 14 years ago. His smoking use included cigarettes. He has never used smokeless tobacco. He reports that he does not drink alcohol and does not use drugs.  Allergies  Allergen Reactions   Niacin     No pertinent family history.  Prior to Admission medications   Medication Sig Start Date End Date Taking? Authorizing Provider  atenolol (TENORMIN) 100 MG tablet Take 1 tablet (100 mg total) by mouth daily. 09/19/21   Nafziger, Tommi Rumps, NP  cyclobenzaprine (FLEXERIL) 10 MG tablet Take 1 tablet (10 mg total) by mouth at bedtime. 07/14/22   Nafziger, Tommi Rumps, NP  lisinopril (ZESTRIL) 40 MG tablet Take 1 tablet (40 mg total) by mouth daily. 09/19/21   Nafziger, Tommi Rumps, NP  predniSONE (DELTASONE) 20 MG tablet Take 1 tablet (20 mg total) by mouth daily with breakfast. 07/14/22   Nafziger, Tommi Rumps, NP  simvastatin (ZOCOR) 40 MG tablet Take 1 tablet (40 mg total) by mouth at bedtime. 09/19/21   Dorothyann Peng, NP    Physical Exam: Vitals:   07/16/22 2130 07/16/22 2200 07/16/22 2300 07/16/22 2336  BP: 90/76 (!) 100/58 (!) 97/59 93/77  Pulse: (!) 58 (!) 58 (!) 57 (!) 56  Resp: (!) 22 19 16  (!) 22  Temp:    97.7 F (36.5 C)  TempSrc:    Oral  SpO2: 99% 95% 95% 96%   Constitutional: NAD, calm, comfortable, elderly gentleman  laying flat in bed Eyes:  lids and conjunctivae normal ENMT: Mucous membranes are moist.  Neck: normal, supple Respiratory: clear to auscultation bilaterally, no wheezing, no crackles. Normal respiratory effort. No accessory muscle use.  Cardiovascular: Regular rate and rhythm, no murmurs / rubs / gallops. No extremity edema. 2+ pedal pulses.  Abdomen: no tenderness,  Bowel sounds positive.  Musculoskeletal: no clubbing / cyanosis. No joint deformity upper and lower extremities. no contractures. Normal muscle tone.  Skin: no rashes, lesions, ulcers. No induration Neurologic: CN 2-12 grossly intact. Has stuttering  speech which has been present since childhood. Sensation intact,Strength 5/5 in all extremities.  Unable to lift left lower extremity due to intolerance to pain. Psychiatric: Normal judgment and insight. Alert and oriented x 3. Normal mood. Data Reviewed:  See HPI  Assessment and Plan: * Femur fracture, left ([place]) - Acute left intertrochanteric femur fracture following mechanical fall -Orthopedic surgeon Dr. Marlou Riley is aware and will take to the OR in the morning.  Keep n.p.o. past midnight.  Patient is not on any anticoagulation or antiplatelet. -PRN hydrocodone and IV morphine for pain -Scheduled senna bowel regimen  Cervical pain -reports neck pain for the past month. Started on prednisone and Flexeril earlier this week with improvement. -will continue Flexeril but will discontinue steroid -if pain persist, recommend imaging with X-ray  Essential hypertension BP is controlled.  Continue home lisinopril and atenolol  Dyslipidemia - Continue statin      Advance Care Planning:   Code Status: Full Code   Consults: Orthopedic surgery  Family Communication: None at bedside  Severity of Illness: The appropriate patient status for this patient is INPATIENT. Inpatient status is judged to be reasonable and necessary in order to provide the required intensity of service to ensure the patient's safety. The patient's presenting symptoms, physical exam findings, and initial radiographic and laboratory data in the context of their chronic comorbidities is felt to place them at high risk for further clinical deterioration. Furthermore, it is not anticipated that the patient will be medically stable for discharge from the hospital within 2 midnights of admission.   * I certify that at the point of admission it is my clinical judgment that the patient will require inpatient hospital care spanning beyond 2 midnights from the point of admission due to high intensity of service, high risk for further  deterioration and high frequency of surveillance required.*  Author: Orene Desanctis, DO 07/16/2022 11:58 PM  For on call review [email or web address].

## 2022-07-16 NOTE — Assessment & Plan Note (Signed)
-   Acute left intertrochanteric femur fracture following mechanical fall -Orthopedic surgeon Dr. Marlou Sa is aware and will take to the OR in the morning.  Keep n.p.o. past midnight.  Patient is not on any anticoagulation or antiplatelet. -PRN hydrocodone and IV morphine for pain -Scheduled senna bowel regimen

## 2022-07-16 NOTE — Assessment & Plan Note (Signed)
Continue statin. 

## 2022-07-17 ENCOUNTER — Inpatient Hospital Stay (HOSPITAL_COMMUNITY): Payer: Medicare HMO | Admitting: Anesthesiology

## 2022-07-17 ENCOUNTER — Inpatient Hospital Stay (HOSPITAL_COMMUNITY): Payer: Medicare HMO

## 2022-07-17 ENCOUNTER — Other Ambulatory Visit: Payer: Self-pay

## 2022-07-17 ENCOUNTER — Encounter (HOSPITAL_COMMUNITY)
Admission: EM | Disposition: A | Discharge status: Skilled Nursing Facility | Payer: Self-pay | Source: Home / Self Care | Attending: Internal Medicine

## 2022-07-17 ENCOUNTER — Encounter (HOSPITAL_COMMUNITY): Payer: Self-pay | Admitting: Family Medicine

## 2022-07-17 DIAGNOSIS — I10 Essential (primary) hypertension: Secondary | ICD-10-CM

## 2022-07-17 DIAGNOSIS — S72142A Displaced intertrochanteric fracture of left femur, initial encounter for closed fracture: Secondary | ICD-10-CM

## 2022-07-17 DIAGNOSIS — Z87891 Personal history of nicotine dependence: Secondary | ICD-10-CM

## 2022-07-17 DIAGNOSIS — S72002A Fracture of unspecified part of neck of left femur, initial encounter for closed fracture: Secondary | ICD-10-CM | POA: Diagnosis not present

## 2022-07-17 HISTORY — PX: INTRAMEDULLARY (IM) NAIL INTERTROCHANTERIC: SHX5875

## 2022-07-17 LAB — CBC WITH DIFFERENTIAL/PLATELET
Abs Immature Granulocytes: 0.04 10*3/uL (ref 0.00–0.07)
Basophils Absolute: 0 10*3/uL (ref 0.0–0.1)
Basophils Relative: 0 %
Eosinophils Absolute: 0 10*3/uL (ref 0.0–0.5)
Eosinophils Relative: 0 %
HCT: 41.8 % (ref 39.0–52.0)
Hemoglobin: 13.4 g/dL (ref 13.0–17.0)
Immature Granulocytes: 0 %
Lymphocytes Relative: 4 %
Lymphs Abs: 0.4 10*3/uL — ABNORMAL LOW (ref 0.7–4.0)
MCH: 32.4 pg (ref 26.0–34.0)
MCHC: 32.1 g/dL (ref 30.0–36.0)
MCV: 101 fL — ABNORMAL HIGH (ref 80.0–100.0)
Monocytes Absolute: 0.6 10*3/uL (ref 0.1–1.0)
Monocytes Relative: 6 %
Neutro Abs: 9.7 10*3/uL — ABNORMAL HIGH (ref 1.7–7.7)
Neutrophils Relative %: 90 %
Platelets: 128 10*3/uL — ABNORMAL LOW (ref 150–400)
RBC: 4.14 MIL/uL — ABNORMAL LOW (ref 4.22–5.81)
RDW: 11.7 % (ref 11.5–15.5)
WBC: 10.8 10*3/uL — ABNORMAL HIGH (ref 4.0–10.5)
nRBC: 0 % (ref 0.0–0.2)

## 2022-07-17 LAB — BASIC METABOLIC PANEL
Anion gap: 3 — ABNORMAL LOW (ref 5–15)
BUN: 15 mg/dL (ref 8–23)
CO2: 26 mmol/L (ref 22–32)
Calcium: 6.4 mg/dL — CL (ref 8.9–10.3)
Chloride: 112 mmol/L — ABNORMAL HIGH (ref 98–111)
Creatinine, Ser: 0.62 mg/dL (ref 0.61–1.24)
GFR, Estimated: >60 mL/min (ref >60)
Glucose, Bld: 91 mg/dL (ref 70–99)
Potassium: 2.9 mmol/L — ABNORMAL LOW (ref 3.5–5.1)
Sodium: 141 mmol/L (ref 135–145)

## 2022-07-17 LAB — COMPREHENSIVE METABOLIC PANEL
ALT: 17 U/L (ref 0–44)
AST: 21 U/L (ref 15–41)
Albumin: 3.3 g/dL — ABNORMAL LOW (ref 3.5–5.0)
Alkaline Phosphatase: 36 U/L — ABNORMAL LOW (ref 38–126)
Anion gap: 7 (ref 5–15)
BUN: 19 mg/dL (ref 8–23)
CO2: 29 mmol/L (ref 22–32)
Calcium: 8.4 mg/dL — ABNORMAL LOW (ref 8.9–10.3)
Chloride: 104 mmol/L (ref 98–111)
Creatinine, Ser: 0.79 mg/dL (ref 0.61–1.24)
GFR, Estimated: >60 mL/min (ref >60)
Glucose, Bld: 109 mg/dL — ABNORMAL HIGH (ref 70–99)
Potassium: 4.2 mmol/L (ref 3.5–5.1)
Sodium: 140 mmol/L (ref 135–145)
Total Bilirubin: 3 mg/dL — ABNORMAL HIGH (ref 0.3–1.2)
Total Protein: 6.1 g/dL — ABNORMAL LOW (ref 6.5–8.1)

## 2022-07-17 LAB — URINALYSIS, ROUTINE W REFLEX MICROSCOPIC
Bilirubin Urine: NEGATIVE
Glucose, UA: NEGATIVE mg/dL
Hgb urine dipstick: NEGATIVE
Ketones, ur: NEGATIVE mg/dL
Leukocytes,Ua: NEGATIVE
Nitrite: NEGATIVE
Protein, ur: NEGATIVE mg/dL
Specific Gravity, Urine: 1.021 (ref 1.005–1.030)
pH: 5 (ref 5.0–8.0)

## 2022-07-17 LAB — CBC
HCT: 38.1 % — ABNORMAL LOW (ref 39.0–52.0)
Hemoglobin: 12.1 g/dL — ABNORMAL LOW (ref 13.0–17.0)
MCH: 32.6 pg (ref 26.0–34.0)
MCHC: 31.8 g/dL (ref 30.0–36.0)
MCV: 102.7 fL — ABNORMAL HIGH (ref 80.0–100.0)
Platelets: 118 10*3/uL — ABNORMAL LOW (ref 150–400)
RBC: 3.71 MIL/uL — ABNORMAL LOW (ref 4.22–5.81)
RDW: 11.7 % (ref 11.5–15.5)
WBC: 8.1 10*3/uL (ref 4.0–10.5)
nRBC: 0 % (ref 0.0–0.2)

## 2022-07-17 LAB — SURGICAL PCR SCREEN
MRSA, PCR: NEGATIVE
Staphylococcus aureus: NEGATIVE

## 2022-07-17 SURGERY — FIXATION, FRACTURE, INTERTROCHANTERIC, WITH INTRAMEDULLARY ROD
Anesthesia: General | Laterality: Left

## 2022-07-17 MED ORDER — CHLORHEXIDINE GLUCONATE 0.12 % MT SOLN
OROMUCOSAL | Status: AC
  Administered 2022-07-17: 15 mL via OROMUCOSAL
  Filled 2022-07-17: qty 15

## 2022-07-17 MED ORDER — FENTANYL CITRATE (PF) 250 MCG/5ML IJ SOLN
INTRAMUSCULAR | Status: AC
  Filled 2022-07-17: qty 5

## 2022-07-17 MED ORDER — ONDANSETRON HCL 4 MG/2ML IJ SOLN
INTRAMUSCULAR | Status: AC
  Filled 2022-07-17: qty 2

## 2022-07-17 MED ORDER — ROPIVACAINE HCL 5 MG/ML IJ SOLN
INTRAMUSCULAR | Status: DC | PRN
  Administered 2022-07-17: 30 mL via PERINEURAL

## 2022-07-17 MED ORDER — ONDANSETRON HCL 4 MG/2ML IJ SOLN: 4.0000 mg | Freq: Four times a day (QID) | INTRAMUSCULAR | Status: DC | PRN

## 2022-07-17 MED ORDER — SUGAMMADEX SODIUM 200 MG/2ML IV SOLN
INTRAVENOUS | Status: DC | PRN
  Administered 2022-07-17: 200 mg via INTRAVENOUS

## 2022-07-17 MED ORDER — MORPHINE SULFATE (PF) 4 MG/ML IV SOLN
INTRAVENOUS | Status: AC
  Filled 2022-07-17: qty 2

## 2022-07-17 MED ORDER — CEFAZOLIN SODIUM-DEXTROSE 2-4 GM/100ML-% IV SOLN
2.0000 g | Freq: Three times a day (TID) | INTRAVENOUS | Status: AC
  Administered 2022-07-17 – 2022-07-18 (×2): 2 g via INTRAVENOUS
  Filled 2022-07-17 (×2): qty 100

## 2022-07-17 MED ORDER — PHENOL 1.4 % MT LIQD: 1.0000 | OROMUCOSAL | Status: DC | PRN

## 2022-07-17 MED ORDER — SODIUM CHLORIDE 0.9 % IV BOLUS
500.0000 mL | Freq: Once | INTRAVENOUS | Status: AC
  Administered 2022-07-17: 500 mL via INTRAVENOUS

## 2022-07-17 MED ORDER — ORAL CARE MOUTH RINSE: 15.0000 mL | Freq: Once | OROMUCOSAL | Status: AC

## 2022-07-17 MED ORDER — MENTHOL 3 MG MT LOZG: 1.0000 | LOZENGE | OROMUCOSAL | Status: DC | PRN

## 2022-07-17 MED ORDER — FENTANYL CITRATE PF 50 MCG/ML IJ SOSY
PREFILLED_SYRINGE | INTRAMUSCULAR | Status: AC
  Filled 2022-07-17: qty 2

## 2022-07-17 MED ORDER — DOCUSATE SODIUM 100 MG PO CAPS
100.0000 mg | ORAL_CAPSULE | Freq: Two times a day (BID) | ORAL | Status: DC
  Administered 2022-07-17 – 2022-07-21 (×8): 100 mg via ORAL
  Filled 2022-07-17 (×8): qty 1

## 2022-07-17 MED ORDER — LACTATED RINGERS IV SOLN: INTRAVENOUS | Status: DC

## 2022-07-17 MED ORDER — ACETAMINOPHEN 500 MG PO TABS
500.0000 mg | ORAL_TABLET | Freq: Four times a day (QID) | ORAL | Status: AC
  Administered 2022-07-18 (×3): 500 mg via ORAL
  Filled 2022-07-17 (×3): qty 1

## 2022-07-17 MED ORDER — DEXAMETHASONE SODIUM PHOSPHATE 10 MG/ML IJ SOLN
INTRAMUSCULAR | Status: DC | PRN
  Administered 2022-07-17: 10 mg via INTRAVENOUS

## 2022-07-17 MED ORDER — TRANEXAMIC ACID-NACL 1000-0.7 MG/100ML-% IV SOLN
INTRAVENOUS | Status: AC
  Filled 2022-07-17: qty 100

## 2022-07-17 MED ORDER — LIDOCAINE 2% (20 MG/ML) 5 ML SYRINGE
INTRAMUSCULAR | Status: AC
  Filled 2022-07-17: qty 5

## 2022-07-17 MED ORDER — ACETAMINOPHEN 325 MG PO TABS: 325.0000 mg | ORAL_TABLET | Freq: Four times a day (QID) | ORAL | Status: DC | PRN

## 2022-07-17 MED ORDER — CHLORHEXIDINE GLUCONATE 4 % EX LIQD: 60.0000 mL | Freq: Once | CUTANEOUS | Status: DC

## 2022-07-17 MED ORDER — CLONIDINE HCL (ANALGESIA) 100 MCG/ML EP SOLN
EPIDURAL | Status: AC
  Filled 2022-07-17: qty 10

## 2022-07-17 MED ORDER — ACETAMINOPHEN 10 MG/ML IV SOLN
INTRAVENOUS | Status: AC
  Filled 2022-07-17: qty 100

## 2022-07-17 MED ORDER — CHLORHEXIDINE GLUCONATE 0.12 % MT SOLN: 15.0000 mL | Freq: Once | OROMUCOSAL | Status: AC

## 2022-07-17 MED ORDER — METOCLOPRAMIDE HCL 5 MG/ML IJ SOLN: 5.0000 mg | Freq: Three times a day (TID) | INTRAMUSCULAR | Status: DC | PRN

## 2022-07-17 MED ORDER — SODIUM CHLORIDE 0.9 % IV SOLN: INTRAVENOUS | Status: DC

## 2022-07-17 MED ORDER — HYDROCODONE-ACETAMINOPHEN 5-325 MG PO TABS
1.0000 | ORAL_TABLET | ORAL | Status: DC | PRN
  Administered 2022-07-18: 2 via ORAL
  Filled 2022-07-17 (×2): qty 2

## 2022-07-17 MED ORDER — ACETAMINOPHEN 10 MG/ML IV SOLN
INTRAVENOUS | Status: DC | PRN
  Administered 2022-07-17: 1000 mg via INTRAVENOUS

## 2022-07-17 MED ORDER — LIDOCAINE 2% (20 MG/ML) 5 ML SYRINGE
INTRAMUSCULAR | Status: DC | PRN
  Administered 2022-07-17: 60 mg via INTRAVENOUS

## 2022-07-17 MED ORDER — PROPOFOL 10 MG/ML IV BOLUS
INTRAVENOUS | Status: AC
  Filled 2022-07-17: qty 20

## 2022-07-17 MED ORDER — SODIUM CHLORIDE 0.9 % IV SOLN: INTRAVENOUS | Status: AC

## 2022-07-17 MED ORDER — LISINOPRIL 20 MG PO TABS
40.0000 mg | ORAL_TABLET | Freq: Every day | ORAL | Status: DC
  Administered 2022-07-19 – 2022-07-21 (×2): 40 mg via ORAL
  Filled 2022-07-17: qty 4
  Filled 2022-07-17 (×4): qty 2

## 2022-07-17 MED ORDER — DEXAMETHASONE SODIUM PHOSPHATE 4 MG/ML IJ SOLN
INTRAMUSCULAR | Status: DC | PRN
  Administered 2022-07-17: 10 mg via PERINEURAL

## 2022-07-17 MED ORDER — FENTANYL CITRATE (PF) 100 MCG/2ML IJ SOLN: 25.0000 ug | INTRAMUSCULAR | Status: DC | PRN

## 2022-07-17 MED ORDER — METHOCARBAMOL 500 MG PO TABS: 500.0000 mg | ORAL_TABLET | Freq: Four times a day (QID) | ORAL | Status: DC | PRN

## 2022-07-17 MED ORDER — DEXAMETHASONE SODIUM PHOSPHATE 10 MG/ML IJ SOLN
INTRAMUSCULAR | Status: AC
  Filled 2022-07-17: qty 1

## 2022-07-17 MED ORDER — CEFAZOLIN SODIUM-DEXTROSE 2-4 GM/100ML-% IV SOLN
INTRAVENOUS | Status: AC
  Filled 2022-07-17: qty 100

## 2022-07-17 MED ORDER — TRANEXAMIC ACID-NACL 1000-0.7 MG/100ML-% IV SOLN
1000.0000 mg | INTRAVENOUS | Status: AC
  Administered 2022-07-17: 1000 mg via INTRAVENOUS

## 2022-07-17 MED ORDER — PROPOFOL 10 MG/ML IV BOLUS
INTRAVENOUS | Status: DC | PRN
  Administered 2022-07-17: 130 mg via INTRAVENOUS

## 2022-07-17 MED ORDER — MORPHINE SULFATE (PF) 2 MG/ML IV SOLN: 0.5000 mg | INTRAVENOUS | Status: DC | PRN

## 2022-07-17 MED ORDER — MORPHINE SULFATE 4 MG/ML IJ SOLN
INTRAMUSCULAR | Status: DC | PRN
  Administered 2022-07-17: 33 mL via INTRAMUSCULAR

## 2022-07-17 MED ORDER — TRANEXAMIC ACID-NACL 1000-0.7 MG/100ML-% IV SOLN
1000.0000 mg | Freq: Once | INTRAVENOUS | Status: AC
  Administered 2022-07-17: 1000 mg via INTRAVENOUS
  Filled 2022-07-17: qty 100

## 2022-07-17 MED ORDER — FENTANYL CITRATE PF 50 MCG/ML IJ SOSY
50.0000 ug | PREFILLED_SYRINGE | INTRAMUSCULAR | Status: DC
  Administered 2022-07-17: 50 ug via INTRAVENOUS

## 2022-07-17 MED ORDER — ONDANSETRON HCL 4 MG/2ML IJ SOLN
INTRAMUSCULAR | Status: DC | PRN
  Administered 2022-07-17: 4 mg via INTRAVENOUS

## 2022-07-17 MED ORDER — ROCURONIUM BROMIDE 10 MG/ML (PF) SYRINGE
PREFILLED_SYRINGE | INTRAVENOUS | Status: DC | PRN
  Administered 2022-07-17: 20 mg via INTRAVENOUS
  Administered 2022-07-17: 50 mg via INTRAVENOUS

## 2022-07-17 MED ORDER — ASPIRIN 81 MG PO TBEC
81.0000 mg | DELAYED_RELEASE_TABLET | Freq: Two times a day (BID) | ORAL | Status: DC
  Administered 2022-07-17 – 2022-07-21 (×8): 81 mg via ORAL
  Filled 2022-07-17 (×8): qty 1

## 2022-07-17 MED ORDER — EPHEDRINE SULFATE-NACL 50-0.9 MG/10ML-% IV SOSY
PREFILLED_SYRINGE | INTRAVENOUS | Status: DC | PRN
  Administered 2022-07-17: 2.5 mg via INTRAVENOUS

## 2022-07-17 MED ORDER — METHOCARBAMOL 1000 MG/10ML IJ SOLN: 500.0000 mg | Freq: Four times a day (QID) | INTRAVENOUS | Status: DC | PRN

## 2022-07-17 MED ORDER — METOCLOPRAMIDE HCL 5 MG PO TABS: 5.0000 mg | ORAL_TABLET | Freq: Three times a day (TID) | ORAL | Status: DC | PRN

## 2022-07-17 MED ORDER — CEFAZOLIN SODIUM-DEXTROSE 2-4 GM/100ML-% IV SOLN
2.0000 g | INTRAVENOUS | Status: AC
  Administered 2022-07-17: 2 g via INTRAVENOUS

## 2022-07-17 MED ORDER — BUPIVACAINE HCL (PF) 0.25 % IJ SOLN
INTRAMUSCULAR | Status: AC
  Filled 2022-07-17: qty 30

## 2022-07-17 MED ORDER — POVIDONE-IODINE 10 % EX SWAB
2.0000 | Freq: Once | CUTANEOUS | Status: AC
  Administered 2022-07-17: 2 via TOPICAL

## 2022-07-17 MED ORDER — ONDANSETRON HCL 4 MG PO TABS: 4.0000 mg | ORAL_TABLET | Freq: Four times a day (QID) | ORAL | Status: DC | PRN

## 2022-07-17 MED ORDER — PHENYLEPHRINE HCL-NACL 20-0.9 MG/250ML-% IV SOLN
INTRAVENOUS | Status: DC | PRN
  Administered 2022-07-17: 25 ug/min via INTRAVENOUS

## 2022-07-17 MED ORDER — FENTANYL CITRATE (PF) 250 MCG/5ML IJ SOLN
INTRAMUSCULAR | Status: DC | PRN
  Administered 2022-07-17: 100 ug via INTRAVENOUS
  Administered 2022-07-17: 50 ug via INTRAVENOUS

## 2022-07-17 MED ORDER — PHENYLEPHRINE 80 MCG/ML (10ML) SYRINGE FOR IV PUSH (FOR BLOOD PRESSURE SUPPORT)
PREFILLED_SYRINGE | INTRAVENOUS | Status: DC | PRN
  Administered 2022-07-17: 160 ug via INTRAVENOUS
  Administered 2022-07-17: 40 ug via INTRAVENOUS
  Administered 2022-07-17: 160 ug via INTRAVENOUS

## 2022-07-17 SURGICAL SUPPLY — 47 items
BAG COUNTER SPONGE SURGICOUNT (BAG) ×1 IMPLANT
BAG SPNG CNTER NS LX DISP (BAG) ×1
BIT DRILL INTERTAN LAG SCREW (BIT) IMPLANT
BLADE CLIPPER SURG (BLADE) IMPLANT
BLADE SURG 15 STRL LF DISP TIS (BLADE) ×1 IMPLANT
BLADE SURG 15 STRL SS (BLADE) ×1
CLSR STERI-STRIP ANTIMIC 1/2X4 (GAUZE/BANDAGES/DRESSINGS) IMPLANT
COVER PERINEAL POST (MISCELLANEOUS) ×1 IMPLANT
COVER SURGICAL LIGHT HANDLE (MISCELLANEOUS) ×1 IMPLANT
DRAPE HALF SHEET 40X57 (DRAPES) IMPLANT
DRAPE ORTHO SPLIT 77X108 STRL (DRAPES)
DRAPE STERI IOBAN 125X83 (DRAPES) ×1 IMPLANT
DRAPE SURG ORHT 6 SPLT 77X108 (DRAPES) IMPLANT
DRSG AQUACEL AG ADV 3.5X 6 (GAUZE/BANDAGES/DRESSINGS) IMPLANT
DRSG MEPILEX BORDER 4X4 (GAUZE/BANDAGES/DRESSINGS) IMPLANT
DRSG MEPILEX BORDER 4X8 (GAUZE/BANDAGES/DRESSINGS) ×1 IMPLANT
DURAPREP 26ML APPLICATOR (WOUND CARE) ×1 IMPLANT
ELECT REM PT RETURN 9FT ADLT (ELECTROSURGICAL) ×1
ELECTRODE REM PT RTRN 9FT ADLT (ELECTROSURGICAL) ×1 IMPLANT
FACESHIELD WRAPAROUND (MASK) ×1 IMPLANT
FACESHIELD WRAPAROUND OR TEAM (MASK) ×1 IMPLANT
GAUZE XEROFORM 5X9 LF (GAUZE/BANDAGES/DRESSINGS) ×1 IMPLANT
GLOVE BIOGEL PI IND STRL 8 (GLOVE) ×1 IMPLANT
GLOVE ECLIPSE 8.0 STRL XLNG CF (GLOVE) ×1 IMPLANT
GOWN STRL REUS W/ TWL LRG LVL3 (GOWN DISPOSABLE) ×1 IMPLANT
GOWN STRL REUS W/ TWL XL LVL3 (GOWN DISPOSABLE) IMPLANT
GOWN STRL REUS W/TWL LRG LVL3 (GOWN DISPOSABLE) ×1
GOWN STRL REUS W/TWL XL LVL3 (GOWN DISPOSABLE)
GUIDE PIN 3.2X343 (PIN) ×2
GUIDE PIN 3.2X343MM (PIN) ×2
KIT BASIN OR (CUSTOM PROCEDURE TRAY) ×1 IMPLANT
KIT TURNOVER KIT B (KITS) ×1 IMPLANT
MANIFOLD NEPTUNE II (INSTRUMENTS) ×1 IMPLANT
NAIL TRIGEN 10MMX40CM-125 LEFT (Nail) IMPLANT
NS IRRIG 1000ML POUR BTL (IV SOLUTION) ×1 IMPLANT
PACK GENERAL/GYN (CUSTOM PROCEDURE TRAY) ×1 IMPLANT
PAD ARMBOARD 7.5X6 YLW CONV (MISCELLANEOUS) ×2 IMPLANT
PIN GUIDE 3.2X343MM (PIN) IMPLANT
SCREW LAG COMPR KIT 110/105 (Screw) IMPLANT
SPONGE T-LAP 4X18 ~~LOC~~+RFID (SPONGE) IMPLANT
STAPLER VISISTAT 35W (STAPLE) ×1 IMPLANT
SUT ETHILON 2 0 FS 18 (SUTURE) IMPLANT
SUT VIC AB 2-0 CTB1 (SUTURE) ×1 IMPLANT
TAPE STRIPS DRAPE STRL (GAUZE/BANDAGES/DRESSINGS) IMPLANT
TOWEL GREEN STERILE (TOWEL DISPOSABLE) ×1 IMPLANT
TOWEL GREEN STERILE FF (TOWEL DISPOSABLE) ×1 IMPLANT
WATER STERILE IRR 1000ML POUR (IV SOLUTION) ×1 IMPLANT

## 2022-07-17 NOTE — Anesthesia Procedure Notes (Signed)
Procedure Name: Intubation Date/Time: 07/17/2022 4:37 PM  Performed by: Rande Brunt, CRNAPre-anesthesia Checklist: Patient identified, Emergency Drugs available, Suction available and Patient being monitored Patient Re-evaluated:Patient Re-evaluated prior to induction Oxygen Delivery Method: Circle System Utilized Preoxygenation: Pre-oxygenation with 100% oxygen Induction Type: IV induction Ventilation: Mask ventilation without difficulty Laryngoscope Size: Miller and 2 Grade View: Grade I Tube type: Oral Tube size: 7.5 mm Number of attempts: 1 Airway Equipment and Method: Stylet and Oral airway Placement Confirmation: ETT inserted through vocal cords under direct vision, positive ETCO2 and breath sounds checked- equal and bilateral Secured at: 22 cm Tube secured with: Tape Dental Injury: Teeth and Oropharynx as per pre-operative assessment

## 2022-07-17 NOTE — ED Notes (Signed)
[facility] called for transport. 

## 2022-07-17 NOTE — Op Note (Unsigned)
NAME: John Riley, John Riley MEDICAL RECORD NO: [id number] ACCOUNT NO: [id number] DATE OF BIRTH: 06/15/1932 FACILITY: [facility] LOCATION: [facility] PHYSICIAN: Yetta Barre. Marlou Sa, MD  Operative Report   DATE OF PROCEDURE: 07/17/2022  PREOPERATIVE DIAGNOSIS:  Left hip intertrochanteric fracture.  POSTOPERATIVE DIAGNOSIS:  Left hip intertrochanteric fracture.  PROCEDURE:  Left hip intertrochanteric fracture open reduction and internal fixation using Smith and Nephew Intertan nail 10 x 40 mm with 1 central lag screw and 1 central compression screw,  SURGEON:  Yetta Barre. Marlou Sa, MD  ASSISTANT:  Annie Main, PA.  INDICATIONS:  [name] is an 88 year old patient with left hip pain who presents for operative management after explanation of risks and benefits.  PROCEDURE IN DETAIL:  The patient was brought to the operating room where general anesthetic was induced.  Preoperative antibiotics administered.  Timeout was called.  TXA administered.  The patient was placed on the fracture bed with the right leg in lithotomy position, well padded on the peroneal nerve.  Left leg was placed under traction and slight internal rotation.  Fracture was reduced and confirmation was made under fluoroscopic  evaluation.  Left hip was then pre-scrubbed with alcohol and Betadine, allowed to air dry, prepped with DuraPrep solution and draped in sterile manner.  Wall drape was utilized.  Timeout was called.  Incision made 4 fingerbreadths proximal to the tip of  the trochanter.  Skin and subcutaneous tissue were sharply divided.  In accordance with preoperative templating, the tip of the trochanter was identified with the guidewire, which was then placed across the fracture site into the canal.  Reasonable  reduction of the fracture was obtained in the AP and lateral planes.  Proximal reaming performed.  Nail was placed across the fracture and in the center-center portion of the head and neck region.  The lag screw and compression  screw were placed.   Traction released in order to obtain maximal bony contact.  The patient did have a slightly narrow femoral neck.  Nonetheless, the position of the screws in the head and neck region was very good.  Fracture was reduced reasonably well, but not  anatomically.  Thorough irrigation was then performed.  The fracture moved as a unit.  Distal interlocking screw was not required.  The 2 incisions, 1 for the nail insertion and the other for the lag screw insertions were then irrigated and then closed  using 0 Vicryl suture, 2-0 Vicryl suture, and 3-0 Monocryl.  Numbing medicine placed into each incision, which consisted of Marcaine, morphine, clonidine.  Aquacel dressing placed after Steri-Strips were applied to the incision.  The patient tolerated  the procedure well without immediate complications, transferred to the recovery room in stable condition.  Luke's assistance was required at all times for retraction, opening, closing, mobilization of tissue.  His assistance was a medical necessity.   MUK D: 07/17/2022 6:34:26 pm T: 07/17/2022 10:53:00 pm  JOB: [id number]/ [id number]

## 2022-07-17 NOTE — Plan of Care (Signed)

## 2022-07-17 NOTE — Progress Notes (Addendum)
0930  Assisted Dr. Criss Rosales with left, pericapsular nerve group (PENG), ultrasound guided block. Side rails up, monitors on throughout procedure. See vital signs in flow sheet. Tolerated Procedure well.  0958 returned pt. Back to ED;  VSS,  awake. Report to RN in ED.  Updated daughter in the ED room.

## 2022-07-17 NOTE — Anesthesia Postprocedure Evaluation (Signed)
Anesthesia Post Note  Patient: John Riley  Procedure(s) Performed: INTRAMEDULLARY (IM) NAIL INTERTROCHANTERIC (Left)     Patient location during evaluation: PACU Anesthesia Type: General Level of consciousness: awake and alert Pain management: pain level controlled Vital Signs Assessment: post-procedure vital signs reviewed and stable Respiratory status: spontaneous breathing, nonlabored ventilation, respiratory function stable and patient connected to nasal cannula oxygen Cardiovascular status: blood pressure returned to baseline and stable Postop Assessment: no apparent nausea or vomiting Anesthetic complications: no  No notable events documented.  Last Vitals:  Vitals:   07/17/22 1945 07/17/22 2034  BP: 109/60 (!) 129/90  Pulse: (!) 59 64  Resp: 14 16  Temp: 36.6 C 36.6 C  SpO2: 94% 97%    Last Pain:  Vitals:   07/17/22 2034  TempSrc: Oral  PainSc: 0-No pain                 Amoura Ransier,W. EDMOND

## 2022-07-17 NOTE — Anesthesia Preprocedure Evaluation (Signed)
Anesthesia Evaluation  Patient identified by MRN, date of birth, ID band Patient awake    Reviewed: Allergy & Precautions, H&P , Patient's Chart, lab work & pertinent test results  Airway Mallampati: II  TM Distance: >3 FB Neck ROM: Full    Dental no notable dental hx.    Pulmonary neg pulmonary ROS, former smoker   Pulmonary exam normal breath sounds clear to auscultation       Cardiovascular hypertension, Normal cardiovascular exam Rhythm:Regular Rate:Normal     Neuro/Psych negative neurological ROS  negative psych ROS   GI/Hepatic negative GI ROS, Neg liver ROS,,,  Endo/Other  negative endocrine ROS    Renal/GU negative Renal ROS  negative genitourinary   Musculoskeletal L intertrochanteric femur fx   Abdominal   Peds negative pediatric ROS (+)  Hematology negative hematology ROS (+)   Anesthesia Other Findings   Reproductive/Obstetrics negative OB ROS                             Anesthesia Physical Anesthesia Plan  ASA: 3  Anesthesia Plan: Regional   Post-op Pain Management:    Induction:   PONV Risk Score and Plan:   Airway Management Planned: Natural Airway  Additional Equipment: None  Intra-op Plan:   Post-operative Plan:   Informed Consent: I have reviewed the patients History and Physical, chart, labs and discussed the procedure including the risks, benefits and alternatives for the proposed anesthesia with the patient or authorized representative who has indicated his/her understanding and acceptance.       Plan Discussed with: CRNA  Anesthesia Plan Comments:        Anesthesia Quick Evaluation

## 2022-07-17 NOTE — Anesthesia Procedure Notes (Signed)
Anesthesia Regional Block: Peng block   Pre-Anesthetic Checklist: , timeout performed,  Correct Patient, Correct Site, Correct Laterality,  Correct Procedure, Correct Position, site marked,  Risks and benefits discussed,  Surgical consent,  Pre-op evaluation,  At surgeon's request and post-op pain management  Laterality: Left  Prep: Maximum Sterile Barrier Precautions used, chloraprep       Needles:  Injection technique: Single-shot  Needle Type: Echogenic Stimulator Needle     Needle Length: 9cm  Needle Gauge: 22     Additional Needles:   Procedures:,,,, ultrasound used (permanent image in chart),,    Narrative:  Start time: 07/17/2022 9:23 AM End time: 07/17/2022 9:33 AM Injection made incrementally with aspirations every 5 mL.  Performed by: Personally  Anesthesiologist: Pervis Hocking, DO  Additional Notes: Monitors applied. No increased pain on injection. No increased resistance to injection. Injection made in 5cc increments. Good needle visualization. Patient tolerated procedure well.

## 2022-07-17 NOTE — Consult Note (Signed)
Reason for Consult:Left hip fx Referring Physician: Barb Merino Time called: 1226 Time at bedside: John Riley is an 88 y.o. male.  HPI: Tyce was in his kitchen washing dishes. He turned and thinks the mat he was standing on slid and he fell onto his left side. He had immediate pain and could not get up. He was brought to the ED at [facility] where x-rays showed a left hip fx and orthopedic surgery was consulted. He was transferred to [facility] for definitive care. He lives at home alone and generally ambulates with a cane.  Past Medical History:  Diagnosis Date   ELEVATED PROSTATE SPECIFIC ANTIGEN 07/31/2009   HYPERLIPIDEMIA 06/28/2007   HYPERTENSION 06/28/2007   INSOMNIA 12/07/2007   Non-small cell lung cancer ([place]) 2009   PULMONARY NODULE, RIGHT MIDDLE LOBE 12/07/2007   TOBACCO ABUSE 11/09/2007    Past Surgical History:  Procedure Laterality Date   APPENDECTOMY     CATARACT EXTRACTION Left 10/30/2021   LOBECTOMY     right middle   PROSTATE SURGERY     bx   VEIN LIGATION AND STRIPPING Left 1974    History reviewed. No pertinent family history.  Social History:  reports that he quit smoking about 14 years ago. His smoking use included cigarettes. He has never used smokeless tobacco. He reports that he does not drink alcohol and does not use drugs.  Allergies:  Allergies  Allergen Reactions   Niacin     Medications: I have reviewed the patient's current medications.  Results for orders placed or performed during the hospital encounter of 07/16/22 (from the past 48 hour(s))  CBC with Differential     Status: Abnormal   Collection Time: 07/16/22  8:55 PM  Result Value Ref Range   WBC 18.1 (H) 4.0 - 10.5 K/uL   RBC 4.15 (L) 4.22 - 5.81 MIL/uL   Hemoglobin 13.5 13.0 - 17.0 g/dL   HCT 41.8 39.0 - 52.0 %   MCV 100.7 (H) 80.0 - 100.0 fL   MCH 32.5 26.0 - 34.0 pg   MCHC 32.3 30.0 - 36.0 g/dL   RDW 11.6 11.5 - 15.5 %   Platelets 147 (L) 150 - 400 K/uL   nRBC 0.0 0.0 -  0.2 %   Neutrophils Relative % 91 %   Neutro Abs 16.4 (H) 1.7 - 7.7 K/uL   Lymphocytes Relative 3 %   Lymphs Abs 0.5 (L) 0.7 - 4.0 K/uL   Monocytes Relative 5 %   Monocytes Absolute 1.0 0.1 - 1.0 K/uL   Eosinophils Relative 0 %   Eosinophils Absolute 0.0 0.0 - 0.5 K/uL   Basophils Relative 0 %   Basophils Absolute 0.0 0.0 - 0.1 K/uL   Immature Granulocytes 1 %   Abs Immature Granulocytes 0.15 (H) 0.00 - 0.07 K/uL    Comment: Performed at [facility], [place] [address] [place], [place] [address]  Comprehensive metabolic panel     Status: Abnormal   Collection Time: 07/16/22  8:55 PM  Result Value Ref Range   Sodium 141 135 - 145 mmol/L   Potassium 4.1 3.5 - 5.1 mmol/L   Chloride 106 98 - 111 mmol/L   CO2 28 22 - 32 mmol/L   Glucose, Bld 118 (H) 70 - 99 mg/dL    Comment: Glucose reference range applies only to samples taken after fasting for at least 8 hours.   BUN 20 8 - 23 mg/dL   Creatinine, Ser 0.75 0.61 -  1.24 mg/dL   Calcium 8.7 (L) 8.9 - 10.3 mg/dL   Total Protein 6.5 6.5 - 8.1 g/dL   Albumin 3.6 3.5 - 5.0 g/dL   AST 21 15 - 41 U/L   ALT 18 0 - 44 U/L   Alkaline Phosphatase 39 38 - 126 U/L   Total Bilirubin 2.0 (H) 0.3 - 1.2 mg/dL   GFR, Estimated >60 >60 mL/min    Comment: (NOTE) Calculated using the CKD-EPI Creatinine Equation (2021)    Anion gap 7 5 - 15    Comment: Performed at [facility], [place] [address] [place], [place] [address]  Protime-INR     Status: None   Collection Time: 07/16/22  8:55 PM  Result Value Ref Range   Prothrombin Time 14.1 11.4 - 15.2 seconds   INR 1.1 0.8 - 1.2    Comment: (NOTE) INR goal varies based on device and disease states. Performed at [facility], [place] [address] [place], [place] [address]   Type and screen     Status: None   Collection Time: 07/16/22  8:55 PM  Result Value Ref Range   ABO/RH(D) A POS    Antibody Screen NEG    Sample Expiration       07/19/2022,2359 Performed at [place] [address], [place], [place] [address]   Blood gas, venous (at [facility], not at [facility])     Status: Abnormal   Collection Time: 07/16/22  8:55 PM  Result Value Ref Range   pH, Ven 7.39 7.25 - 7.43   pCO2, Ven 53 44 - 60 mmHg   pO2, Ven 34 32 - 45 mmHg   Bicarbonate 32.1 (H) 20.0 - 28.0 mmol/L   Acid-Base Excess 5.7 (H) 0.0 - 2.0 mmol/L   O2 Saturation 51.1 %   Patient temperature 37.0     Comment: Performed at [facility], [place] [address], [place], [place] [address]  SARS Coronavirus 2 by RT PCR (hospital order, performed in [facility] hospital lab) *cepheid single result test* Anterior Nasal Swab     Status: None   Collection Time: 07/16/22  8:55 PM   Specimen: Anterior Nasal Swab  Result Value Ref Range   SARS Coronavirus 2 by RT PCR NEGATIVE NEGATIVE    Comment: (NOTE) SARS-CoV-2 target nucleic acids are NOT DETECTED.  The SARS-CoV-2 RNA is generally detectable in upper and lower respiratory specimens during the acute phase of infection. The lowest concentration of SARS-CoV-2 viral copies this assay can detect is 250 copies / mL. A negative result does not preclude SARS-CoV-2 infection and should not be used as the sole basis for treatment or other patient management decisions.  A negative result may occur with improper specimen collection / handling, submission of specimen other than nasopharyngeal swab, presence of viral mutation(s) within the areas targeted by this assay, and inadequate number of viral copies (<250 copies / mL). A negative result must be combined with clinical observations, patient history, and epidemiological information.  Fact Sheet for Patients:   https://www.patel.info/  Fact Sheet for Healthcare Providers: https://hall.com/  This test is not yet approved or  cleared by the [place] FDA and has been authorized for  detection and/or diagnosis of SARS-CoV-2 by FDA under an Emergency Use Authorization (EUA).  This EUA will remain in effect (meaning this test can be used) for the duration of the COVID-19 declaration under Section 564(b)(1) of the Act, 21 U.S.C. section 360bbb-3(b)(1), unless the  authorization is terminated or revoked sooner.  Performed at [facility], [place], [place] [address] [place], [place] [address]   Surgical pcr screen     Status: None   Collection Time: 07/17/22 10:28 AM   Specimen: Nasal Mucosa; Nasal Swab  Result Value Ref Range   MRSA, PCR NEGATIVE NEGATIVE   Staphylococcus aureus NEGATIVE NEGATIVE    Comment: (NOTE) The Xpert SA Assay (FDA approved for NASAL specimens in patients 71 years of age and older), is one component of a comprehensive surveillance program. It is not intended to diagnose infection nor to guide or monitor treatment. Performed at [facility], [place] [address] [place], [place] [address]   CBC     Status: Abnormal   Collection Time: 07/17/22 10:43 AM  Result Value Ref Range   WBC 8.1 4.0 - 10.5 K/uL   RBC 3.71 (L) 4.22 - 5.81 MIL/uL   Hemoglobin 12.1 (L) 13.0 - 17.0 g/dL   HCT 38.1 (L) 39.0 - 52.0 %   MCV 102.7 (H) 80.0 - 100.0 fL   MCH 32.6 26.0 - 34.0 pg   MCHC 31.8 30.0 - 36.0 g/dL   RDW 11.7 11.5 - 15.5 %   Platelets 118 (L) 150 - 400 K/uL   nRBC 0.0 0.0 - 0.2 %    Comment: Performed at [facility], [place] [address], [place], [place] [address]  Basic metabolic panel     Status: Abnormal   Collection Time: 07/17/22 10:43 AM  Result Value Ref Range   Sodium 141 135 - 145 mmol/L   Potassium 2.9 (L) 3.5 - 5.1 mmol/L    Comment: DELTA CHECK NOTED   Chloride 112 (H) 98 - 111 mmol/L   CO2 26 22 - 32 mmol/L   Glucose, Bld 91 70 - 99 mg/dL    Comment: Glucose reference range applies only to samples taken after fasting for at least 8 hours.   BUN 15 8 - 23 mg/dL   Creatinine, Ser 0.62 0.61 -  1.24 mg/dL   Calcium 6.4 (LL) 8.9 - 10.3 mg/dL    Comment: CRITICAL RESULT CALLED TO, READ BACK BY AND VERIFIED WITH HUMBLIN, H @1119  ON 07/17/2022 BY XIONG, K    GFR, Estimated >60 >60 mL/min    Comment: (NOTE) Calculated using the CKD-EPI Creatinine Equation (2021)    Anion gap 3 (L) 5 - 15    Comment: Performed at [facility], [place] [address], [place], [place] [address]  Comprehensive metabolic panel     Status: Abnormal   Collection Time: 07/17/22 11:25 AM  Result Value Ref Range   Sodium 140 135 - 145 mmol/L   Potassium 4.2 3.5 - 5.1 mmol/L    Comment: DELTA CHECK NOTED   Chloride 104 98 - 111 mmol/L   CO2 29 22 - 32 mmol/L   Glucose, Bld 109 (H) 70 - 99 mg/dL    Comment: Glucose reference range applies only to samples taken after fasting for at least 8 hours.   BUN 19 8 - 23 mg/dL   Creatinine, Ser 0.79 0.61 - 1.24 mg/dL   Calcium 8.4 (L) 8.9 - 10.3 mg/dL    Comment: DELTA CHECK NOTED   Total Protein 6.1 (L) 6.5 - 8.1 g/dL   Albumin 3.3 (L) 3.5 - 5.0 g/dL   AST 21 15 - 41 U/L   ALT 17 0 - 44 U/L   Alkaline Phosphatase 36 (L) 38 - 126 U/L   Total Bilirubin  3.0 (H) 0.3 - 1.2 mg/dL   GFR, Estimated >60 >60 mL/min    Comment: (NOTE) Calculated using the CKD-EPI Creatinine Equation (2021)    Anion gap 7 5 - 15    Comment: Performed at [facility], [place] [address] [place], [place] [address]  CBC with Differential/Platelet     Status: Abnormal   Collection Time: 07/17/22 11:25 AM  Result Value Ref Range   WBC 10.8 (H) 4.0 - 10.5 K/uL   RBC 4.14 (L) 4.22 - 5.81 MIL/uL   Hemoglobin 13.4 13.0 - 17.0 g/dL   HCT 41.8 39.0 - 52.0 %   MCV 101.0 (H) 80.0 - 100.0 fL   MCH 32.4 26.0 - 34.0 pg   MCHC 32.1 30.0 - 36.0 g/dL   RDW 11.7 11.5 - 15.5 %   Platelets 128 (L) 150 - 400 K/uL   nRBC 0.0 0.0 - 0.2 %   Neutrophils Relative % 90 %   Neutro Abs 9.7 (H) 1.7 - 7.7 K/uL   Lymphocytes Relative 4 %   Lymphs Abs 0.4 (L) 0.7 - 4.0 K/uL    Monocytes Relative 6 %   Monocytes Absolute 0.6 0.1 - 1.0 K/uL   Eosinophils Relative 0 %   Eosinophils Absolute 0.0 0.0 - 0.5 K/uL   Basophils Relative 0 %   Basophils Absolute 0.0 0.0 - 0.1 K/uL   Immature Granulocytes 0 %   Abs Immature Granulocytes 0.04 0.00 - 0.07 K/uL    Comment: Performed at [facility], [place] [address], [place], [place] [address]  Urinalysis, Routine w reflex microscopic Urine, Clean Catch     Status: None   Collection Time: 07/17/22 11:39 AM  Result Value Ref Range   Color, Urine YELLOW YELLOW   APPearance CLEAR CLEAR   Specific Gravity, Urine 1.021 1.005 - 1.030   pH 5.0 5.0 - 8.0   Glucose, UA NEGATIVE NEGATIVE mg/dL   Hgb urine dipstick NEGATIVE NEGATIVE   Bilirubin Urine NEGATIVE NEGATIVE   Ketones, ur NEGATIVE NEGATIVE mg/dL   Protein, ur NEGATIVE NEGATIVE mg/dL   Nitrite NEGATIVE NEGATIVE   Leukocytes,Ua NEGATIVE NEGATIVE    Comment: Performed at [facility], [place] [address] [place], [place] [address]    DG Chest 1 View  Result Date: 07/16/2022 CLINICAL DATA:  Fall from standing EXAM: CHEST  1 VIEW COMPARISON:  Radiographs 05/31/2012 FINDINGS: No displaced rib fractures. No pneumothorax or pleural effusion. Elevated right hemidiaphragm. Bibasilar atelectasis/consolidation. Biapical scarring. Postoperative changes right lower lung. Widening of the superior mediastinum is likely due to patient leftward rotation. IMPRESSION: No acute abnormality. Electronically Signed   By: Placido Sou M.D.   On: 07/16/2022 20:03   DG Pelvis 1-2 Views  Result Date: 07/16/2022 CLINICAL DATA:  Left hip pain after fall. EXAM: PELVIS - 1-2 VIEW; LEFT FEMUR 2 VIEWS COMPARISON:  None Available. FINDINGS: Acute left intertrochanteric femur fracture. No dislocation. Degenerative changes of the hips and left knee. Soft tissues are unremarkable. IMPRESSION: 1. Acute left intertrochanteric femur fracture. Electronically Signed   By:  Titus Dubin M.D.   On: 07/16/2022 20:02   DG Femur Min 2 Views Left  Result Date: 07/16/2022 CLINICAL DATA:  Left hip pain after fall. EXAM: PELVIS - 1-2 VIEW; LEFT FEMUR 2 VIEWS COMPARISON:  None Available. FINDINGS: Acute left intertrochanteric femur fracture. No dislocation. Degenerative changes of the hips and left knee. Soft tissues are unremarkable. IMPRESSION: 1. Acute left intertrochanteric femur fracture. Electronically Signed   By:  Titus Dubin M.D.   On: 07/16/2022 20:02    Review of Systems  HENT:  Negative for ear discharge, ear pain, hearing loss and tinnitus.   Eyes:  Negative for photophobia and pain.  Respiratory:  Negative for cough and shortness of breath.   Cardiovascular:  Negative for chest pain.  Gastrointestinal:  Negative for abdominal pain, nausea and vomiting.  Genitourinary:  Negative for dysuria, flank pain, frequency and urgency.  Musculoskeletal:  Positive for arthralgias (Left hip and knee). Negative for back pain, myalgias and neck pain.  Neurological:  Negative for dizziness and headaches.  Hematological:  Does not bruise/bleed easily.  Psychiatric/Behavioral:  The patient is not nervous/anxious.    Blood pressure 96/83, pulse 66, temperature 98.6 F (37 C), temperature source Oral, resp. rate 20, SpO2 93 %. Physical Exam Constitutional:      General: He is not in acute distress.    Appearance: He is well-developed. He is not diaphoretic.  HENT:     Head: Normocephalic and atraumatic.  Eyes:     General: No scleral icterus.       Right eye: No discharge.        Left eye: No discharge.     Conjunctiva/sclera: Conjunctivae normal.  Cardiovascular:     Rate and Rhythm: Normal rate and regular rhythm.  Pulmonary:     Effort: Pulmonary effort is normal. No respiratory distress.  Musculoskeletal:     Cervical back: Normal range of motion.     Comments: LLE No traumatic wounds, ecchymosis, or rash  Mod TTP hip  No knee or ankle effusion  Knee  stable to varus/ valgus and anterior/posterior stress  Sens DPN, SPN, TN intact  Motor EHL, ext, flex, evers 5/5  DP 2+, PT 1+, No significant edema  Skin:    General: Skin is warm and dry.  Neurological:     Mental Status: He is alert.  Psychiatric:        Mood and Affect: Mood normal.        Behavior: Behavior normal.     Assessment/Plan: Left hip fx -- Plan IMN today with Dr. Marlou Sa. Multiple medical problems including non-small cell lung cancer, prostate cancer, hypertension, and hyperlipidemia -- per primary service    Lisette Abu, PA-C Orthopedic Surgery [phone] 07/17/2022, 3:00 PM

## 2022-07-17 NOTE — Transfer of Care (Signed)
Immediate Anesthesia Transfer of Care Note  Patient: John Riley  Procedure(s) Performed: INTRAMEDULLARY (IM) NAIL INTERTROCHANTERIC (Left)  Patient Location: PACU  Anesthesia Type:General  Level of Consciousness: awake, drowsy, and patient cooperative  Airway & Oxygen Therapy: Patient Spontanous Breathing  Post-op Assessment: Report given to RN, Post -op Vital signs reviewed and stable, and Patient moving all extremities X 4  Post vital signs: Reviewed and stable  Last Vitals:  Vitals Value Taken Time  BP 110/55 07/17/22 1825  Temp 36.3 C 07/17/22 1825  Pulse 67 07/17/22 1826  Resp 18 07/17/22 1826  SpO2 90 % 07/17/22 1826  Vitals shown include unvalidated device data.  Last Pain:  Vitals:   07/17/22 1449  TempSrc:   PainSc: 10-Worst pain ever         Complications: No notable events documented.

## 2022-07-17 NOTE — Brief Op Note (Signed)
   07/17/2022  6:21 PM  PATIENT:  Rachael Fee  88 y.o. male  PRE-OPERATIVE DIAGNOSIS:  left intertrochanteric fracture  POST-OPERATIVE DIAGNOSIS:  left intertrochanteric fracture  PROCEDURE:  Procedure(s): INTRAMEDULLARY (IM) NAIL INTERTROCHANTERIC  SURGEON:  Surgeon(s): Marlou Sa, Tonna Corner, MD  ASSISTANT: Magnant PA  ANESTHESIA:   General  EBL: 75 ml    Total I/O In: 300 [IV Piggyback:300] Out: 75 [Blood:75]  BLOOD ADMINISTERED: none  DRAINS: None  LOCAL MEDICATIONS USED: Marcaine morphine clonidine   SPECIMEN:  No Specimen  COUNTS:  YES  TOURNIQUET:  * No tourniquets in log *  DICTATION: .Other Dictation: Dictation Number done  PLAN OF CARE: Admit to inpatient   PATIENT DISPOSITION:  PACU - hemodynamically stable

## 2022-07-17 NOTE — Care Management (Signed)
Labs are drastically different from last night . Probably lab error/ draw error. Recheck and confirm.

## 2022-07-17 NOTE — Progress Notes (Signed)
PROGRESS NOTE    John Riley  TDD:[id number] DOB: 11/30/1931 DOA: 07/16/2022 PCP: Dorothyann Peng, NP    Brief Narrative:  88 year old with history of non-small cell lung cancer, prostate cancer and prostatism, hypertension hyperlipidemia presented from home with mechanical fall.  In the emergency room hemodynamically stable.  He was found to have left hip fracture.  Plan for surgery today at [facility].   Assessment & Plan:   Closed traumatic left intertrochanteric fracture with displacement: N.p.o., IV fluids, adequate IV and oral opiates for pain relief, SCDs for prophylaxis, opiates along with laxatives.  Bedrest until surgery. Scheduled for ORIF with Dr. Marlou Sa at [facility] today.  Essential hypertension: Blood pressure stable on lisinopril and atenolol.  Hyperlipidemia: On statin.  Leukocytosis: No evidence of acute infection.  Probably due to recent steroid use.   DVT prophylaxis: SCDs Start: 07/16/22 2323   Code Status: Full code Family Communication: None at the bedside Disposition Plan: Status is: Inpatient Remains inpatient appropriate because: Trauma.  Inpatient surgery planned.     Consultants:  Orthopedics  Procedures:  None  Antimicrobials:  None   Subjective: Patient seen and examined.  Still in the emergency room.  Pain is controlled with pain medication.  He is having difficulty urinating with his history of BPH, however he will be trying this. Denies any other symptoms.  Objective: Vitals:   07/17/22 0251 07/17/22 0630 07/17/22 0637 07/17/22 0700  BP: (!) 135/56 (!) 94/57  101/62  Pulse: (!) 56 (!) 52  (!) 54  Resp: 15 (!) 21  (!) 21  Temp: 97.7 F (36.5 C)  98.1 F (36.7 C)   TempSrc:   Oral   SpO2: 97% 96%  96%   No intake or output data in the 24 hours ending 07/17/22 0938 There were no vitals filed for this visit.  Examination:  General exam: Appears calm and comfortable  Respiratory system: Clear to auscultation.  Respiratory effort normal. Cardiovascular system: S1 & S2 heard, RRR.  Gastrointestinal system: Soft.  Nontender.  Bowel sound present. Central nervous system: Alert and oriented. No focal neurological deficits. Extremities: Deformity of the hip on the left, shortening of the left leg.  Distal pulses palpable.    Data Reviewed: I have personally reviewed following labs and imaging studies  CBC: Recent Labs  Lab 07/14/22 0857 07/16/22 2055  WBC 7.2 18.1*  NEUTROABS 5.7 16.4*  HGB 15.7 13.5  HCT 46.0 41.8  MCV 97.8 100.7*  PLT 169.0 623*   Basic Metabolic Panel: Recent Labs  Lab 07/14/22 0857 07/16/22 2055  NA 141 141  K 4.9 4.1  CL 103 106  CO2 32 28  GLUCOSE 118* 118*  BUN 16 20  CREATININE 0.83 0.75  CALCIUM 9.4 8.7*   GFR: Estimated Creatinine Clearance: 73.8 mL/min (by C-G formula based on SCr of 0.75 mg/dL). Liver Function Tests: Recent Labs  Lab 07/14/22 0857 07/16/22 2055  AST 16 21  ALT 15 18  ALKPHOS 46 39  BILITOT 1.9* 2.0*  PROT 7.2 6.5  ALBUMIN 4.3 3.6   No results for input(s): "LIPASE", "AMYLASE" in the last 168 hours. No results for input(s): "AMMONIA" in the last 168 hours. Coagulation Profile: Recent Labs  Lab 07/16/22 2055  INR 1.1   Cardiac Enzymes: No results for input(s): "CKTOTAL", "CKMB", "CKMBINDEX", "TROPONINI" in the last 168 hours. BNP (last 3 results) No results for input(s): "PROBNP" in the last 8760 hours. HbA1C: No results for input(s): "HGBA1C" in the last 72 hours.  CBG: No results for input(s): "GLUCAP" in the last 168 hours. Lipid Profile: No results for input(s): "CHOL", "HDL", "LDLCALC", "TRIG", "CHOLHDL", "LDLDIRECT" in the last 72 hours. Thyroid Function Tests: No results for input(s): "TSH", "T4TOTAL", "FREET4", "T3FREE", "THYROIDAB" in the last 72 hours. Anemia Panel: No results for input(s): "VITAMINB12", "FOLATE", "FERRITIN", "TIBC", "IRON", "RETICCTPCT" in the last 72 hours. Sepsis Labs: No results for  input(s): "PROCALCITON", "LATICACIDVEN" in the last 168 hours.  Recent Results (from the past 240 hour(s))  SARS Coronavirus 2 by RT PCR (hospital order, performed in [facility] hospital lab) *cepheid single result test* Anterior Nasal Swab     Status: None   Collection Time: 07/16/22  8:55 PM   Specimen: Anterior Nasal Swab  Result Value Ref Range Status   SARS Coronavirus 2 by RT PCR NEGATIVE NEGATIVE Final    Comment: (NOTE) SARS-CoV-2 target nucleic acids are NOT DETECTED.  The SARS-CoV-2 RNA is generally detectable in upper and lower respiratory specimens during the acute phase of infection. The lowest concentration of SARS-CoV-2 viral copies this assay can detect is 250 copies / mL. A negative result does not preclude SARS-CoV-2 infection and should not be used as the sole basis for treatment or other patient management decisions.  A negative result may occur with improper specimen collection / handling, submission of specimen other than nasopharyngeal swab, presence of viral mutation(s) within the areas targeted by this assay, and inadequate number of viral copies (<250 copies / mL). A negative result must be combined with clinical observations, patient history, and epidemiological information.  Fact Sheet for Patients:   https://www.patel.info/  Fact Sheet for Healthcare Providers: https://hall.com/  This test is not yet approved or  cleared by the [place] FDA and has been authorized for detection and/or diagnosis of SARS-CoV-2 by FDA under an Emergency Use Authorization (EUA).  This EUA will remain in effect (meaning this test can be used) for the duration of the COVID-19 declaration under Section 564(b)(1) of the Act, 21 U.S.C. section 360bbb-3(b)(1), unless the authorization is terminated or revoked sooner.  Performed at [facility], [place] [address], [place], [place] [address]           Radiology Studies: DG Chest 1 View  Result Date: 07/16/2022 CLINICAL DATA:  Fall from standing EXAM: CHEST  1 VIEW COMPARISON:  Radiographs 05/31/2012 FINDINGS: No displaced rib fractures. No pneumothorax or pleural effusion. Elevated right hemidiaphragm. Bibasilar atelectasis/consolidation. Biapical scarring. Postoperative changes right lower lung. Widening of the superior mediastinum is likely due to patient leftward rotation. IMPRESSION: No acute abnormality. Electronically Signed   By: Placido Sou M.D.   On: 07/16/2022 20:03   DG Pelvis 1-2 Views  Result Date: 07/16/2022 CLINICAL DATA:  Left hip pain after fall. EXAM: PELVIS - 1-2 VIEW; LEFT FEMUR 2 VIEWS COMPARISON:  None Available. FINDINGS: Acute left intertrochanteric femur fracture. No dislocation. Degenerative changes of the hips and left knee. Soft tissues are unremarkable. IMPRESSION: 1. Acute left intertrochanteric femur fracture. Electronically Signed   By: Titus Dubin M.D.   On: 07/16/2022 20:02   DG Femur Min 2 Views Left  Result Date: 07/16/2022 CLINICAL DATA:  Left hip pain after fall. EXAM: PELVIS - 1-2 VIEW; LEFT FEMUR 2 VIEWS COMPARISON:  None Available. FINDINGS: Acute left intertrochanteric femur fracture. No dislocation. Degenerative changes of the hips and left knee. Soft tissues are unremarkable. IMPRESSION: 1. Acute left intertrochanteric femur fracture. Electronically Signed   By: Titus Dubin M.D.   On: 07/16/2022  20:02        Scheduled Meds:  atenolol  100 mg Oral Daily   cyclobenzaprine  10 mg Oral QHS   fentaNYL       fentaNYL (SUBLIMAZE) injection  50-100 mcg Intravenous UD   lisinopril  40 mg Oral Daily   senna  1 tablet Oral BID   simvastatin  40 mg Oral QHS   Continuous Infusions:  lactated ringers 75 mL/hr at 07/17/22 0702     LOS: 1 day    Time spent: 35 minutes    Barb Merino, MD [facility] Pager [phone]

## 2022-07-17 NOTE — Progress Notes (Signed)
Left message with Rhina Brackett' his  daughter about the surgery

## 2022-07-17 NOTE — Anesthesia Preprocedure Evaluation (Addendum)
Anesthesia Evaluation  Patient identified by MRN, date of birth, ID bandGeneral Assessment Comment:History noted Dr. Nyoka Cowden  Airway Mallampati: II       Dental   Pulmonary shortness of breath, former smoker History noted Dr. Nyoka Cowden   breath sounds clear to auscultation       Cardiovascular hypertension,  Rhythm:Regular Rate:Normal     Neuro/Psych    GI/Hepatic negative GI ROS, Neg liver ROS,,,  Endo/Other  negative endocrine ROS    Renal/GU negative Renal ROS     Musculoskeletal   Abdominal   Peds  Hematology   Anesthesia Other Findings   Reproductive/Obstetrics                             Anesthesia Physical Anesthesia Plan  ASA: 3  Anesthesia Plan: General   Post-op Pain Management: Tylenol PO (pre-op)*   Induction: Intravenous  PONV Risk Score and Plan: 3 and Ondansetron and Dexamethasone  Airway Management Planned: Oral ETT  Additional Equipment:   Intra-op Plan:   Post-operative Plan: Possible Post-op intubation/ventilation  Informed Consent: I have reviewed the patients History and Physical, chart, labs and discussed the procedure including the risks, benefits and alternatives for the proposed anesthesia with the patient or authorized representative who has indicated his/her understanding and acceptance.     Dental advisory given  Plan Discussed with: CRNA and Anesthesiologist  Anesthesia Plan Comments:        Anesthesia Quick Evaluation

## 2022-07-18 ENCOUNTER — Inpatient Hospital Stay (HOSPITAL_COMMUNITY): Payer: Medicare HMO

## 2022-07-18 DIAGNOSIS — S72142A Displaced intertrochanteric fracture of left femur, initial encounter for closed fracture: Secondary | ICD-10-CM | POA: Diagnosis not present

## 2022-07-18 DIAGNOSIS — S72002A Fracture of unspecified part of neck of left femur, initial encounter for closed fracture: Secondary | ICD-10-CM

## 2022-07-18 LAB — GLUCOSE, CAPILLARY: Glucose-Capillary: 163 mg/dL — ABNORMAL HIGH (ref 70–99)

## 2022-07-18 NOTE — Progress Notes (Signed)
PROGRESS NOTE    John Riley  YIR:[id number] DOB: [birth year] DOA: 07/16/2022 PCP: Dorothyann Peng, NP    Brief Narrative:  88 year old with history of non-small cell lung cancer, prostate cancer and prostatism, hypertension , hyperlipidemia presented from home with mechanical fall.  In the emergency room hemodynamically stable.  He was found to have left hip fracture.  Patient was transferred to [facility] for ORIF and underwent intertrochanteric IM nailing 12/1 Dr. Marlou Sa.  Assessment & Plan:   Closed traumatic left intertrochanteric fracture with displacement: ORIF 12/1.  Surgically stable as per surgery. adequate IV and oral opiates for pain relief, opiates along with laxatives.  Weightbearing instructions as per surgery. DVT prophylaxis with aspirin 81 mg twice daily for 4 weeks.  Essential hypertension: Blood pressure stable on lisinopril and atenolol.  Hyperlipidemia: On statin.  Leukocytosis: Stress-induced leukocytosis.  Normalized.  Abnormal LFT: Patient has chronic liver function abnormalities and is stable.   DVT prophylaxis: SCDs Start: 07/17/22 2040 SCDs Start: 07/16/22 2323   Code Status: Full code Family Communication: Daughter and son-in-law at the bedside. Disposition Plan: Status is: Inpatient Remains inpatient appropriate because: Trauma.  Inpatient surgery planned.     Consultants:  Orthopedics  Procedures:  None  Antimicrobials:  None   Subjective: Patient seen and examined.  Family at the bedside.  Patient is very excited and wants to walk.  He tells me that it was not hurting all night but now there is some spasm and tightness in his muscles.  Denies any other symptoms.  He is eager to get out of the bed.  Objective: Vitals:   07/18/22 0428 07/18/22 0512 07/18/22 0854 07/18/22 1007  BP: 106/62  108/67 100/66  Pulse: 69  77   Resp: 16  18   Temp: 98 F (36.7 C)  98.4 F (36.9 C)   TempSrc: Oral  Oral   SpO2: 97%  97%   Weight:   92.1 kg    Height:  5' 10.75" (1.797 m)      Intake/Output Summary (Last 24 hours) at 07/18/2022 1341 Last data filed at 07/17/2022 1826 Gross per 24 hour  Intake 800 ml  Output 75 ml  Net 725 ml   Filed Weights   07/18/22 0512  Weight: 92.1 kg    Examination:  General exam: Appears calm and comfortable  Respiratory system: Clear to auscultation. Respiratory effort normal.  No added sounds. Cardiovascular system: S1 & S2 heard, RRR.  Gastrointestinal system: Soft.  Nontender.  Bowel sound present. Central nervous system: Alert and oriented. No focal neurological deficits. Extremities: Left hip lateral thigh incision clean and dry, intact.  Distal neurovascular status intact.    Data Reviewed: I have personally reviewed following labs and imaging studies  CBC: Recent Labs  Lab 07/14/22 0857 07/16/22 2055 07/17/22 1043 07/17/22 1125  WBC 7.2 18.1* 8.1 10.8*  NEUTROABS 5.7 16.4*  --  9.7*  HGB 15.7 13.5 12.1* 13.4  HCT 46.0 41.8 38.1* 41.8  MCV 97.8 100.7* 102.7* 101.0*  PLT 169.0 147* 118* 500*   Basic Metabolic Panel: Recent Labs  Lab 07/14/22 0857 07/16/22 2055 07/17/22 1043 07/17/22 1125  NA 141 141 141 140  K 4.9 4.1 2.9* 4.2  CL 103 106 112* 104  CO2 32 28 26 29   GLUCOSE 118* 118* 91 109*  BUN 16 20 15 19   CREATININE 0.83 0.75 0.62 0.79  CALCIUM 9.4 8.7* 6.4* 8.4*   GFR: Estimated Creatinine Clearance: 73.8 mL/min (by C-G formula based on  SCr of 0.79 mg/dL). Liver Function Tests: Recent Labs  Lab 07/14/22 0857 07/16/22 2055 07/17/22 1125  AST 16 21 21   ALT 15 18 17   ALKPHOS 46 39 36*  BILITOT 1.9* 2.0* 3.0*  PROT 7.2 6.5 6.1*  ALBUMIN 4.3 3.6 3.3*   No results for input(s): "LIPASE", "AMYLASE" in the last 168 hours. No results for input(s): "AMMONIA" in the last 168 hours. Coagulation Profile: Recent Labs  Lab 07/16/22 2055  INR 1.1   Cardiac Enzymes: No results for input(s): "CKTOTAL", "CKMB", "CKMBINDEX", "TROPONINI" in the last  168 hours. BNP (last 3 results) No results for input(s): "PROBNP" in the last 8760 hours. HbA1C: No results for input(s): "HGBA1C" in the last 72 hours. CBG: No results for input(s): "GLUCAP" in the last 168 hours. Lipid Profile: No results for input(s): "CHOL", "HDL", "LDLCALC", "TRIG", "CHOLHDL", "LDLDIRECT" in the last 72 hours. Thyroid Function Tests: No results for input(s): "TSH", "T4TOTAL", "FREET4", "T3FREE", "THYROIDAB" in the last 72 hours. Anemia Panel: No results for input(s): "VITAMINB12", "FOLATE", "FERRITIN", "TIBC", "IRON", "RETICCTPCT" in the last 72 hours. Sepsis Labs: No results for input(s): "PROCALCITON", "LATICACIDVEN" in the last 168 hours.  Recent Results (from the past 240 hour(s))  SARS Coronavirus 2 by RT PCR (hospital order, performed in [facility] hospital lab) *cepheid single result test* Anterior Nasal Swab     Status: None   Collection Time: 07/16/22  8:55 PM   Specimen: Anterior Nasal Swab  Result Value Ref Range Status   SARS Coronavirus 2 by RT PCR NEGATIVE NEGATIVE Final    Comment: (NOTE) SARS-CoV-2 target nucleic acids are NOT DETECTED.  The SARS-CoV-2 RNA is generally detectable in upper and lower respiratory specimens during the acute phase of infection. The lowest concentration of SARS-CoV-2 viral copies this assay can detect is 250 copies / mL. A negative result does not preclude SARS-CoV-2 infection and should not be used as the sole basis for treatment or other patient management decisions.  A negative result may occur with improper specimen collection / handling, submission of specimen other than nasopharyngeal swab, presence of viral mutation(s) within the areas targeted by this assay, and inadequate number of viral copies (<250 copies / mL). A negative result must be combined with clinical observations, patient history, and epidemiological information.  Fact Sheet for Patients:   https://www.patel.info/  Fact  Sheet for Healthcare Providers: https://hall.com/  This test is not yet approved or  cleared by the [place] FDA and has been authorized for detection and/or diagnosis of SARS-CoV-2 by FDA under an Emergency Use Authorization (EUA).  This EUA will remain in effect (meaning this test can be used) for the duration of the COVID-19 declaration under Section 564(b)(1) of the Act, 21 U.S.C. section 360bbb-3(b)(1), unless the authorization is terminated or revoked sooner.  Performed at [facility], [place] [address], [place], [place] [address]   Surgical pcr screen     Status: None   Collection Time: 07/17/22 10:28 AM   Specimen: Nasal Mucosa; Nasal Swab  Result Value Ref Range Status   MRSA, PCR NEGATIVE NEGATIVE Final   Staphylococcus aureus NEGATIVE NEGATIVE Final    Comment: (NOTE) The Xpert SA Assay (FDA approved for NASAL specimens in patients 97 years of age and older), is one component of a comprehensive surveillance program. It is not intended to diagnose infection nor to guide or monitor treatment. Performed at [facility], [place] [address], [place], [place] [address]  Radiology Studies: DG FEMUR MIN 2 VIEWS LEFT  Result Date: 07/18/2022 CLINICAL DATA:  ORIF femoral neck fracture EXAM: LEFT FEMUR 4  VIEWS COMPARISON:  07/16/2022. FINDINGS: There has been interval ORIF with an intramedullary rod and left femoral neck compression screws. Grossly anatomic alignment with approximately 2 cm displacement. IMPRESSION: Status post ORIF left femoral neck fracture. Electronically Signed   By: Sammie Bench M.D.   On: 07/18/2022 01:53   DG FEMUR MIN 2 VIEWS LEFT  Result Date: 07/17/2022 CLINICAL DATA:  Intramedullary nail. EXAM: LEFT FEMUR 2 VIEWS COMPARISON:  Left femur x-ray 07/16/2022 FINDINGS: Intraoperative left hip. Three low resolution intraoperative spot views of the left hip were obtained. There is a  new left femoral intramedullary nail and hip screw fixating intratrochanteric fracture. Alignment is anatomic. Total fluoroscopy time: 74.8 seconds Total radiation dose: 4.12 micro Gy IMPRESSION: Intraoperative left femoral intramedullary nail and hip screw fixating intratrochanteric fracture. Electronically Signed   By: Ronney Asters M.D.   On: 07/17/2022 19:44   DG C-Arm 1-60 Min-No Report  Result Date: 07/17/2022 Fluoroscopy was utilized by the requesting physician.  No radiographic interpretation.   DG Chest 1 View  Result Date: 07/16/2022 CLINICAL DATA:  Fall from standing EXAM: CHEST  1 VIEW COMPARISON:  Radiographs 05/31/2012 FINDINGS: No displaced rib fractures. No pneumothorax or pleural effusion. Elevated right hemidiaphragm. Bibasilar atelectasis/consolidation. Biapical scarring. Postoperative changes right lower lung. Widening of the superior mediastinum is likely due to patient leftward rotation. IMPRESSION: No acute abnormality. Electronically Signed   By: Placido Sou M.D.   On: 07/16/2022 20:03   DG Pelvis 1-2 Views  Result Date: 07/16/2022 CLINICAL DATA:  Left hip pain after fall. EXAM: PELVIS - 1-2 VIEW; LEFT FEMUR 2 VIEWS COMPARISON:  None Available. FINDINGS: Acute left intertrochanteric femur fracture. No dislocation. Degenerative changes of the hips and left knee. Soft tissues are unremarkable. IMPRESSION: 1. Acute left intertrochanteric femur fracture. Electronically Signed   By: Titus Dubin M.D.   On: 07/16/2022 20:02   DG Femur Min 2 Views Left  Result Date: 07/16/2022 CLINICAL DATA:  Left hip pain after fall. EXAM: PELVIS - 1-2 VIEW; LEFT FEMUR 2 VIEWS COMPARISON:  None Available. FINDINGS: Acute left intertrochanteric femur fracture. No dislocation. Degenerative changes of the hips and left knee. Soft tissues are unremarkable. IMPRESSION: 1. Acute left intertrochanteric femur fracture. Electronically Signed   By: Titus Dubin M.D.   On: 07/16/2022 20:02         Scheduled Meds:  acetaminophen  500 mg Oral Q6H   aspirin EC  81 mg Oral BID   atenolol  100 mg Oral Daily   cyclobenzaprine  10 mg Oral QHS   docusate sodium  100 mg Oral BID   lisinopril  40 mg Oral Daily   senna  1 tablet Oral BID   simvastatin  40 mg Oral QHS   Continuous Infusions:  lactated ringers Stopped (07/18/22 0547)   methocarbamol (ROBAXIN) IV       LOS: 2 days    Time spent: 35 minutes    Barb Merino, MD [facility] Pager [phone]

## 2022-07-18 NOTE — Plan of Care (Signed)

## 2022-07-18 NOTE — Evaluation (Signed)
Physical Therapy Evaluation Patient Details Name: John Riley MRN: [id number] DOB: [birth year] Today's Date: 07/18/2022  History of Present Illness  Pt is a 88 y.o. male admitted s/p fall with subsequent left intertrochanteric femur fracture.  Underwent ORIF LLE 07/17/22.  PMH significant of Non-small cell lung cancer, prostate cancer, hypertension.  Clinical Impression  Patient presents with dependencies in gait and transfers.  Currently, patient is unable to maintain TDWB and thus is not safe for ambulation.  At this time, patient cannot maneuver safely in his home, thus recommending SNF.  Long term, I would also recommend patient move to a 1st floor apt as he has been having difficulty maneuvering the stairs for a while.  Pt will benefit from continued PT to progress mobility and strength.         Recommendations for follow up therapy are one component of a multi-disciplinary discharge planning process, led by the attending physician.  Recommendations may be updated based on patient status, additional functional criteria and insurance authorization.  Follow Up Recommendations Skilled nursing-short term rehab (<3 hours/day) Can patient physically be transported by private vehicle: No    Assistance Recommended at Discharge Frequent or constant Supervision/Assistance  Patient can return home with the following  A lot of help with walking and/or transfers;A lot of help with bathing/dressing/bathroom;Help with stairs or ramp for entrance;Assist for transportation    Equipment Recommendations Rolling walker (2 wheels);BSC/3in1  Recommendations for Other Services       Functional Status Assessment Patient has had a recent decline in their functional status and demonstrates the ability to make significant improvements in function in a reasonable and predictable amount of time.     Precautions / Restrictions Precautions Precautions: Fall Restrictions Weight Bearing Restrictions: Yes LLE  Weight Bearing: Touchdown weight bearing      Mobility  Bed Mobility Overal bed mobility: Needs Assistance Bed Mobility: Sidelying to Sit   Sidelying to sit: Min assist       General bed mobility comments: tactile and verbal cues to sequencing    Transfers Overall transfer level: Needs assistance Equipment used: Rolling walker (2 wheels) Transfers: Sit to/from Stand, Bed to chair/wheelchair/BSC Sit to Stand: Mod assist, +2 physical assistance   Step pivot transfers: Mod assist, +2 physical assistance       General transfer comment: patient unable to maintain TDWB during transfer even with max verbal and tactile cueing.  Assist for balance and sequencing.    Ambulation/Gait                  Stairs            Wheelchair Mobility    Modified Rankin (Stroke Patients Only)       Balance Overall balance assessment: Needs assistance Sitting-balance support: No upper extremity supported, Feet supported Sitting balance-Leahy Scale: Fair     Standing balance support: Bilateral upper extremity supported, During functional activity, Reliant on assistive device for balance Standing balance-Leahy Scale: Poor Standing balance comment: patient very unsteady with RW during transfer                             Pertinent Vitals/Pain Pain Assessment Pain Assessment: Faces Faces Pain Scale: Hurts little more Pain Location: left hip Pain Descriptors / Indicators: Discomfort, Grimacing, Guarding Pain Intervention(s): Limited activity within patient's tolerance, Monitored during session    Home Living Family/patient expects to be discharged to:: Private residence Living Arrangements: Alone   Type  of Home: Apartment Home Access: Stairs to enter   [employer] of Steps: on 2nd level - full flight of stairs, no elevator   Home Layout: One level Home Equipment: Cane - single point      Prior Function Prior Level of Function :  Independent/Modified Independent                     Hand Dominance        Extremity/Trunk Assessment        Lower Extremity Assessment Lower Extremity Assessment: Generalized weakness;LLE deficits/detail LLE: Unable to fully assess due to pain    Cervical / Trunk Assessment Cervical / Trunk Assessment: Kyphotic  Communication   Communication: Expressive difficulties (has a congenital stutter)  Cognition Arousal/Alertness: Awake/alert Behavior During Therapy: WFL for tasks assessed/performed Overall Cognitive Status: Within Functional Limits for tasks assessed                                 General Comments: I have concerns about patient's safety awareness - insists on living in 2nd floor apt and able to express that he has had difficulty getting up stairs for over a year and refuses to move to 1st floor apt.  This is his baseline.        General Comments      Exercises     Assessment/Plan    PT Assessment Patient needs continued PT services  PT Problem List Decreased strength;Decreased activity tolerance;Decreased balance;Decreased mobility;Decreased knowledge of use of DME;Decreased safety awareness       PT Treatment Interventions Gait training;DME instruction;Functional mobility training;Therapeutic activities;Therapeutic exercise;Balance training;Patient/family education    PT Goals (Current goals can be found in the Care Plan section)  Acute Rehab PT Goals Patient Stated Goal: go back home PT Goal Formulation: With patient/family Time For Goal Achievement: 08/01/22 Potential to Achieve Goals: Fair    Frequency Min 3X/week     Co-evaluation               AM-PAC PT "6 Clicks" Mobility  Outcome Measure Help needed turning from your back to your side while in a flat bed without using bedrails?: A Little Help needed moving from lying on your back to sitting on the side of a flat bed without using bedrails?: A Little Help  needed moving to and from a bed to a chair (including a wheelchair)?: A Lot Help needed standing up from a chair using your arms (e.g., wheelchair or bedside chair)?: A Lot Help needed to walk in hospital room?: A Lot Help needed climbing 3-5 steps with a railing? : Total 6 Click Score: 13    End of Session Equipment Utilized During Treatment: Gait belt Activity Tolerance: Patient tolerated treatment well Patient left: in chair;with chair alarm set;with family/visitor present Nurse Communication: Mobility status;Need for lift equipment PT Visit Diagnosis: Unsteadiness on feet (R26.81);Other abnormalities of gait and mobility (R26.89);History of falling (Z91.81);Muscle weakness (generalized) (M62.81)    Time: 1025-1100 PT Time Calculation (min) (ACUTE ONLY): 35 min   Charges:   PT Evaluation $PT Eval Moderate Complexity: 1 Mod PT Treatments $Therapeutic Activity: 8-22 mins        07/18/2022 Margie, PT Acute Rehabilitation Services Office:  [phone]   Shanna Cisco 07/18/2022, 12:29 PM

## 2022-07-19 DIAGNOSIS — S72002D Fracture of unspecified part of neck of left femur, subsequent encounter for closed fracture with routine healing: Secondary | ICD-10-CM

## 2022-07-19 DIAGNOSIS — E785 Hyperlipidemia, unspecified: Secondary | ICD-10-CM | POA: Diagnosis not present

## 2022-07-19 DIAGNOSIS — I10 Essential (primary) hypertension: Secondary | ICD-10-CM | POA: Diagnosis not present

## 2022-07-19 LAB — COMPREHENSIVE METABOLIC PANEL
ALT: 17 U/L (ref 0–44)
AST: 25 U/L (ref 15–41)
Albumin: 2.6 g/dL — ABNORMAL LOW (ref 3.5–5.0)
Alkaline Phosphatase: 34 U/L — ABNORMAL LOW (ref 38–126)
Anion gap: 7 (ref 5–15)
BUN: 14 mg/dL (ref 8–23)
CO2: 29 mmol/L (ref 22–32)
Calcium: 7.9 mg/dL — ABNORMAL LOW (ref 8.9–10.3)
Chloride: 102 mmol/L (ref 98–111)
Creatinine, Ser: 0.72 mg/dL (ref 0.61–1.24)
GFR, Estimated: >60 mL/min (ref >60)
Glucose, Bld: 132 mg/dL — ABNORMAL HIGH (ref 70–99)
Potassium: 3.9 mmol/L (ref 3.5–5.1)
Sodium: 138 mmol/L (ref 135–145)
Total Bilirubin: 1.3 mg/dL — ABNORMAL HIGH (ref 0.3–1.2)
Total Protein: 5 g/dL — ABNORMAL LOW (ref 6.5–8.1)

## 2022-07-19 LAB — CBC WITH DIFFERENTIAL/PLATELET
Abs Immature Granulocytes: 0.07 10*3/uL (ref 0.00–0.07)
Basophils Absolute: 0 10*3/uL (ref 0.0–0.1)
Basophils Relative: 0 %
Eosinophils Absolute: 0 10*3/uL (ref 0.0–0.5)
Eosinophils Relative: 0 %
HCT: 28.4 % — ABNORMAL LOW (ref 39.0–52.0)
Hemoglobin: 9.7 g/dL — ABNORMAL LOW (ref 13.0–17.0)
Immature Granulocytes: 1 %
Lymphocytes Relative: 5 %
Lymphs Abs: 0.5 10*3/uL — ABNORMAL LOW (ref 0.7–4.0)
MCH: 33 pg (ref 26.0–34.0)
MCHC: 34.2 g/dL (ref 30.0–36.0)
MCV: 96.6 fL (ref 80.0–100.0)
Monocytes Absolute: 1 10*3/uL (ref 0.1–1.0)
Monocytes Relative: 9 %
Neutro Abs: 9.4 10*3/uL — ABNORMAL HIGH (ref 1.7–7.7)
Neutrophils Relative %: 85 %
Platelets: 103 10*3/uL — ABNORMAL LOW (ref 150–400)
RBC: 2.94 MIL/uL — ABNORMAL LOW (ref 4.22–5.81)
RDW: 11.6 % (ref 11.5–15.5)
WBC: 11 10*3/uL — ABNORMAL HIGH (ref 4.0–10.5)
nRBC: 0 % (ref 0.0–0.2)

## 2022-07-19 LAB — PHOSPHORUS: Phosphorus: 2.3 mg/dL — ABNORMAL LOW (ref 2.5–4.6)

## 2022-07-19 LAB — MAGNESIUM: Magnesium: 2 mg/dL (ref 1.7–2.4)

## 2022-07-19 MED ORDER — BISACODYL 10 MG RE SUPP
10.0000 mg | Freq: Once | RECTAL | Status: AC
  Administered 2022-07-19: 10 mg via RECTAL
  Filled 2022-07-19: qty 1

## 2022-07-19 NOTE — Plan of Care (Signed)

## 2022-07-19 NOTE — TOC Initial Note (Signed)
Transition of Care [facility]) - Initial/Assessment Note    Patient Details  Name: John Riley MRN: [id number] Date of Birth: Apr 24, 1932  [facility]) CM/SW Contact:    [name], LCSW Phone Number: 07/19/2022, 10:01 AM  Clinical Narrative:                  CSW received SNF consult. CSW met with pt and daughter [name] at bedside. CSW introduced self and explained role at the hospital. Pt reports that PTA he lived at home alone. Pt was independent with mobility and ADL's. Pts daughter added that pt has had difficulty with walking and managing the stairs to get to his apartment.  CSW reviewed PT/OT recommendations for SNF. Pt reports that he is fine with going to SNF. Pt gave CSW permission to fax out to facilities in the area. Pt has no preference of facility at this time. Pt does request a private room and understands this might be an extra fee. CSW gave pt medicare.gov rating list to review. CSW explained insurance auth process.  CSW will continue to follow.   Expected Discharge Plan: Skilled Nursing Facility Barriers to Discharge: Ship broker, Continued Medical Work up, SNF Pending bed offer   Patient Goals and CMS Choice Patient states their goals for this hospitalization and ongoing recovery are:: to go to SNF CMS Medicare.gov Compare Post Acute Care list provided to:: Patient Represenative (must comment) Choice offered to / list presented to : Patient, Adult Children  Expected Discharge Plan and Services Expected Discharge Plan: [place]       Living arrangements for the past 2 months: Apartment                                      Prior Living Arrangements/Services Living arrangements for the past 2 months: Apartment Lives with:: Self Patient language and need for interpreter reviewed:: Yes Do you feel safe going back to the place where you live?: Yes      Need for Family Participation in Patient Care: Yes  (Comment) Care giver support system in place?: Yes (comment) Current home services: DME Criminal Activity/Legal Involvement Pertinent to Current Situation/Hospitalization: No - Comment as needed  Activities of Daily Living Home Assistive Devices/Equipment: None ADL Screening (condition at time of admission) Patient's cognitive ability adequate to safely complete daily activities?: Yes Is the patient deaf or have difficulty hearing?: No Does the patient have difficulty seeing, even when wearing glasses/contacts?: No Does the patient have difficulty concentrating, remembering, or making decisions?: No Patient able to express need for assistance with ADLs?: Yes Does the patient have difficulty dressing or bathing?: No Independently performs ADLs?: Yes (appropriate for developmental age) Does the patient have difficulty walking or climbing stairs?: No Weakness of Legs: Both Weakness of Arms/Hands: None  Permission Sought/Granted Permission sought to share information with : Facility Sport and exercise psychologist, Family Supports Permission granted to share information with : Yes, Verbal Permission Granted     Permission granted to share info w AGENCY: SNF        Emotional Assessment Appearance:: Appears stated age Attitude/Demeanor/Rapport: Engaged Affect (typically observed): Appropriate Orientation: : Oriented to Self, Oriented to Place, Oriented to  Time, Oriented to Situation Alcohol / Substance Use: Not Applicable Psych Involvement: No (comment)  Admission diagnosis:  Femur fracture, left (HCC) [S72.92XA] Closed fracture of left hip, initial encounter [facility]) [S72.002A] Patient Active Problem List  Diagnosis Date Noted   Closed fracture of left hip ([place]) 07/18/2022   Femur fracture, left ([place]) 07/16/2022   Cervical pain 07/16/2022   Impaired glucose tolerance 02/09/2014   Dyspnea 12/13/2012   Prostate cancer ([place]) 08/05/2012   Non-small cell lung cancer, Rt middle Lobe     INSOMNIA 12/07/2007   Dyslipidemia 06/28/2007   Essential hypertension 06/28/2007   PCP:  Dorothyann Peng, NP Pharmacy:   [facility] [address] [place] - [address] [place] [address] Phone: [phone] Fax: [phone]     Social Determinants of Health (SDOH) Interventions    Readmission Risk Interventions     No data to display         [name], [place] Social Worker

## 2022-07-19 NOTE — Progress Notes (Signed)
[facility]  PROGRESS NOTE  John Riley TIR:[id number] DOB: [birth year] DOA: 07/16/2022 PCP: Dorothyann Peng, NP   Brief HPI:   88 year old with history of non-small cell lung cancer, prostate cancer and prostatism, hypertension , hyperlipidemia presented from home with mechanical fall.  In the emergency room hemodynamically stable.  He was found to have left hip fracture.  Patient was transferred to [facility] for ORIF and underwent intertrochanteric IM nailing 12/1 Dr. Marlou Sa     Subjective   Patient seen and examined, complains of left lower quadrant pain.  Has not had BM for 3 days.   Assessment/Plan:    Close traumatic left intertrochanter fracture with displacement -S/p ORIF on 12/1 -Pain well controlled -Plan to go to skilled nursing facility for rehab -DVT prophylaxis 81 mg p.o. twice daily for 4 weeks  Hypertension -Blood pressure is stable -Continue atenolol, lisinopril  Hyperlipidemia -Continue statin  Leukocytosis -Likely reactive -Resolved  Abnormal LFTs -Chronic abnormalities of LFTs -Stable    Medications     aspirin EC  81 mg Oral BID   atenolol  100 mg Oral Daily   cyclobenzaprine  10 mg Oral QHS   docusate sodium  100 mg Oral BID   lisinopril  40 mg Oral Daily   senna  1 tablet Oral BID   simvastatin  40 mg Oral QHS     Data Reviewed:   CBG:  Recent Labs  Lab 07/18/22 2129  GLUCAP 163*    SpO2: 97 % O2 Flow Rate (L/min): 2 L/min    Vitals:   07/18/22 1553 07/18/22 1935 07/18/22 2110 07/19/22 0923  BP: 104/76 122/75 129/67 113/70  Pulse: 76 75 75   Resp: (!) 21 (!) 22 20   Temp: 98.7 F (37.1 C)     TempSrc: Oral     SpO2: 95% 95% 97%   Weight:      Height:          Data Reviewed:  Basic Metabolic Panel: Recent Labs  Lab 07/14/22 0857 07/16/22 2055 07/17/22 1043 07/17/22 1125 07/19/22 0202  NA 141 141 141 140 138  K 4.9 4.1 2.9* 4.2 3.9  CL 103 106 112* 104 102  CO2 32 28 26 29 29   GLUCOSE 118*  118* 91 109* 132*  BUN 16 20 15 19 14   CREATININE 0.83 0.75 0.62 0.79 0.72  CALCIUM 9.4 8.7* 6.4* 8.4* 7.9*  MG  --   --   --   --  2.0  PHOS  --   --   --   --  2.3*    CBC: Recent Labs  Lab 07/14/22 0857 07/16/22 2055 07/17/22 1043 07/17/22 1125 07/19/22 0202  WBC 7.2 18.1* 8.1 10.8* 11.0*  NEUTROABS 5.7 16.4*  --  9.7* 9.4*  HGB 15.7 13.5 12.1* 13.4 9.7*  HCT 46.0 41.8 38.1* 41.8 28.4*  MCV 97.8 100.7* 102.7* 101.0* 96.6  PLT 169.0 147* 118* 128* 103*    LFT Recent Labs  Lab 07/14/22 0857 07/16/22 2055 07/17/22 1125 07/19/22 0202  AST 16 21 21 25   ALT 15 18 17 17   ALKPHOS 46 39 36* 34*  BILITOT 1.9* 2.0* 3.0* 1.3*  PROT 7.2 6.5 6.1* 5.0*  ALBUMIN 4.3 3.6 3.3* 2.6*     Antibiotics: Anti-infectives (From admission, onward)    Start     Dose/Rate Route Frequency Ordered Stop   07/18/22 0600  ceFAZolin (ANCEF) IVPB 2g/100 mL premix        2 g 200 mL/hr  over 30 Minutes Intravenous On call to O.R. 07/17/22 1449 07/17/22 1640   07/17/22 2200  ceFAZolin (ANCEF) IVPB 2g/100 mL premix        2 g 200 mL/hr over 30 Minutes Intravenous Every 8 hours 07/17/22 2039 07/18/22 0616   07/17/22 1453  ceFAZolin (ANCEF) 2-4 GM/100ML-% IVPB       Note to Pharmacy: [place] Mo E: cabinet override      07/17/22 1453 07/17/22 1702        DVT prophylaxis: Aspirin 81 mg p.o. twice daily for 4 weeks  Code Status: Full code  Family Communication: No family at bedside   CONSULTS orthopedics   Objective    Physical Examination:   General-appears in no acute distress Heart-S1-S2, regular, no murmur auscultated Lungs-clear to auscultation bilaterally, no wheezing or crackles auscultated Abdomen-soft, mild tenderness in left lower quadrant Extremities-no edema in the lower extremities Neuro-alert, oriented x3, no focal deficit noted  Status is: Inpatient:             Oswald Hillock   [facility] If 7PM-7AM, please contact night-coverage at  [email or web address], Office  [phone]   07/19/2022, 4:02 PM  LOS: 3 days

## 2022-07-19 NOTE — Plan of Care (Signed)
  Problem: Education: Goal: Knowledge of General Education information will improve Description: Including pain rating scale, medication(s)/side effects and non-pharmacologic comfort measures Outcome: Not Progressing   Problem: Health Behavior/Discharge Planning: Goal: Ability to manage health-related needs will improve Outcome: Not Progressing   Problem: Clinical Measurements: Goal: Ability to maintain clinical measurements within normal limits will improve Outcome: Not Progressing Goal: Will remain free from infection Outcome: Not Progressing Goal: Diagnostic test results will improve Outcome: Not Progressing Goal: Respiratory complications will improve Outcome: Not Progressing Goal: Cardiovascular complication will be avoided Outcome: Not Progressing   Problem: Coping: Goal: Level of anxiety will decrease Outcome: Not Progressing   Problem: Elimination: Goal: Will not experience complications related to bowel motility Outcome: Not Progressing Goal: Will not experience complications related to urinary retention Outcome: Not Progressing

## 2022-07-19 NOTE — NC FL2 (Signed)
[place] MEDICAID FL2 LEVEL OF CARE FORM     IDENTIFICATION  Patient Name: John Riley Birthdate: 1931-10-17 Sex: male Admission Date (Current Location): 07/16/2022  [facility] and [place] Number:  Herbalist and Address:  [facility], [place] [address], [place], [place] [address]      Provider Number: [id number]  Attending Physician Name and Address:  Oswald Hillock, MD  Relative Name and Phone Number:       Current Level of Care: Hospital Recommended Level of Care: [place] Prior Approval Number:    Date Approved/Denied:   PASRR Number: [id number]  Discharge Plan: SNF    Current Diagnoses: Patient Active Problem List   Diagnosis Date Noted   Closed fracture of left hip ([place]) 07/18/2022   Femur fracture, left ([place]) 07/16/2022   Cervical pain 07/16/2022   Impaired glucose tolerance 02/09/2014   Dyspnea 12/13/2012   Prostate cancer ([place]) 08/05/2012   Non-small cell lung cancer, Rt middle Lobe    INSOMNIA 12/07/2007   Dyslipidemia 06/28/2007   Essential hypertension 06/28/2007    Orientation RESPIRATION BLADDER Height & Weight     Self, Time, Situation, Place  Normal Continent Weight: 203 lb (92.1 kg) Height:  5' 10.75" (179.7 cm)  BEHAVIORAL SYMPTOMS/MOOD NEUROLOGICAL BOWEL NUTRITION STATUS      Continent Diet (See DC summary)  AMBULATORY STATUS COMMUNICATION OF NEEDS Skin   Extensive Assist Verbally Normal, Surgical wounds                       Personal Care Assistance Level of Assistance  Bathing, Feeding, Dressing Bathing Assistance: Limited assistance Feeding assistance: Independent Dressing Assistance: Limited assistance     Functional Limitations Info  Sight, Hearing, Speech Sight Info: Adequate Hearing Info: Adequate Speech Info: Adequate    SPECIAL CARE FACTORS FREQUENCY  PT (By licensed PT), OT (By licensed OT)     PT Frequency: 5x a week OT Frequency: 5x a week             Contractures Contractures Info: Not present    Additional Factors Info  Code Status, Allergies Code Status Info: Full Allergies Info: Niacin           Current Medications (07/19/2022):  This is the current hospital active medication list Current Facility-Administered Medications  Medication Dose Route Frequency Provider Last Rate Last Admin   acetaminophen (TYLENOL) tablet 325-650 mg  325-650 mg Oral Q6H PRN Magnant, Gerrianne Scale, PA-C       aspirin EC tablet 81 mg  81 mg Oral BID Magnant, Margarito L, PA-C   81 mg at 07/19/22 0923   atenolol (TENORMIN) tablet 100 mg  100 mg Oral Daily Tu, Ching T, DO   100 mg at 07/19/22 3235   cyclobenzaprine (FLEXERIL) tablet 10 mg  10 mg Oral QHS Tu, Ching T, DO   10 mg at 07/18/22 2105   docusate sodium (COLACE) capsule 100 mg  100 mg Oral BID Magnant, Dilan L, PA-C   100 mg at 07/19/22 5732   HYDROcodone-acetaminophen (NORCO/VICODIN) 5-325 MG per tablet 1-2 tablet  1-2 tablet Oral Q4H PRN Magnant, Brenda L, PA-C   2 tablet at 07/18/22 2104   lactated ringers infusion   Intravenous Continuous Regalado, Belkys A, MD   Stopped at 07/18/22 0547   lisinopril (ZESTRIL) tablet 40 mg  40 mg Oral Daily Tu, Ching T, DO   40 mg at 07/19/22 2025   menthol-cetylpyridinium (CEPACOL)  lozenge 3 mg  1 lozenge Oral PRN Magnant, Verland L, PA-C       Or   phenol (CHLORASEPTIC) mouth spray 1 spray  1 spray Mouth/Throat PRN Magnant, Mccrae L, PA-C       methocarbamol (ROBAXIN) tablet 500 mg  500 mg Oral Q6H PRN Magnant, Alvie L, PA-C       Or   methocarbamol (ROBAXIN) 500 mg in dextrose 5 % 50 mL IVPB  500 mg Intravenous Q6H PRN Magnant, Fady L, PA-C       metoCLOPramide (REGLAN) tablet 5-10 mg  5-10 mg Oral Q8H PRN Magnant, Demitrious L, PA-C       Or   metoCLOPramide (REGLAN) injection 5-10 mg  5-10 mg Intravenous Q8H PRN Magnant, Herve L, PA-C       morphine (PF) 2 MG/ML injection 0.5 mg  0.5 mg Intravenous Q2H PRN Magnant, Billie L, PA-C        ondansetron (ZOFRAN) tablet 4 mg  4 mg Oral Q6H PRN Magnant, Brennen L, PA-C       Or   ondansetron (ZOFRAN) injection 4 mg  4 mg Intravenous Q6H PRN Magnant, Clarence L, PA-C       senna (SENOKOT) tablet 8.6 mg  1 tablet Oral BID Tu, Ching T, DO   8.6 mg at 07/19/22 5993   simvastatin (ZOCOR) tablet 40 mg  40 mg Oral QHS Tu, Ching T, DO   40 mg at 07/18/22 2105     Discharge Medications: Please see discharge summary for a list of discharge medications.  Relevant Imaging Results:  Relevant Lab Results:   Additional Information SSN [id number]  John Riley, [place]

## 2022-07-20 ENCOUNTER — Inpatient Hospital Stay (HOSPITAL_COMMUNITY): Payer: Medicare HMO

## 2022-07-20 ENCOUNTER — Encounter (HOSPITAL_COMMUNITY): Payer: Self-pay | Admitting: Orthopedic Surgery

## 2022-07-20 DIAGNOSIS — S72002A Fracture of unspecified part of neck of left femur, initial encounter for closed fracture: Secondary | ICD-10-CM | POA: Diagnosis not present

## 2022-07-20 MED ORDER — SODIUM PHOSPHATES 45 MMOLE/15ML IV SOLN
15.0000 mmol | Freq: Once | INTRAVENOUS | Status: AC
  Administered 2022-07-20: 15 mmol via INTRAVENOUS
  Filled 2022-07-20: qty 5

## 2022-07-20 NOTE — TOC Progression Note (Addendum)
Transition of Care [facility]) - Progression Note    Patient Details  Name: John Riley MRN: [id number] Date of Birth: [birth year]  [facility] CM/SW Contact  Joanne Chars, LCSW Phone Number: 07/20/2022, 2:38 PM  Clinical Narrative:   CSW spoke with [name] about bed offers.  She asked for responses from [facility], [facility], and [facility].   [facility] does make bed offer.  No response [facility] yet.  [facility] unsure on bed availability currently.  1430: TC daughter John Riley.  Discussed the above.  She wants to accept offer at [facility].  Pt will stay with her after rehab before returning to his apartment.  They are working on getting him to a first floor apartment now.    [facility] informed.    Auth request submitted in [place] and approved: [id number], 3 days: 12/5-12/7.   Expected Discharge Plan: Skilled Nursing Facility Barriers to Discharge: Ship broker, Continued Medical Work up, SNF Pending bed offer  Expected Discharge Plan and Services Expected Discharge Plan: [place] arrangements for the past 2 months: Apartment                                       Social Determinants of Health (SDOH) Interventions    Readmission Risk Interventions     No data to display

## 2022-07-20 NOTE — Care Management Important Message (Signed)
Important Message  Patient Details  Name: John Riley MRN: [id number] Date of Birth: 1931/10/07   Medicare Important Message Given:  Yes     Hannah Beat 07/20/2022, 1:07 PM

## 2022-07-20 NOTE — TOC Progression Note (Signed)
Transition of Care [facility]) - Progression Note    Patient Details  Name: John Riley MRN: [id number] Date of Birth: April 27, 1932  [facility]) CM/SW Contact  Bartholomew Crews, RN Phone Number: [phone] 07/20/2022, 5:03 PM  Clinical Narrative:     Nursing requesting letter for MD signature with recommendation for patient to be moved to ground floor apartment. Letter sent to nurses station printer.   Expected Discharge Plan: Skilled Nursing Facility Barriers to Discharge: Ship broker, Continued Medical Work up, SNF Pending bed offer  Expected Discharge Plan and Services Expected Discharge Plan: [place] arrangements for the past 2 months: Apartment                                       Social Determinants of Health (SDOH) Interventions    Readmission Risk Interventions     No data to display

## 2022-07-20 NOTE — Progress Notes (Signed)
PROGRESS NOTE    John Riley  CNO:[id number] DOB: 07-04-1932 DOA: 07/16/2022 PCP: Dorothyann Peng, NP   Brief Narrative: 88 year old with past medical history significant for non-small cell lung cancer, prostate cancer BPH, hypertension, hyperlipidemia presented from home after mechanical fall patient was found to have left hip fracture.  Patient underwent ORIF intertrochanteric IM nailing 12/1 by Dr. Marlou Sa.    Assessment & Plan:   Principal Problem:   Femur fracture, left (HCC) Active Problems:   Dyslipidemia   Essential hypertension   Cervical pain   Closed fracture of left hip (HCC)   1-Closed traumatic left intertrochanteric fracture with displacement: -Patient underwent left hip intertrochanteric fracture open reduction and internal fixation internal nail and 1 large screw by Dr. Marlou Sa. -PT per Ortho  -DVT prophylaxis per ortho , awaiting recommendation.    Hypertension: -Continue with Lisinopril, atenolol.  Acute hypoxic Resp Failure; desat last night. Placed on 2 L oxygen.  Chest x ray: No active disease. Previous pulmonary resection at the right base. Chronic scarring and elevation of the right hemidiaphragm.  Hyperlipidemia: Continue with Zocor  Hypophosphatemia: Replete  Leukocytosis: Reactive.  Trending down  Transaminases: Resolved.  History of Non small cell  lung cancer status postresection  Estimated body mass index is 28.51 kg/m as calculated from the following:   Height as of this encounter: 5' 10.75" (1.797 m).   Weight as of this encounter: 92.1 kg.   DVT prophylaxis: Aspirin Code Status: Full code Family Communication: No family At bedside Disposition Plan:  Status is: Inpatient Remains inpatient appropriate because: rehab awaiting,     Consultants:  Ortho  Procedures:  Patient underwent left hip intertrochanteric fracture open reduction and internal fixation internal nail and 1 large screw by Dr. Marlou Sa.  Antimicrobials:     Subjective: He is alert, denies dyspnea. Last night oxygen drop, he was placed on 3 L.   Objective: Vitals:   07/18/22 1935 07/18/22 2110 07/19/22 0923 07/19/22 2134  BP: 122/75 129/67 113/70 (!) 92/52  Pulse: 75 75  64  Resp: (!) 22 20  16   Temp:    97.8 F (36.6 C)  TempSrc:    Oral  SpO2: 95% 97%  94%  Weight:      Height:        Intake/Output Summary (Last 24 hours) at 07/20/2022 0809 Last data filed at 07/19/2022 2135 Gross per 24 hour  Intake 240 ml  Output 251 ml  Net -11 ml   Filed Weights   07/18/22 0512  Weight: 92.1 kg    Examination:  General exam: Appears calm and comfortable  Respiratory system: Clear to auscultation. Respiratory effort normal. Cardiovascular system: S1 & S2 heard, RRR. Gastrointestinal system: Abdomen is nondistended, soft and nontender. No organomegaly or masses felt. Normal bowel sounds heard. Central nervous system: Alert and oriented. No focal neurological deficits. Extremities: Symmetric 5 x 5 power.    Data Reviewed: I have personally reviewed following labs and imaging studies  CBC: Recent Labs  Lab 07/14/22 0857 07/16/22 2055 07/17/22 1043 07/17/22 1125 07/19/22 0202  WBC 7.2 18.1* 8.1 10.8* 11.0*  NEUTROABS 5.7 16.4*  --  9.7* 9.4*  HGB 15.7 13.5 12.1* 13.4 9.7*  HCT 46.0 41.8 38.1* 41.8 28.4*  MCV 97.8 100.7* 102.7* 101.0* 96.6  PLT 169.0 147* 118* 128* 294*   Basic Metabolic Panel: Recent Labs  Lab 07/14/22 0857 07/16/22 2055 07/17/22 1043 07/17/22 1125 07/19/22 0202  NA 141 141 141 140 138  K 4.9 4.1 2.9*  4.2 3.9  CL 103 106 112* 104 102  CO2 32 28 26 29 29   GLUCOSE 118* 118* 91 109* 132*  BUN 16 20 15 19 14   CREATININE 0.83 0.75 0.62 0.79 0.72  CALCIUM 9.4 8.7* 6.4* 8.4* 7.9*  MG  --   --   --   --  2.0  PHOS  --   --   --   --  2.3*   GFR: Estimated Creatinine Clearance: 73.8 mL/min (by C-G formula based on SCr of 0.72 mg/dL). Liver Function Tests: Recent Labs  Lab 07/14/22 0857  07/16/22 2055 07/17/22 1125 07/19/22 0202  AST 16 21 21 25   ALT 15 18 17 17   ALKPHOS 46 39 36* 34*  BILITOT 1.9* 2.0* 3.0* 1.3*  PROT 7.2 6.5 6.1* 5.0*  ALBUMIN 4.3 3.6 3.3* 2.6*   No results for input(s): "LIPASE", "AMYLASE" in the last 168 hours. No results for input(s): "AMMONIA" in the last 168 hours. Coagulation Profile: Recent Labs  Lab 07/16/22 2055  INR 1.1   Cardiac Enzymes: No results for input(s): "CKTOTAL", "CKMB", "CKMBINDEX", "TROPONINI" in the last 168 hours. BNP (last 3 results) No results for input(s): "PROBNP" in the last 8760 hours. HbA1C: No results for input(s): "HGBA1C" in the last 72 hours. CBG: Recent Labs  Lab 07/18/22 2129  GLUCAP 163*   Lipid Profile: No results for input(s): "CHOL", "HDL", "LDLCALC", "TRIG", "CHOLHDL", "LDLDIRECT" in the last 72 hours. Thyroid Function Tests: No results for input(s): "TSH", "T4TOTAL", "FREET4", "T3FREE", "THYROIDAB" in the last 72 hours. Anemia Panel: No results for input(s): "VITAMINB12", "FOLATE", "FERRITIN", "TIBC", "IRON", "RETICCTPCT" in the last 72 hours. Sepsis Labs: No results for input(s): "PROCALCITON", "LATICACIDVEN" in the last 168 hours.  Recent Results (from the past 240 hour(s))  SARS Coronavirus 2 by RT PCR (hospital order, performed in [facility] hospital lab) *cepheid single result test* Anterior Nasal Swab     Status: None   Collection Time: 07/16/22  8:55 PM   Specimen: Anterior Nasal Swab  Result Value Ref Range Status   SARS Coronavirus 2 by RT PCR NEGATIVE NEGATIVE Final    Comment: (NOTE) SARS-CoV-2 target nucleic acids are NOT DETECTED.  The SARS-CoV-2 RNA is generally detectable in upper and lower respiratory specimens during the acute phase of infection. The lowest concentration of SARS-CoV-2 viral copies this assay can detect is 250 copies / mL. A negative result does not preclude SARS-CoV-2 infection and should not be used as the sole basis for treatment or other patient  management decisions.  A negative result may occur with improper specimen collection / handling, submission of specimen other than nasopharyngeal swab, presence of viral mutation(s) within the areas targeted by this assay, and inadequate number of viral copies (<250 copies / mL). A negative result must be combined with clinical observations, patient history, and epidemiological information.  Fact Sheet for Patients:   https://www.patel.info/  Fact Sheet for Healthcare Providers: https://hall.com/  This test is not yet approved or  cleared by the [place] FDA and has been authorized for detection and/or diagnosis of SARS-CoV-2 by FDA under an Emergency Use Authorization (EUA).  This EUA will remain in effect (meaning this test can be used) for the duration of the COVID-19 declaration under Section 564(b)(1) of the Act, 21 U.S.C. section 360bbb-3(b)(1), unless the authorization is terminated or revoked sooner.  Performed at [facility], [place] [address] [place], [place] [address]   Surgical pcr screen     Status: None  Collection Time: 07/17/22 10:28 AM   Specimen: Nasal Mucosa; Nasal Swab  Result Value Ref Range Status   MRSA, PCR NEGATIVE NEGATIVE Final   Staphylococcus aureus NEGATIVE NEGATIVE Final    Comment: (NOTE) The Xpert SA Assay (FDA approved for NASAL specimens in patients 69 years of age and older), is one component of a comprehensive surveillance program. It is not intended to diagnose infection nor to guide or monitor treatment. Performed at [facility], [place] [address], [place],  [address]          Radiology Studies: No results found.      Scheduled Meds:  aspirin EC  81 mg Oral BID   atenolol  100 mg Oral Daily   cyclobenzaprine  10 mg Oral QHS   docusate sodium  100 mg Oral BID   lisinopril  40 mg Oral Daily   senna  1 tablet Oral BID   simvastatin  40  mg Oral QHS   Continuous Infusions:  lactated ringers Stopped (07/18/22 0547)   methocarbamol (ROBAXIN) IV     sodium phosphate 15 mmol in dextrose 5 % 250 mL infusion       LOS: 4 days    Time spent: 35 minutes    Yoshio Seliga A Curren Mohrmann, MD [facility]   If 7PM-7AM, please contact night-coverage [email or web address]  07/20/2022, 8:09 AM

## 2022-07-20 NOTE — Progress Notes (Signed)
Patient stable.  Pain controlled. Minimal pain with left leg range of motion which is encouraging Patient is able to lift the leg up with his own strength Plan at this time is discharged to skilled nursing. I can happen whenever he is medically ready.  Aspirin 81 mg twice a day for DVT prophylaxis and Norco for pain  Follow-up with [place] in 2 weeks  Partial weightbearing for transfers until follow-up appointment and then we will allow him to do weightbearing as tolerated

## 2022-07-20 NOTE — Plan of Care (Signed)

## 2022-07-21 DIAGNOSIS — Z7401 Bed confinement status: Secondary | ICD-10-CM | POA: Diagnosis not present

## 2022-07-21 DIAGNOSIS — R0989 Other specified symptoms and signs involving the circulatory and respiratory systems: Secondary | ICD-10-CM | POA: Diagnosis not present

## 2022-07-21 DIAGNOSIS — Z85118 Personal history of other malignant neoplasm of bronchus and lung: Secondary | ICD-10-CM | POA: Diagnosis not present

## 2022-07-21 DIAGNOSIS — E46 Unspecified protein-calorie malnutrition: Secondary | ICD-10-CM | POA: Diagnosis not present

## 2022-07-21 DIAGNOSIS — S72145D Nondisplaced intertrochanteric fracture of left femur, subsequent encounter for closed fracture with routine healing: Secondary | ICD-10-CM | POA: Diagnosis not present

## 2022-07-21 DIAGNOSIS — S72002A Fracture of unspecified part of neck of left femur, initial encounter for closed fracture: Secondary | ICD-10-CM | POA: Diagnosis not present

## 2022-07-21 DIAGNOSIS — Z8546 Personal history of malignant neoplasm of prostate: Secondary | ICD-10-CM | POA: Diagnosis not present

## 2022-07-21 DIAGNOSIS — W19XXXA Unspecified fall, initial encounter: Secondary | ICD-10-CM | POA: Diagnosis not present

## 2022-07-21 DIAGNOSIS — R059 Cough, unspecified: Secondary | ICD-10-CM | POA: Diagnosis not present

## 2022-07-21 DIAGNOSIS — R109 Unspecified abdominal pain: Secondary | ICD-10-CM | POA: Diagnosis not present

## 2022-07-21 DIAGNOSIS — Z79899 Other long term (current) drug therapy: Secondary | ICD-10-CM | POA: Diagnosis not present

## 2022-07-21 DIAGNOSIS — K5900 Constipation, unspecified: Secondary | ICD-10-CM | POA: Diagnosis not present

## 2022-07-21 DIAGNOSIS — R531 Weakness: Secondary | ICD-10-CM | POA: Diagnosis not present

## 2022-07-21 LAB — CBC
HCT: 30.9 % — ABNORMAL LOW (ref 39.0–52.0)
Hemoglobin: 10.4 g/dL — ABNORMAL LOW (ref 13.0–17.0)
MCH: 33.2 pg (ref 26.0–34.0)
MCHC: 33.7 g/dL (ref 30.0–36.0)
MCV: 98.7 fL (ref 80.0–100.0)
Platelets: 164 10*3/uL (ref 150–400)
RBC: 3.13 MIL/uL — ABNORMAL LOW (ref 4.22–5.81)
RDW: 11.7 % (ref 11.5–15.5)
WBC: 11.7 10*3/uL — ABNORMAL HIGH (ref 4.0–10.5)
nRBC: 0 % (ref 0.0–0.2)

## 2022-07-21 LAB — BASIC METABOLIC PANEL
Anion gap: 8 (ref 5–15)
BUN: 18 mg/dL (ref 8–23)
CO2: 27 mmol/L (ref 22–32)
Calcium: 8.1 mg/dL — ABNORMAL LOW (ref 8.9–10.3)
Chloride: 104 mmol/L (ref 98–111)
Creatinine, Ser: 0.79 mg/dL (ref 0.61–1.24)
GFR, Estimated: >60 mL/min (ref >60)
Glucose, Bld: 123 mg/dL — ABNORMAL HIGH (ref 70–99)
Potassium: 4.1 mmol/L (ref 3.5–5.1)
Sodium: 139 mmol/L (ref 135–145)

## 2022-07-21 LAB — PHOSPHORUS: Phosphorus: 2.9 mg/dL (ref 2.5–4.6)

## 2022-07-21 MED ORDER — DOCUSATE SODIUM 100 MG PO CAPS: 100.0000 mg | ORAL_CAPSULE | Freq: Two times a day (BID) | ORAL | 0 refills | Status: DC

## 2022-07-21 MED ORDER — HYDROCODONE-ACETAMINOPHEN 5-325 MG PO TABS: 1.0000 | ORAL_TABLET | ORAL | 0 refills | Status: AC | PRN

## 2022-07-21 MED ORDER — SENNA 8.6 MG PO TABS: 1.0000 | ORAL_TABLET | Freq: Two times a day (BID) | ORAL | 0 refills | Status: DC

## 2022-07-21 MED ORDER — ASPIRIN 81 MG PO TBEC: 81.0000 mg | DELAYED_RELEASE_TABLET | Freq: Two times a day (BID) | ORAL | 12 refills | Status: DC

## 2022-07-21 NOTE — TOC Transition Note (Signed)
Transition of Care [facility]) - CM/SW Discharge Note   Patient Details  Name: John Riley MRN: [id number] Date of Birth: December 04, 1931  [facility]) CM/SW Contact:  Joanne Chars, LCSW Phone Number: 07/21/2022, 11:42 AM   Clinical Narrative:   Pt discharging to [facility] room 202.  RN call report to [phone].      Final next level of care: Skilled Nursing Facility Barriers to Discharge: Barriers Resolved   Patient Goals and CMS Choice Patient states their goals for this hospitalization and ongoing recovery are:: to go to SNF CMS Medicare.gov Compare Post Acute Care list provided to:: Patient Represenative (must comment) Choice offered to / list presented to : Patient, Adult Children  Discharge Placement              Patient chooses bed at:  ([facility]) Patient to be transferred to facility by: [place] Name of family member notified: daughter [name] Patient and family notified of of transfer: 07/21/22  Discharge Plan and Services                                     Social Determinants of Health (SDOH) Interventions     Readmission Risk Interventions     No data to display

## 2022-07-21 NOTE — Discharge Summary (Signed)
Physician Discharge Summary   Patient: John Riley MRN: [id number] DOB: 27-Jul-1932  Admit date:     07/16/2022  Discharge date: 07/21/22  Discharge Physician: Elmarie Shiley   PCP: Dorothyann Peng, NP   Recommendations at discharge:   Needs to follow up with Dr Marlou Sa in 2 weeks.  Needs Aspirin for DVT prophylaxis.  Continue with oxygen supplementation, wean off as tolerated.   Discharge Diagnoses: Principal Problem:   Femur fracture, left ([place]) Active Problems:   Dyslipidemia   Essential hypertension   Cervical pain   Closed fracture of left hip (HCC)  Resolved Problems:   * No resolved hospital problems. [facility]: 88 year old with past medicall history significant for non-small cell lung cancer, prostate cancer BPH, hypertension, hyperlipidemia presented from home after mechanical fall patient was found to have left hip fracture.  Patient underwent ORIF intertrochanteric IM nailing 12/1 by Dr. Marlou Sa.    Assessment and Plan: 1-Closed traumatic left intertrochanteric fracture with displacement: -Patient underwent left hip intertrochanteric fracture open reduction and internal fixation internal nail and 1 large screw by Dr. Marlou Sa. -PT per Ortho  -On aspirin BID for DVT prophylaxis.  -Follow up with Dr Marlou Sa in 2 weeks post op.  -Partial weightbearing for transfers until follow-up appointment with Ortho     Hypertension: -Continue with Lisinopril, atenolol.   Acute hypoxic Resp Failure; desat last night. Placed on 2 L oxygen.  Chest x ray: No active disease. Previous pulmonary resection at the right base. Chronic scarring and elevation of the right hemidiaphragm. He has prior history of lung resection. Suspect combination of hypoventilation, related to pain and pain medications.   Hyperlipidemia: Continue with Zocor   Hypophosphatemia: Replete   Leukocytosis: Reactive.  Trending down   Transaminases: Resolved.   History of Non small cell  lung cancer  status postresection          Consultants: Dr Marlou Sa Procedures performed:   Patient underwent left hip intertrochanteric fracture open reduction and internal fixation internal nail and 1 large screw by Dr. Marlou Sa. Disposition: Skilled nursing facility Diet recommendation:  Discharge Diet Orders (From admission, onward)     Start     Ordered   07/21/22 0000  Diet - low sodium heart healthy        07/21/22 0918           Cardiac diet DISCHARGE MEDICATION: Allergies as of 07/21/2022       Reactions   Niacin         Medication List     TAKE these medications    acetaminophen 500 MG tablet Commonly known as: TYLENOL Take 1,000 mg by mouth every 6 (six) hours as needed for mild pain.   aspirin EC 81 MG tablet Take 1 tablet (81 mg total) by mouth 2 (two) times daily. Swallow whole.   atenolol 100 MG tablet Commonly known as: TENORMIN Take 1 tablet (100 mg total) by mouth daily.   cyclobenzaprine 10 MG tablet Commonly known as: FLEXERIL Take 1 tablet (10 mg total) by mouth at bedtime.   docusate sodium 100 MG capsule Commonly known as: COLACE Take 1 capsule (100 mg total) by mouth 2 (two) times daily.   guaifenesin 100 MG/5ML syrup Commonly known as: ROBITUSSIN Take 200 mg by mouth 3 (three) times daily as needed for cough.   HYDROcodone-acetaminophen 5-325 MG tablet Commonly known as: NORCO/VICODIN Take 1-2 tablets by mouth every 4 (four) hours as needed for up to 3 days for moderate  pain (pain score 4-6).   lisinopril 40 MG tablet Commonly known as: ZESTRIL Take 1 tablet (40 mg total) by mouth daily.   senna 8.6 MG Tabs tablet Commonly known as: SENOKOT Take 1 tablet (8.6 mg total) by mouth 2 (two) times daily.   simvastatin 40 MG tablet Commonly known as: ZOCOR Take 1 tablet (40 mg total) by mouth at bedtime.        Discharge Exam: Filed Weights   07/18/22 0512  Weight: 92.1 kg   General; NAD  Condition at discharge: stable  The results  of significant diagnostics from this hospitalization (including imaging, microbiology, ancillary and laboratory) are listed below for reference.   Imaging Studies: DG CHEST PORT 1 VIEW  Result Date: 07/20/2022 CLINICAL DATA:  Hypoxia EXAM: PORTABLE CHEST 1 VIEW COMPARISON:  07/16/2022 FINDINGS: Heart size upper limits of normal. Tortuous aorta as seen previously. The left lung remains clear. Previous pulmonary resection at the right base with chronic scarring and elevation of the hemidiaphragm. No sign acute infiltrate, edema or effusion. No abnormal bone finding. IMPRESSION: No active disease. Previous pulmonary resection at the right base. Chronic scarring and elevation of the right hemidiaphragm. Electronically Signed   By: Nelson Chimes M.D.   On: 07/20/2022 13:36   DG FEMUR MIN 2 VIEWS LEFT  Result Date: 07/18/2022 CLINICAL DATA:  ORIF femoral neck fracture EXAM: LEFT FEMUR 4  VIEWS COMPARISON:  07/16/2022. FINDINGS: There has been interval ORIF with an intramedullary rod and left femoral neck compression screws. Grossly anatomic alignment with approximately 2 cm displacement. IMPRESSION: Status post ORIF left femoral neck fracture. Electronically Signed   By: Sammie Bench M.D.   On: 07/18/2022 01:53   DG FEMUR MIN 2 VIEWS LEFT  Result Date: 07/17/2022 CLINICAL DATA:  Intramedullary nail. EXAM: LEFT FEMUR 2 VIEWS COMPARISON:  Left femur x-ray 07/16/2022 FINDINGS: Intraoperative left hip. Three low resolution intraoperative spot views of the left hip were obtained. There is a new left femoral intramedullary nail and hip screw fixating intratrochanteric fracture. Alignment is anatomic. Total fluoroscopy time: 74.8 seconds Total radiation dose: 4.12 micro Gy IMPRESSION: Intraoperative left femoral intramedullary nail and hip screw fixating intratrochanteric fracture. Electronically Signed   By: Ronney Asters M.D.   On: 07/17/2022 19:44   DG C-Arm 1-60 Min-No Report  Result Date:  07/17/2022 Fluoroscopy was utilized by the requesting physician.  No radiographic interpretation.   DG Chest 1 View  Result Date: 07/16/2022 CLINICAL DATA:  Fall from standing EXAM: CHEST  1 VIEW COMPARISON:  Radiographs 05/31/2012 FINDINGS: No displaced rib fractures. No pneumothorax or pleural effusion. Elevated right hemidiaphragm. Bibasilar atelectasis/consolidation. Biapical scarring. Postoperative changes right lower lung. Widening of the superior mediastinum is likely due to patient leftward rotation. IMPRESSION: No acute abnormality. Electronically Signed   By: Placido Sou M.D.   On: 07/16/2022 20:03   DG Pelvis 1-2 Views  Result Date: 07/16/2022 CLINICAL DATA:  Left hip pain after fall. EXAM: PELVIS - 1-2 VIEW; LEFT FEMUR 2 VIEWS COMPARISON:  None Available. FINDINGS: Acute left intertrochanteric femur fracture. No dislocation. Degenerative changes of the hips and left knee. Soft tissues are unremarkable. IMPRESSION: 1. Acute left intertrochanteric femur fracture. Electronically Signed   By: Titus Dubin M.D.   On: 07/16/2022 20:02   DG Femur Min 2 Views Left  Result Date: 07/16/2022 CLINICAL DATA:  Left hip pain after fall. EXAM: PELVIS - 1-2 VIEW; LEFT FEMUR 2 VIEWS COMPARISON:  None Available. FINDINGS: Acute left intertrochanteric femur fracture. No  dislocation. Degenerative changes of the hips and left knee. Soft tissues are unremarkable. IMPRESSION: 1. Acute left intertrochanteric femur fracture. Electronically Signed   By: Titus Dubin M.D.   On: 07/16/2022 20:02    Microbiology: Results for orders placed or performed during the hospital encounter of 07/16/22  SARS Coronavirus 2 by RT PCR (hospital order, performed in [facility] hospital lab) *cepheid single result test* Anterior Nasal Swab     Status: None   Collection Time: 07/16/22  8:55 PM   Specimen: Anterior Nasal Swab  Result Value Ref Range Status   SARS Coronavirus 2 by RT PCR NEGATIVE NEGATIVE Final     Comment: (NOTE) SARS-CoV-2 target nucleic acids are NOT DETECTED.  The SARS-CoV-2 RNA is generally detectable in upper and lower respiratory specimens during the acute phase of infection. The lowest concentration of SARS-CoV-2 viral copies this assay can detect is 250 copies / mL. A negative result does not preclude SARS-CoV-2 infection and should not be used as the sole basis for treatment or other patient management decisions.  A negative result may occur with improper specimen collection / handling, submission of specimen other than nasopharyngeal swab, presence of viral mutation(s) within the areas targeted by this assay, and inadequate number of viral copies (<250 copies / mL). A negative result must be combined with clinical observations, patient history, and epidemiological information.  Fact Sheet for Patients:   https://www.patel.info/  Fact Sheet for Healthcare Providers: https://hall.com/  This test is not yet approved or  cleared by the [place] FDA and has been authorized for detection and/or diagnosis of SARS-CoV-2 by FDA under an Emergency Use Authorization (EUA).  This EUA will remain in effect (meaning this test can be used) for the duration of the COVID-19 declaration under Section 564(b)(1) of the Act, 21 U.S.C. section 360bbb-3(b)(1), unless the authorization is terminated or revoked sooner.  Performed at [facility], [place] [address], [place], [place] [address]   Surgical pcr screen     Status: None   Collection Time: 07/17/22 10:28 AM   Specimen: Nasal Mucosa; Nasal Swab  Result Value Ref Range Status   MRSA, PCR NEGATIVE NEGATIVE Final   Staphylococcus aureus NEGATIVE NEGATIVE Final    Comment: (NOTE) The Xpert SA Assay (FDA approved for NASAL specimens in patients 45 years of age and older), is one component of a comprehensive surveillance program. It is not intended to diagnose infection  nor to guide or monitor treatment. Performed at [facility], [place] [address] [place], [place] [address]     Labs: CBC: Recent Labs  Lab 07/16/22 2055 07/17/22 1043 07/17/22 1125 07/19/22 0202 07/21/22 0402  WBC 18.1* 8.1 10.8* 11.0* 11.7*  NEUTROABS 16.4*  --  9.7* 9.4*  --   HGB 13.5 12.1* 13.4 9.7* 10.4*  HCT 41.8 38.1* 41.8 28.4* 30.9*  MCV 100.7* 102.7* 101.0* 96.6 98.7  PLT 147* 118* 128* 103* 151   Basic Metabolic Panel: Recent Labs  Lab 07/16/22 2055 07/17/22 1043 07/17/22 1125 07/19/22 0202 07/21/22 0402  NA 141 141 140 138 139  K 4.1 2.9* 4.2 3.9 4.1  CL 106 112* 104 102 104  CO2 28 26 29 29 27   GLUCOSE 118* 91 109* 132* 123*  BUN 20 15 19 14 18   CREATININE 0.75 0.62 0.79 0.72 0.79  CALCIUM 8.7* 6.4* 8.4* 7.9* 8.1*  MG  --   --   --  2.0  --   PHOS  --   --   --  2.3* 2.9   Liver Function Tests: Recent Labs  Lab 07/16/22 2055 07/17/22 1125 07/19/22 0202  AST 21 21 25   ALT 18 17 17   ALKPHOS 39 36* 34*  BILITOT 2.0* 3.0* 1.3*  PROT 6.5 6.1* 5.0*  ALBUMIN 3.6 3.3* 2.6*   CBG: Recent Labs  Lab 07/18/22 2129  GLUCAP 163*    Discharge time spent: greater than 30 minutes.  Signed: Elmarie Shiley, MD [facility] 07/21/2022

## 2022-07-21 NOTE — Progress Notes (Signed)
Mobility Specialist Progress Note   07/21/22 1130  Mobility  Activity Transferred from bed to chair  Level of Assistance +2 (takes two people) (ModA)  Assistive Device Front wheel walker  Distance Ambulated (ft) 2 ft  LLE Weight Bearing TWB  Activity Response Tolerated fair  $Mobility charge 1 Mobility   RN requesting assistance after finding pt's legs partially hanging out the bed. Moved pt to chair after complaints of not wanting to be in bed. +2A for physical assistance d/t limited LLE strength and lack of adherence to WB precautions. Increased time needed for safety but able to get to chair w/o fault. Left w/ call bell in reach, chair alarm on and needs met.     [place]land Falling Mobility Specialist Please contact via SecureChat or  Rehab office at [phone]

## 2022-07-21 NOTE — Plan of Care (Signed)

## 2022-07-21 NOTE — TOC Progression Note (Addendum)
Transition of Care [facility]) - Progression Note    Patient Details  Name: John Riley MRN: [id number] Date of Birth: 16-Apr-1932  [facility] CM/SW Contact  Joanne Chars, LCSW Phone Number: 07/21/2022, 8:39 AM  Clinical Narrative:  Auth approved in [place]: [id number], [id number], 3 days: 12/5-12/7.  MD informed.     CSW confirmed with [facility] that they can receive pt today.     Expected Discharge Plan: Skilled Nursing Facility Barriers to Discharge: Ship broker, Continued Medical Work up, SNF Pending bed offer  Expected Discharge Plan and Services Expected Discharge Plan: [place] arrangements for the past 2 months: Apartment                                       Social Determinants of Health (SDOH) Interventions    Readmission Risk Interventions     No data to display

## 2022-07-21 NOTE — Plan of Care (Signed)
Report called and given to [facility] at [facility]. Patient is stable. Daughter aware of patient transferring. No questions. Will continue to monitor patient until DC. IV removed.   Problem: Education: Goal: Knowledge of General Education information will improve Description: Including pain rating scale, medication(s)/side effects and non-pharmacologic comfort measures 07/21/2022 1212 by Trixie Deis, RN Outcome: Adequate for Discharge 07/21/2022 0807 by Trixie Deis, RN Outcome: Progressing   Problem: Health Behavior/Discharge Planning: Goal: Ability to manage health-related needs will improve Outcome: Adequate for Discharge   Problem: Clinical Measurements: Goal: Ability to maintain clinical measurements within normal limits will improve Outcome: Adequate for Discharge Goal: Will remain free from infection Outcome: Adequate for Discharge Goal: Diagnostic test results will improve Outcome: Adequate for Discharge Goal: Respiratory complications will improve Outcome: Adequate for Discharge Goal: Cardiovascular complication will be avoided Outcome: Adequate for Discharge   Problem: Activity: Goal: Risk for activity intolerance will decrease 07/21/2022 1212 by Trixie Deis, RN Outcome: Adequate for Discharge 07/21/2022 0807 by Trixie Deis, RN Outcome: Progressing   Problem: Nutrition: Goal: Adequate nutrition will be maintained Outcome: Adequate for Discharge   Problem: Coping: Goal: Level of anxiety will decrease Outcome: Adequate for Discharge   Problem: Elimination: Goal: Will not experience complications related to bowel motility Outcome: Adequate for Discharge Goal: Will not experience complications related to urinary retention Outcome: Adequate for Discharge   Problem: Pain Managment: Goal: General experience of comfort will improve 07/21/2022 1212 by Trixie Deis, RN Outcome: Adequate for Discharge 07/21/2022 0807 by Trixie Deis, RN Outcome:  Progressing   Problem: Safety: Goal: Ability to remain free from injury will improve 07/21/2022 1212 by Trixie Deis, RN Outcome: Adequate for Discharge 07/21/2022 0807 by Trixie Deis, RN Outcome: Progressing   Problem: Skin Integrity: Goal: Risk for impaired skin integrity will decrease 07/21/2022 1212 by Trixie Deis, RN Outcome: Adequate for Discharge 07/21/2022 0807 by Trixie Deis, RN Outcome: Progressing   Problem: Education: Goal: Verbalization of understanding the information provided (i.e., activity precautions, restrictions, etc) will improve Outcome: Adequate for Discharge Goal: Individualized Educational Video(s) Outcome: Adequate for Discharge   Problem: Activity: Goal: Ability to ambulate and perform ADLs will improve Outcome: Adequate for Discharge   Problem: Clinical Measurements: Goal: Postoperative complications will be avoided or minimized Outcome: Adequate for Discharge   Problem: Self-Concept: Goal: Ability to maintain and perform role responsibilities to the fullest extent possible will improve Outcome: Adequate for Discharge   Problem: Pain Management: Goal: Pain level will decrease Outcome: Adequate for Discharge   Problem: Acute Rehab PT Goals(only PT should resolve) Goal: Pt Will Go Supine/Side To Sit Outcome: Adequate for Discharge Goal: Pt Will Go Sit To Supine/Side Outcome: Adequate for Discharge Goal: Patient Will Transfer Sit To/From Stand Outcome: Adequate for Discharge Goal: Pt Will Transfer Bed To Chair/Chair To Bed Outcome: Adequate for Discharge Goal: Pt Will Ambulate Outcome: Adequate for Discharge Goal: Pt/caregiver will Perform Home Exercise Program Outcome: Adequate for Discharge

## 2022-07-22 DIAGNOSIS — Z79899 Other long term (current) drug therapy: Secondary | ICD-10-CM | POA: Diagnosis not present

## 2022-07-25 DIAGNOSIS — E46 Unspecified protein-calorie malnutrition: Secondary | ICD-10-CM | POA: Diagnosis not present

## 2022-07-25 DIAGNOSIS — R059 Cough, unspecified: Secondary | ICD-10-CM | POA: Diagnosis not present

## 2022-07-25 DIAGNOSIS — K5900 Constipation, unspecified: Secondary | ICD-10-CM | POA: Diagnosis not present

## 2022-07-25 DIAGNOSIS — Z8546 Personal history of malignant neoplasm of prostate: Secondary | ICD-10-CM | POA: Diagnosis not present

## 2022-07-25 DIAGNOSIS — S72002A Fracture of unspecified part of neck of left femur, initial encounter for closed fracture: Secondary | ICD-10-CM | POA: Diagnosis not present

## 2022-07-25 DIAGNOSIS — Z85118 Personal history of other malignant neoplasm of bronchus and lung: Secondary | ICD-10-CM | POA: Diagnosis not present

## 2022-07-28 ENCOUNTER — Emergency Department (HOSPITAL_COMMUNITY): Payer: Medicare HMO

## 2022-07-28 ENCOUNTER — Other Ambulatory Visit: Payer: Self-pay

## 2022-07-28 ENCOUNTER — Inpatient Hospital Stay (HOSPITAL_COMMUNITY)
Admission: EM | Admit: 2022-07-28 | Discharge: 2022-08-03 | DRG: 682 | Disposition: A | Discharge status: Skilled Nursing Facility | Payer: Medicare HMO | Source: Skilled Nursing Facility | Attending: Internal Medicine | Admitting: Internal Medicine

## 2022-07-28 ENCOUNTER — Encounter (HOSPITAL_COMMUNITY): Payer: Self-pay

## 2022-07-28 DIAGNOSIS — Z6829 Body mass index (BMI) 29.0-29.9, adult: Secondary | ICD-10-CM

## 2022-07-28 DIAGNOSIS — Y731 Therapeutic (nonsurgical) and rehabilitative gastroenterology and urology devices associated with adverse incidents: Secondary | ICD-10-CM | POA: Diagnosis not present

## 2022-07-28 DIAGNOSIS — Z7401 Bed confinement status: Secondary | ICD-10-CM | POA: Diagnosis not present

## 2022-07-28 DIAGNOSIS — S72145A Nondisplaced intertrochanteric fracture of left femur, initial encounter for closed fracture: Secondary | ICD-10-CM | POA: Diagnosis not present

## 2022-07-28 DIAGNOSIS — I951 Orthostatic hypotension: Secondary | ICD-10-CM | POA: Diagnosis present

## 2022-07-28 DIAGNOSIS — Z66 Do not resuscitate: Secondary | ICD-10-CM | POA: Diagnosis not present

## 2022-07-28 DIAGNOSIS — J9811 Atelectasis: Secondary | ICD-10-CM | POA: Diagnosis present

## 2022-07-28 DIAGNOSIS — D62 Acute posthemorrhagic anemia: Secondary | ICD-10-CM | POA: Diagnosis not present

## 2022-07-28 DIAGNOSIS — D72829 Elevated white blood cell count, unspecified: Secondary | ICD-10-CM | POA: Diagnosis not present

## 2022-07-28 DIAGNOSIS — S72142D Displaced intertrochanteric fracture of left femur, subsequent encounter for closed fracture with routine healing: Secondary | ICD-10-CM | POA: Diagnosis not present

## 2022-07-28 DIAGNOSIS — C61 Malignant neoplasm of prostate: Secondary | ICD-10-CM | POA: Diagnosis present

## 2022-07-28 DIAGNOSIS — Z87891 Personal history of nicotine dependence: Secondary | ICD-10-CM

## 2022-07-28 DIAGNOSIS — E875 Hyperkalemia: Secondary | ICD-10-CM | POA: Insufficient documentation

## 2022-07-28 DIAGNOSIS — R609 Edema, unspecified: Secondary | ICD-10-CM | POA: Diagnosis not present

## 2022-07-28 DIAGNOSIS — E870 Hyperosmolality and hypernatremia: Secondary | ICD-10-CM | POA: Diagnosis present

## 2022-07-28 DIAGNOSIS — R338 Other retention of urine: Secondary | ICD-10-CM

## 2022-07-28 DIAGNOSIS — N179 Acute kidney failure, unspecified: Secondary | ICD-10-CM | POA: Diagnosis not present

## 2022-07-28 DIAGNOSIS — E872 Acidosis, unspecified: Secondary | ICD-10-CM | POA: Diagnosis present

## 2022-07-28 DIAGNOSIS — R3589 Other polyuria: Secondary | ICD-10-CM | POA: Diagnosis present

## 2022-07-28 DIAGNOSIS — R0902 Hypoxemia: Secondary | ICD-10-CM | POA: Diagnosis not present

## 2022-07-28 DIAGNOSIS — I10 Essential (primary) hypertension: Secondary | ICD-10-CM | POA: Diagnosis not present

## 2022-07-28 DIAGNOSIS — N320 Bladder-neck obstruction: Secondary | ICD-10-CM | POA: Diagnosis present

## 2022-07-28 DIAGNOSIS — E878 Other disorders of electrolyte and fluid balance, not elsewhere classified: Secondary | ICD-10-CM | POA: Diagnosis present

## 2022-07-28 DIAGNOSIS — Z7982 Long term (current) use of aspirin: Secondary | ICD-10-CM

## 2022-07-28 DIAGNOSIS — R404 Transient alteration of awareness: Secondary | ICD-10-CM | POA: Diagnosis not present

## 2022-07-28 DIAGNOSIS — N281 Cyst of kidney, acquired: Secondary | ICD-10-CM | POA: Diagnosis not present

## 2022-07-28 DIAGNOSIS — I959 Hypotension, unspecified: Secondary | ICD-10-CM | POA: Diagnosis not present

## 2022-07-28 DIAGNOSIS — G9341 Metabolic encephalopathy: Secondary | ICD-10-CM | POA: Diagnosis not present

## 2022-07-28 DIAGNOSIS — R319 Hematuria, unspecified: Secondary | ICD-10-CM | POA: Diagnosis present

## 2022-07-28 DIAGNOSIS — N3289 Other specified disorders of bladder: Secondary | ICD-10-CM | POA: Diagnosis not present

## 2022-07-28 DIAGNOSIS — E785 Hyperlipidemia, unspecified: Secondary | ICD-10-CM | POA: Diagnosis present

## 2022-07-28 DIAGNOSIS — R9431 Abnormal electrocardiogram [ECG] [EKG]: Secondary | ICD-10-CM | POA: Diagnosis not present

## 2022-07-28 DIAGNOSIS — K5730 Diverticulosis of large intestine without perforation or abscess without bleeding: Secondary | ICD-10-CM | POA: Diagnosis not present

## 2022-07-28 DIAGNOSIS — Z79899 Other long term (current) drug therapy: Secondary | ICD-10-CM

## 2022-07-28 DIAGNOSIS — E8770 Fluid overload, unspecified: Secondary | ICD-10-CM | POA: Diagnosis present

## 2022-07-28 DIAGNOSIS — C3490 Malignant neoplasm of unspecified part of unspecified bronchus or lung: Secondary | ICD-10-CM | POA: Diagnosis not present

## 2022-07-28 DIAGNOSIS — T8383XA Hemorrhage of genitourinary prosthetic devices, implants and grafts, initial encounter: Secondary | ICD-10-CM | POA: Diagnosis not present

## 2022-07-28 DIAGNOSIS — S7292XA Unspecified fracture of left femur, initial encounter for closed fracture: Secondary | ICD-10-CM | POA: Diagnosis present

## 2022-07-28 DIAGNOSIS — J9601 Acute respiratory failure with hypoxia: Secondary | ICD-10-CM | POA: Diagnosis not present

## 2022-07-28 DIAGNOSIS — K567 Ileus, unspecified: Secondary | ICD-10-CM | POA: Diagnosis not present

## 2022-07-28 DIAGNOSIS — K828 Other specified diseases of gallbladder: Secondary | ICD-10-CM | POA: Diagnosis not present

## 2022-07-28 DIAGNOSIS — E669 Obesity, unspecified: Secondary | ICD-10-CM | POA: Diagnosis present

## 2022-07-28 DIAGNOSIS — R0602 Shortness of breath: Secondary | ICD-10-CM | POA: Diagnosis not present

## 2022-07-28 DIAGNOSIS — G4700 Insomnia, unspecified: Secondary | ICD-10-CM | POA: Diagnosis present

## 2022-07-28 DIAGNOSIS — Z888 Allergy status to other drugs, medicaments and biological substances status: Secondary | ICD-10-CM

## 2022-07-28 DIAGNOSIS — N19 Unspecified kidney failure: Secondary | ICD-10-CM | POA: Diagnosis not present

## 2022-07-28 DIAGNOSIS — Z902 Acquired absence of lung [part of]: Secondary | ICD-10-CM

## 2022-07-28 DIAGNOSIS — R0609 Other forms of dyspnea: Secondary | ICD-10-CM | POA: Diagnosis not present

## 2022-07-28 DIAGNOSIS — M7989 Other specified soft tissue disorders: Secondary | ICD-10-CM | POA: Diagnosis not present

## 2022-07-28 DIAGNOSIS — Z85118 Personal history of other malignant neoplasm of bronchus and lung: Secondary | ICD-10-CM

## 2022-07-28 LAB — COMPREHENSIVE METABOLIC PANEL
ALT: 20 U/L (ref 0–44)
AST: 28 U/L (ref 15–41)
Albumin: 2.3 g/dL — ABNORMAL LOW (ref 3.5–5.0)
Alkaline Phosphatase: 99 U/L (ref 38–126)
Anion gap: 12 (ref 5–15)
BUN: 128 mg/dL — ABNORMAL HIGH (ref 8–23)
CO2: 20 mmol/L — ABNORMAL LOW (ref 22–32)
Calcium: 7.8 mg/dL — ABNORMAL LOW (ref 8.9–10.3)
Chloride: 104 mmol/L (ref 98–111)
Creatinine, Ser: 4.41 mg/dL — ABNORMAL HIGH (ref 0.61–1.24)
GFR, Estimated: 12 mL/min — ABNORMAL LOW (ref >60)
Glucose, Bld: 140 mg/dL — ABNORMAL HIGH (ref 70–99)
Potassium: 5.8 mmol/L — ABNORMAL HIGH (ref 3.5–5.1)
Sodium: 136 mmol/L (ref 135–145)
Total Bilirubin: 1.3 mg/dL — ABNORMAL HIGH (ref 0.3–1.2)
Total Protein: 5.9 g/dL — ABNORMAL LOW (ref 6.5–8.1)

## 2022-07-28 LAB — CBC WITH DIFFERENTIAL/PLATELET
Abs Immature Granulocytes: 0.14 10*3/uL — ABNORMAL HIGH (ref 0.00–0.07)
Basophils Absolute: 0 10*3/uL (ref 0.0–0.1)
Basophils Relative: 0 %
Eosinophils Absolute: 0 10*3/uL (ref 0.0–0.5)
Eosinophils Relative: 0 %
HCT: 29.8 % — ABNORMAL LOW (ref 39.0–52.0)
Hemoglobin: 9.5 g/dL — ABNORMAL LOW (ref 13.0–17.0)
Immature Granulocytes: 1 %
Lymphocytes Relative: 1 %
Lymphs Abs: 0.3 10*3/uL — ABNORMAL LOW (ref 0.7–4.0)
MCH: 32.5 pg (ref 26.0–34.0)
MCHC: 31.9 g/dL (ref 30.0–36.0)
MCV: 102.1 fL — ABNORMAL HIGH (ref 80.0–100.0)
Monocytes Absolute: 1.3 10*3/uL — ABNORMAL HIGH (ref 0.1–1.0)
Monocytes Relative: 6 %
Neutro Abs: 19.6 10*3/uL — ABNORMAL HIGH (ref 1.7–7.7)
Neutrophils Relative %: 92 %
Platelets: 268 10*3/uL (ref 150–400)
RBC: 2.92 MIL/uL — ABNORMAL LOW (ref 4.22–5.81)
RDW: 11.9 % (ref 11.5–15.5)
WBC: 21.3 10*3/uL — ABNORMAL HIGH (ref 4.0–10.5)
nRBC: 0 % (ref 0.0–0.2)

## 2022-07-28 LAB — LACTIC ACID, PLASMA: Lactic Acid, Venous: 1.5 mmol/L (ref 0.5–1.9)

## 2022-07-28 LAB — I-STAT CHEM 8, ED
BUN: >130 mg/dL — ABNORMAL HIGH (ref 8–23)
Calcium, Ion: 0.96 mmol/L — ABNORMAL LOW (ref 1.15–1.40)
Chloride: 105 mmol/L (ref 98–111)
Creatinine, Ser: 5 mg/dL — ABNORMAL HIGH (ref 0.61–1.24)
Glucose, Bld: 137 mg/dL — ABNORMAL HIGH (ref 70–99)
HCT: 29 % — ABNORMAL LOW (ref 39.0–52.0)
Hemoglobin: 9.9 g/dL — ABNORMAL LOW (ref 13.0–17.0)
Potassium: 5.9 mmol/L — ABNORMAL HIGH (ref 3.5–5.1)
Sodium: 134 mmol/L — ABNORMAL LOW (ref 135–145)
TCO2: 22 mmol/L (ref 22–32)

## 2022-07-28 LAB — URINALYSIS, ROUTINE W REFLEX MICROSCOPIC
Bacteria, UA: NONE SEEN
Bilirubin Urine: NEGATIVE
Glucose, UA: NEGATIVE mg/dL
Ketones, ur: NEGATIVE mg/dL
Leukocytes,Ua: NEGATIVE
Nitrite: NEGATIVE
Protein, ur: NEGATIVE mg/dL
RBC / HPF: >50 RBC/hpf — ABNORMAL HIGH (ref 0–5)
Specific Gravity, Urine: 1.014 (ref 1.005–1.030)
pH: 5 (ref 5.0–8.0)

## 2022-07-28 LAB — TROPONIN I (HIGH SENSITIVITY)
Troponin I (High Sensitivity): 9 ng/L (ref <18)
Troponin I (High Sensitivity): 11 ng/L (ref <18)

## 2022-07-28 LAB — BRAIN NATRIURETIC PEPTIDE: B Natriuretic Peptide: 166.5 pg/mL — ABNORMAL HIGH (ref 0.0–100.0)

## 2022-07-28 MED ORDER — SODIUM CHLORIDE 0.9% FLUSH
3.0000 mL | Freq: Two times a day (BID) | INTRAVENOUS | Status: DC
  Administered 2022-07-28: 3 mL via INTRAVENOUS

## 2022-07-28 MED ORDER — SODIUM CHLORIDE 0.9 % IV BOLUS
500.0000 mL | Freq: Once | INTRAVENOUS | Status: AC
  Administered 2022-07-28: 500 mL via INTRAVENOUS

## 2022-07-28 MED ORDER — SODIUM ZIRCONIUM CYCLOSILICATE 10 G PO PACK: 10.0000 g | PACK | Freq: Three times a day (TID) | ORAL | Status: DC

## 2022-07-28 MED ORDER — ONDANSETRON HCL 4 MG/2ML IJ SOLN: 4.0000 mg | Freq: Four times a day (QID) | INTRAMUSCULAR | Status: DC | PRN

## 2022-07-28 MED ORDER — SODIUM CHLORIDE 0.9 % IV SOLN: INTRAVENOUS | Status: DC

## 2022-07-28 MED ORDER — SODIUM ZIRCONIUM CYCLOSILICATE 10 G PO PACK
10.0000 g | PACK | Freq: Once | ORAL | Status: AC
  Administered 2022-07-28: 10 g via ORAL
  Filled 2022-07-28: qty 1

## 2022-07-28 MED ORDER — SODIUM CHLORIDE 0.9 % IV SOLN
1.0000 g | INTRAVENOUS | Status: DC
  Administered 2022-07-28: 1 g via INTRAVENOUS
  Filled 2022-07-28: qty 10

## 2022-07-28 MED ORDER — SIMVASTATIN 20 MG PO TABS
40.0000 mg | ORAL_TABLET | Freq: Every day | ORAL | Status: DC
  Administered 2022-07-28: 40 mg via ORAL
  Filled 2022-07-28: qty 2

## 2022-07-28 MED ORDER — SODIUM ZIRCONIUM CYCLOSILICATE 10 G PO PACK
10.0000 g | PACK | Freq: Three times a day (TID) | ORAL | Status: DC
  Administered 2022-07-28 – 2022-07-29 (×2): 10 g via ORAL
  Filled 2022-07-28 (×2): qty 1

## 2022-07-28 MED ORDER — HEPARIN SODIUM (PORCINE) 5000 UNIT/ML IJ SOLN
5000.0000 [IU] | Freq: Three times a day (TID) | INTRAMUSCULAR | Status: DC
  Administered 2022-07-29 – 2022-07-31 (×7): 5000 [IU] via SUBCUTANEOUS
  Filled 2022-07-28 (×8): qty 1

## 2022-07-28 MED ORDER — SODIUM BICARBONATE 650 MG PO TABS
650.0000 mg | ORAL_TABLET | Freq: Two times a day (BID) | ORAL | Status: DC
  Administered 2022-07-28 – 2022-07-29 (×2): 650 mg via ORAL
  Filled 2022-07-28 (×2): qty 1

## 2022-07-28 MED ORDER — ACETAMINOPHEN 325 MG PO TABS
650.0000 mg | ORAL_TABLET | Freq: Four times a day (QID) | ORAL | Status: DC | PRN
  Administered 2022-07-31 – 2022-08-02 (×4): 650 mg via ORAL
  Filled 2022-07-28 (×4): qty 2

## 2022-07-28 MED ORDER — ACETAMINOPHEN 650 MG RE SUPP: 650.0000 mg | Freq: Four times a day (QID) | RECTAL | Status: DC | PRN

## 2022-07-28 MED ORDER — ONDANSETRON HCL 4 MG PO TABS: 4.0000 mg | ORAL_TABLET | Freq: Four times a day (QID) | ORAL | Status: DC | PRN

## 2022-07-28 NOTE — ED Notes (Signed)
Patient transported to X-ray 

## 2022-07-28 NOTE — H&P (Signed)
History and Physical    John Riley WCH:[id number] DOB: [birth year] DOA: 07/28/2022  PCP: Dorothyann Peng, NP   Patient coming from: SNF   Chief Complaint: Swelling, weight gain, supplemental O2 requirement   HPI: John Riley is a 88 y.o. male with medical history significant for hypertension, hyperlipidemia, non-small cell lung cancer s/p resection, and left hip fracture status post ORIF on 07/17/2022 who presents to the emergency department for evaluation of weight gain, leg swelling, and supplemental oxygen requirement.  Patient reports a mild cough but denies any shortness of breath, chest pain, fever, or chills.  He had been experiencing pain across his mid and lower abdomen recently that he states resolved after he was successfully treated with an enema.  Patient was requiring supplemental oxygen during his recent hospitalization and was discharged to the SNF with supplemental oxygen.  ED Course: Upon arrival to the ED, patient is found to be afebrile and saturating mid 90s on 2 L/min of supplemental oxygen with normal heart rate and systolic blood pressure of 93 and greater.  Blood work is most notable for BUN 128, creatinine 4.41, potassium 5.8, albumin 2.3, WBC 21,300, and hemoglobin 9.5.  EKG demonstrates sinus rhythm with PACs.  Renal ultrasound is negative for hydronephrosis.  CT of the chest/abdomen/pelvis is concerning for at least partial SBO and also notable for inspissated material in the right lower lobe bronchial tree and mild fullness of the renal collecting systems without hydronephrosis.  Nephrology and surgery were consulted by the ED PA, blood cultures were collected, and the patient was treated with 500 cc of saline, Lokelma, and cefepime in the ED.  Review of Systems:  All other systems reviewed and apart from HPI, are negative.  Past Medical History:  Diagnosis Date   ELEVATED PROSTATE SPECIFIC ANTIGEN 07/31/2009   HYPERLIPIDEMIA 06/28/2007   HYPERTENSION  06/28/2007   INSOMNIA 12/07/2007   Non-small cell lung cancer ([place]) 2009   PULMONARY NODULE, RIGHT MIDDLE LOBE 12/07/2007   TOBACCO ABUSE 11/09/2007    Past Surgical History:  Procedure Laterality Date   APPENDECTOMY     CATARACT EXTRACTION Left 10/30/2021   INTRAMEDULLARY (IM) NAIL INTERTROCHANTERIC Left 07/17/2022   Procedure: INTRAMEDULLARY (IM) NAIL INTERTROCHANTERIC;  Surgeon: Meredith Pel, MD;  Location: [place];  Service: Orthopedics;  Laterality: Left;   LOBECTOMY     right middle   PROSTATE SURGERY     bx   VEIN LIGATION AND STRIPPING Left 1974    Social History:   reports that he quit smoking about 14 years ago. His smoking use included cigarettes. He has never used smokeless tobacco. He reports that he does not drink alcohol and does not use drugs.  Allergies  Allergen Reactions   Niacin     History reviewed. No pertinent family history.   Prior to Admission medications   Medication Sig Start Date End Date Taking? Authorizing Provider  acetaminophen (TYLENOL) 500 MG tablet Take 1,000 mg by mouth every 6 (six) hours as needed for mild pain.    [provider]  aspirin EC 81 MG tablet Take 1 tablet (81 mg total) by mouth 2 (two) times daily. Swallow whole. 07/21/22   Regalado, Belkys A, MD  atenolol (TENORMIN) 100 MG tablet Take 1 tablet (100 mg total) by mouth daily. 09/19/21   Nafziger, Tommi Rumps, NP  cyclobenzaprine (FLEXERIL) 10 MG tablet Take 1 tablet (10 mg total) by mouth at bedtime. 07/14/22   Nafziger, Tommi Rumps, NP  docusate sodium (COLACE) 100 MG capsule  Take 1 capsule (100 mg total) by mouth 2 (two) times daily. 07/21/22   Regalado, Belkys A, MD  guaifenesin (ROBITUSSIN) 100 MG/5ML syrup Take 200 mg by mouth 3 (three) times daily as needed for cough.    [provider]  lisinopril (ZESTRIL) 40 MG tablet Take 1 tablet (40 mg total) by mouth daily. 09/19/21   Nafziger, Tommi Rumps, NP  senna (SENOKOT) 8.6 MG TABS tablet Take 1 tablet (8.6 mg total) by mouth 2  (two) times daily. 07/21/22   Regalado, Belkys A, MD  simvastatin (ZOCOR) 40 MG tablet Take 1 tablet (40 mg total) by mouth at bedtime. 09/19/21   Dorothyann Peng, NP    Physical Exam: Vitals:   07/28/22 1859 07/28/22 1950 07/28/22 2137 07/28/22 2241  BP: 93/70 (!) 112/56 101/61 (!) 119/95  Pulse: 77 69 71 (!) 14  Resp: 19 18 17  (!) 25  Temp:   98.8 F (37.1 C)   TempSrc:   Oral   SpO2: 95% 96% 98% 92%    Constitutional: NAD, no pallor or diaphoresis   Eyes: PERTLA, lids and conjunctivae normal ENMT: Mucous membranes are moist. Posterior pharynx clear of any exudate or lesions.   Neck: supple, no masses  Respiratory: no wheezing, no crackles. No accessory muscle use.  Cardiovascular: S1 & S2 heard, regular rate and rhythm. Pitting edema involving b/l LEs. Abdomen: Soft, non-tender. Bowel sounds active.  Musculoskeletal: no clubbing / cyanosis. No joint deformity upper and lower extremities.   Skin: no significant rashes, lesions, ulcers. Warm, dry, well-perfused. Neurologic: CN 2-12 grossly intact. Moving all extremities. Alert and oriented.  Psychiatric: Calm. Cooperative.    Labs and Imaging on Admission: I have personally reviewed following labs and imaging studies  CBC: Recent Labs  Lab 07/28/22 1449 07/28/22 1607  WBC 21.3*  --   NEUTROABS 19.6*  --   HGB 9.5* 9.9*  HCT 29.8* 29.0*  MCV 102.1*  --   PLT 268  --    Basic Metabolic Panel: Recent Labs  Lab 07/28/22 1449 07/28/22 1607  NA 136 134*  K 5.8* 5.9*  CL 104 105  CO2 20*  --   GLUCOSE 140* 137*  BUN 128* >130*  CREATININE 4.41* 5.00*  CALCIUM 7.8*  --    GFR: Estimated Creatinine Clearance: 11.8 mL/min (A) (by C-G formula based on SCr of 5 mg/dL (H)). Liver Function Tests: Recent Labs  Lab 07/28/22 1449  AST 28  ALT 20  ALKPHOS 99  BILITOT 1.3*  PROT 5.9*  ALBUMIN 2.3*   No results for input(s): "LIPASE", "AMYLASE" in the last 168 hours. No results for input(s): "AMMONIA" in the last 168  hours. Coagulation Profile: No results for input(s): "INR", "PROTIME" in the last 168 hours. Cardiac Enzymes: No results for input(s): "CKTOTAL", "CKMB", "CKMBINDEX", "TROPONINI" in the last 168 hours. BNP (last 3 results) No results for input(s): "PROBNP" in the last 8760 hours. HbA1C: No results for input(s): "HGBA1C" in the last 72 hours. CBG: No results for input(s): "GLUCAP" in the last 168 hours. Lipid Profile: No results for input(s): "CHOL", "HDL", "LDLCALC", "TRIG", "CHOLHDL", "LDLDIRECT" in the last 72 hours. Thyroid Function Tests: No results for input(s): "TSH", "T4TOTAL", "FREET4", "T3FREE", "THYROIDAB" in the last 72 hours. Anemia Panel: No results for input(s): "VITAMINB12", "FOLATE", "FERRITIN", "TIBC", "IRON", "RETICCTPCT" in the last 72 hours. Urine analysis:    Component Value Date/Time   COLORURINE YELLOW 07/28/2022 1707   APPEARANCEUR HAZY (A) 07/28/2022 1707   LABSPEC 1.014 07/28/2022 1707  PHURINE 5.0 07/28/2022 1707   GLUCOSEU NEGATIVE 07/28/2022 1707   HGBUR LARGE (A) 07/28/2022 1707   BILIRUBINUR NEGATIVE 07/28/2022 1707   KETONESUR NEGATIVE 07/28/2022 1707   PROTEINUR NEGATIVE 07/28/2022 1707   UROBILINOGEN 0.2 02/21/2008 1113   NITRITE NEGATIVE 07/28/2022 1707   LEUKOCYTESUR NEGATIVE 07/28/2022 1707   Sepsis Labs: @LABRCNTIP (procalcitonin:4,lacticidven:4) )No results found for this or any previous visit (from the past 240 hour(s)).   Radiological Exams on Admission: CT CHEST ABDOMEN PELVIS WO CONTRAST  Result Date: 07/28/2022 CLINICAL DATA:  Shortness of breath and abdominal pain, initial encounter EXAM: CT CHEST, ABDOMEN AND PELVIS WITHOUT CONTRAST TECHNIQUE: Multidetector CT imaging of the chest, abdomen and pelvis was performed following the standard protocol without IV contrast. RADIATION DOSE REDUCTION: This exam was performed according to the departmental dose-optimization program which includes automated exposure control, adjustment of the  mA and/or kV according to patient size and/or use of iterative reconstruction technique. COMPARISON:  Chest x-ray from earlier in the same day. FINDINGS: CT CHEST FINDINGS Cardiovascular: Somewhat limited due to lack of IV contrast. Atherosclerotic calcifications of the thoracic aorta are noted. Decreased attenuation is noted in the cardiac blood pool suggestive of underlying anemia. Coronary calcifications are seen. Pulmonary artery as visualized is within normal limits. Mediastinum/Nodes: Thoracic inlet is unremarkable. No hilar or mediastinal adenopathy is noted. The esophagus is within normal limits. Lungs/Pleura: Bibasilar atelectasis is noted. Mild inspissated material is noted within the right lower lobe bronchial tree which may represent aspiration. Mild emphysematous changes are seen. No parenchymal nodule is noted. Musculoskeletal: Degenerative changes of the thoracic spine are seen. CT ABDOMEN PELVIS FINDINGS Hepatobiliary: Gallbladder is well distended with dependent gallstones. Liver is within normal limits. Pancreas: Unremarkable. No pancreatic ductal dilatation or surrounding inflammatory changes. Spleen: Normal in size without focal abnormality. Adrenals/Urinary Tract: Adrenal glands are unremarkable. Kidneys demonstrate mild fullness of the collecting systems bilaterally although no significant ureteral dilatation is seen. Increased perinephric stranding is noted on the left. Significant decompression of the bladder is noted with some surrounding of the dilated loops of small bowel described below. Foley catheter is noted within the substance of the prostate. Stomach/Bowel: Scattered diverticular change of the colon is noted without evidence of diverticulitis. Multiple dilated loops of small bowel are noted involving the distal jejunum and proximal to mid ileum. The distal ileum appears within normal limits. A transition zone is noted deep in the pelvis best seen on image number 111 of series 3. The  decompressed bladder surrounds the area of transition in the low pelvis. The stomach and proximal jejunum appear within normal limits. Vascular/Lymphatic: Aortic atherosclerosis. No enlarged abdominal or pelvic lymph nodes. Reproductive: Prostate is unremarkable. Other: Minimal free fluid is noted within the pelvis. Mild changes of anasarca are seen. Musculoskeletal: Degenerative changes of the lumbar spine are noted. No acute bony abnormality is noted. Changes of recent left hip fixation are seen. IMPRESSION: Multiple dilated fluid-filled loops of small bowel involving the distal jejunum and proximal ileum. This is consistent with at least a partial small bowel obstruction. Transition zone is noted deep in the pelvis as described above likely related to adhesions. Foley catheter is noted within the substance of the prostate with significant decompression of the bladder identified. Cholelithiasis without complicating factors. Mild fullness of the collecting systems although no true hydronephrosis is seen. Mild bibasilar atelectasis with inspissated material within the lower lobe bronchial tree on the right which may represent some aspiration. Electronically Signed   By: Linus Mako.D.  On: 07/28/2022 19:51   US Renal  Result Date: 07/28/2022 CLINICAL DATA:  Renal failure EXAM: RENAL / URINARY TRACT ULTRASOUND COMPLETE COMPARISON:  None Available. FINDINGS: Right Kidney: Renal measurements: 12.5 x 6.0 x 4.8 cm = volume: 189 mL. Right upper pole cyst 2 cm. Right lower pole cyst 2.5 cm. Echogenicity within normal limits. No mass or hydronephrosis visualized. Left Kidney: Renal measurements: 13.1 x 7.3 x 4.3 cm = volume: 213 mL. Left upper pole cyst 2.4 cm. Echogenicity within normal limits. No mass or hydronephrosis visualized. Bladder: Appears normal for degree of bladder distention. Other: None. IMPRESSION: Negative for hydronephrosis.  Bilateral renal cysts Electronically Signed   By: Franchot Gallo M.D.    On: 07/28/2022 18:22   DG Chest 1 View  Result Date: 07/28/2022 CLINICAL DATA:  Shortness of breath.  Bilateral leg swelling. EXAM: CHEST  1 VIEW COMPARISON:  AP chest 07/20/2022, 06/20/2022; chest two views 05/31/2022; CT chest 04/09/2010 FINDINGS: Cardiac silhouette is again mildly enlarged. There is again a tortuous thoracic aorta with mediastinal contours unchanged. There is again moderate right-sided volume loss, noting prior right lower lung partial resection on prior CT. Mild elevation of the right hemidiaphragm is unchanged. Mild right basilar linear subsegmental atelectasis and/or scarring is similar to prior. The left lung is clear. No definite pleural effusion. No pneumothorax. 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 IMPRESSION: 1. Postsurgical changes of partial right lower lung resection, chronic. 2. Mild right basilar subsegmental atelectasis and/or scarring, similar to prior. No acute lung process is seen. Electronically Signed   By: Yvonne Kendall M.D.   On: 07/28/2022 15:48    EKG: Independently reviewed. Sinus rhythm, PACs.   Assessment/Plan   1. AKI; hyperkalemia; acidosis  - BUN is 128 and SCr 4.41 on admission, up from 18 & 0.79 on 07/21/22; serum bicarb is 20 and potassium is 5.8  - No hydronephrosis on imaging in ED  - Appreciate nephrology consult  - Hold lisinopril and baclofen, hydrate with isotonic IVF, Lokelma TID x1 day, start sodium bicarbonate, renally-dose medications, follow serial chem panels   2. Ileus - Appreciate surgery consultation, plan to mobilize, manage conservatively    3. Hypoxic respiratory failure  - Per discharge summary, he was requiring supplemental O2 when discharged to SNF on 07/21/22  - No acute findings on CT chest though atelectasis noted and question of aspiration  - Start incentive spirometry, continue supplemental O2 as needed, consult SLP once ileus improving and ready to start diet    4. Leukocytosis  - WBC 21,300  on admission without fever or apparent infection  - Blood cultures were collected in ED and cefepime administered  - Hold further antibiotics for now, follow cultures and clinical course   5. Hypertension  - BP low in ED and antihypertensives held on admission to optimize renal perfusion    6. Hyperlipidemia  - Continue Zocor    DVT prophylaxis: sq heparin  Code Status: Full Level of Care: Level of care: Telemetry Medical Family Communication: None present  Disposition Plan:  Patient is from: SNF  Anticipated d/c is to: SNF  Anticipated d/c date is: 08/01/22  Patient currently: Pending improved/stable renal function, tolerance of adequate oral intake  Consults called: Nephrology, surgery  Admission status: Inpatient     Vianne Bulls, MD [facility]  07/28/2022, 10:56 PM

## 2022-07-28 NOTE — Progress Notes (Signed)
Pharmacy Antibiotic Note  John Riley is a 88 y.o. male admitted on 07/28/2022 with sepsis. Patient recently discharges following surgical repair for hip fracture ~2 weeks prior. Since 2 days ago patient has needed supplemental oxygen 2L along with a 19 pound weight gain over the last week due to lower extremity swelling in addition to a productive cough. Pharmacy has been consulted for cefepime dosing for sepsis work-up.  Of note, patient has a significant AKI on admission. Baseline Scr ~0.7 (06/2022), now trended up 4.4 > 5.0.  Plan: Cefepime 1g IV Q24h Adjust dose if/when renal function improves F/u cultures, clinical progress, levels as indicated De-escalate when able  Temp (24hrs), Avg:96.8 F (36 C), Min:96.8 F (36 C), Max:96.8 F (36 C)  Recent Labs  Lab 07/28/22 1449 07/28/22 1607  WBC 21.3*  --   CREATININE 4.41* 5.00*    Estimated Creatinine Clearance: 11.8 mL/min (A) (by C-G formula based on SCr of 5 mg/dL (H)).    Allergies  Allergen Reactions   Niacin    Thank you for allowing pharmacy to be a part of this patient's care.  Ardyth Harps, PharmD Clinical Pharmacist

## 2022-07-28 NOTE — Consult Note (Signed)
Nephrology Consult    Assessment/Recommendations: John Riley is a/an 88 y.o. male with a past medical history notable for AKI    AKI  -at this junction, suspecting AKI to be secondary to hemodynamic insults in the context of orthostatic symptoms he has been having at SNF with concomitant ACEI use. However, given his recent issues of a fracture and elevated WBC, would want to make sure he is not having any urinary retention and/or a UTI -UA w/ microscopy and CT abd/pelv + renal u/s pending -if CT chest is okay, would recommend trial of isotonic fluids to see if this helps. In the interim, would recommend foley placement as well.  -Continue to monitor daily Cr, Dose meds for GFR<15 -hold home lisinopril -if kidney function does not improve with medical mgmt, then will need to consider renal replacement therapy -Avoid nephrotoxic medications including NSAIDs and iodinated intravenous contrast exposure unless the latter is absolutely indicated.  Preferred narcotic agents for pain control are hydromorphone, fentanyl, and methadone. Morphine should not be used. Avoid Baclofen and avoid oral sodium phosphate and magnesium citrate based laxatives / bowel preps. Continue strict Input and Output monitoring. Will monitor the patient closely with you and intervene or adjust therapy as indicated by changes in clinical status/labs   Hyperkalemia -secondary to AKI -hold lisinopril. Fluids as above -lokelma 10g TID x 1 day -renal diet  SOB -no pulm edema/vasc congestion on CXR, does have right basilar atelectasis -CT chest pending -per primary  Metabolic acidosis -will start nahco3 650mg  BID. Anticipating this can be discontinued if kidney function improves  Hypertension: -more so on the lower side and with orthostatic symptoms as an outpatient, would recommend hydration and checking orthostats on a daily basis. Hold lisinopril as above, would also recommend holding atenolol if  able  Anemia: -Transfuse for Hgb<7 g/dL -macrocytic. Mgmt per primary service -can check Fe panel if needed  [facility] 07/28/2022 4:39 PM   _____________________________________________________________________________________   History of Present Illness: John Riley is a/an 88 y.o. male with a past medical history of recent femoral fracture, NSCLC, prostate Ca, BPH, HTN, HLD who presents to [facility] ER from SNF for possible new onset CHF. Per ER chart, patient has been needing supplemental O2 and has been having LE swelling. Has also been having issues with BP dropping when getting out of bed therefore has not been able to do PT. In the ER, was found to have a Cr of 4.4 (normal Cr of 0.79 on 12/5) with a BUN of 128 and K of 5.8. CXR did not reveal an acute process I.e. CHF changes. Patient seen and examined in the ER. Patient reports that he is not sure if he was receiving NSAIDs at his facility but does know that he was still getting his lisinopril. He does report that he has been mostly staying in bed. He does report that he has been constipated at his facility despite medications he was receiving. Foley placed in ER and patient reports that his lower abdominal pain has improved. He does report that he was still urinating. Otherwise no other complaints currently.   Medications:  Current Facility-Administered Medications  Medication Dose Route Frequency Provider Last Rate Last Admin   sodium zirconium cyclosilicate (LOKELMA) packet 10 g  10 g Oral Once Sherrill Raring, PA-C       Current Outpatient Medications  Medication Sig Dispense Refill   acetaminophen (TYLENOL) 500 MG tablet Take 1,000 mg by mouth every 6 (six)  hours as needed for mild pain.     aspirin EC 81 MG tablet Take 1 tablet (81 mg total) by mouth 2 (two) times daily. Swallow whole. 30 tablet 12   atenolol (TENORMIN) 100 MG tablet Take 1 tablet (100 mg total) by mouth daily. 90 tablet 3    cyclobenzaprine (FLEXERIL) 10 MG tablet Take 1 tablet (10 mg total) by mouth at bedtime. 15 tablet 0   docusate sodium (COLACE) 100 MG capsule Take 1 capsule (100 mg total) by mouth 2 (two) times daily. 10 capsule 0   guaifenesin (ROBITUSSIN) 100 MG/5ML syrup Take 200 mg by mouth 3 (three) times daily as needed for cough.     lisinopril (ZESTRIL) 40 MG tablet Take 1 tablet (40 mg total) by mouth daily. 90 tablet 3   senna (SENOKOT) 8.6 MG TABS tablet Take 1 tablet (8.6 mg total) by mouth 2 (two) times daily. 120 tablet 0   simvastatin (ZOCOR) 40 MG tablet Take 1 tablet (40 mg total) by mouth at bedtime. 90 tablet 3     ALLERGIES Niacin  MEDICAL HISTORY Past Medical History:  Diagnosis Date   ELEVATED PROSTATE SPECIFIC ANTIGEN 07/31/2009   HYPERLIPIDEMIA 06/28/2007   HYPERTENSION 06/28/2007   INSOMNIA 12/07/2007   Non-small cell lung cancer ([place]) 2009   PULMONARY NODULE, RIGHT MIDDLE LOBE 12/07/2007   TOBACCO ABUSE 11/09/2007     SOCIAL HISTORY Social History   Socioeconomic History   Marital status: Widowed    Spouse name: Not on file   Number of children: Not on file   Years of education: Not on file   Highest education level: Not on file  Occupational History   Not on file  Tobacco Use   Smoking status: Former    Types: Cigarettes    Quit date: 08/18/2007    Years since quitting: 14.9   Smokeless tobacco: Never  Substance and Sexual Activity   Alcohol use: No   Drug use: No   Sexual activity: Not on file  Other Topics Concern   Not on file  Social History Narrative   Retired - Oncologist    Social Determinants of Radio broadcast assistant Strain: Not on file  Food Insecurity: No Food Insecurity (07/18/2022)   Hunger Vital Sign    Worried About Running Out of Food in the Last Year: Never true    [place] in the Last Year: Never true  Transportation Needs: No Transportation Needs (07/18/2022)   PRAPARE - Hydrologist  (Medical): No    Lack of Transportation (Non-Medical): No  Physical Activity: Not on file  Stress: Not on file  Social Connections: Not on file  Intimate Partner Violence: Not At Risk (07/18/2022)   Humiliation, Afraid, Rape, and Kick questionnaire    Fear of Current or Ex-Partner: No    Emotionally Abused: No    Physically Abused: No    Sexually Abused: No     FAMILY HISTORY No family history on file.   Review of Systems: 12 systems reviewed Otherwise as per HPI, all other systems reviewed and negative  Physical Exam: Vitals:   07/28/22 1505 07/28/22 1559  BP: (!) 93/56 107/66  Pulse:  68  Resp:  18  Temp:    SpO2:  96%   No intake/output data recorded. No intake or output data in the 24 hours ending 07/28/22 1639 General: well-appearing, no acute distress HEENT: anicteric sclera, oropharynx clear without lesions CV: regular rate,  normal rhythm, no murmurs, no gallops, no rubs Lungs: slightly diminished air entry rt>lt base otherwise no w/r/r/c, unlabored Abd: soft, non-tender, non-distended Skin: no visible lesions or rashes Psych: alert, engaged, appropriate mood and affect Musculoskeletal: trace edema bilateral LEs Neuro: normal speech, no gross focal deficits   Test Results Reviewed Lab Results  Component Value Date   NA 134 (L) 07/28/2022   K 5.9 (H) 07/28/2022   CL 105 07/28/2022   CO2 20 (L) 07/28/2022   BUN >130 (H) 07/28/2022   CREATININE 5.00 (H) 07/28/2022   GFR 78.39 07/14/2022   CALCIUM 7.8 (L) 07/28/2022   ALBUMIN 2.3 (L) 07/28/2022   PHOS 2.9 07/21/2022     I have reviewed all relevant outside healthcare records related to the patient's kidney injury.

## 2022-07-28 NOTE — ED Notes (Signed)
This RN was able to irrigate patient's foley catheter with assistance from [employer]. Multiple small clots upon irrigating with red colored urine returned. Provider made aware of same.

## 2022-07-28 NOTE — ED Notes (Signed)
This RN called lab at this time in regards to lactic acid specimen. Per lab tech sample has just been received although this RN sent sample at 1602.

## 2022-07-28 NOTE — ED Provider Notes (Signed)
[facility] EMERGENCY DEPARTMENT Provider Note   CSN: [id number] Arrival date & time: 07/28/22  1420     History  Chief Complaint  Patient presents with   Shortness of Breath    Patient arrives via EMS from [facility]. Patient has 19 lb weight gain since arriving to facility no history of CHF, pitting edema to bilateral extremities. Patient arrives on 2 liters of oxygen but does not normally wear oxygen at baseline. Per staff at rehab when patient gets out of bed his BP drops so they have been unable to complete PT. BP 104/60 HR 80 96% on 2 liters CBG 153. GCS 15.    John Riley is a 88 y.o. male.   Shortness of Breath    Patient presents from skilled nursing facility by EMS due to possible new onset of CHF.  Patient had hip fractures and surgical repair about 12 days ago, has been in skilled nursing but unable to start PT due to weakness.  2 days ago patient started needing supplemental oxygen 2 L which is new as of 48 hours ago.  He has had a 19 pound weight gain over the last week due to lower extremity swelling.  Patient Nu[place] chest pain but does have lower abdominal pain.  He is having a productive cough.  Home Medications Prior to Admission medications   Medication Sig Start Date End Date Taking? Authorizing Provider  acetaminophen (TYLENOL) 500 MG tablet Take 1,000 mg by mouth every 6 (six) hours as needed for mild pain.    [provider]  aspirin EC 81 MG tablet Take 1 tablet (81 mg total) by mouth 2 (two) times daily. Swallow whole. 07/21/22   Regalado, Belkys A, MD  atenolol (TENORMIN) 100 MG tablet Take 1 tablet (100 mg total) by mouth daily. 09/19/21   Nafziger, Tommi Rumps, NP  cyclobenzaprine (FLEXERIL) 10 MG tablet Take 1 tablet (10 mg total) by mouth at bedtime. 07/14/22   Nafziger, Tommi Rumps, NP  docusate sodium (COLACE) 100 MG capsule Take 1 capsule (100 mg total) by mouth 2 (two) times daily. 07/21/22   Regalado, Belkys A, MD  guaifenesin (ROBITUSSIN)  100 MG/5ML syrup Take 200 mg by mouth 3 (three) times daily as needed for cough.    [provider]  lisinopril (ZESTRIL) 40 MG tablet Take 1 tablet (40 mg total) by mouth daily. 09/19/21   Nafziger, Tommi Rumps, NP  senna (SENOKOT) 8.6 MG TABS tablet Take 1 tablet (8.6 mg total) by mouth 2 (two) times daily. 07/21/22   Regalado, Belkys A, MD  simvastatin (ZOCOR) 40 MG tablet Take 1 tablet (40 mg total) by mouth at bedtime. 09/19/21   Nafziger, Tommi Rumps, NP      Allergies    Niacin    Review of Systems   Review of Systems  Respiratory:  Positive for shortness of breath.     Physical Exam Updated Vital Signs SpO2 96%  Physical Exam Vitals and nursing note reviewed. Exam conducted with a chaperone present.  Constitutional:      Appearance: Normal appearance. He is ill-appearing.  HENT:     Head: Normocephalic and atraumatic.  Eyes:     General: No scleral icterus.       Right eye: No discharge.        Left eye: No discharge.     Extraocular Movements: Extraocular movements intact.     Pupils: Pupils are equal, round, and reactive to light.  Cardiovascular:     Rate and Rhythm:  Normal rate and regular rhythm.     Pulses: Normal pulses.     Heart sounds: Normal heart sounds. No murmur heard.    No friction rub. No gallop.  Pulmonary:     Effort: Pulmonary effort is normal. No respiratory distress.     Breath sounds: Rales present.  Abdominal:     General: Abdomen is flat. Bowel sounds are normal. There is no distension.     Palpations: Abdomen is soft.     Tenderness: There is abdominal tenderness.  Musculoskeletal:     Right lower leg: Edema present.     Left lower leg: Edema present.  Skin:    General: Skin is warm and dry.     Coloration: Skin is not jaundiced.  Neurological:     Mental Status: He is alert. Mental status is at baseline.     Coordination: Coordination normal.     ED Results / Procedures / Treatments   Labs (all labs ordered are listed, but only abnormal  results are displayed) Labs Reviewed  CBC WITH DIFFERENTIAL/PLATELET  COMPREHENSIVE METABOLIC PANEL  BRAIN NATRIURETIC PEPTIDE  TROPONIN I (HIGH SENSITIVITY)    EKG None  Radiology No results found.  Procedures Procedures    Medications Ordered in ED Medications - No data to display  ED Course/ Medical Decision Making/ A&P                           Medical Decision Making Amount and/or Complexity of Data Reviewed Labs: ordered. Radiology: ordered.  Risk Prescription drug management.   Patient presents due to shortness of breath and lower extremity pain and swelling with weight gain.  Differential includes but not limited to PE, CHF, AKI, acute intra-abdominal process, sepsis, nephrotic syndrome.  Reviewed external medical records.  Patient was recently admitted due to fracture, he has decreased mobility and has been in a SNF last few days.  He has been requiring supplemental oxygen over the last 2 days at his SNF.  I ordered, viewed and interpreted laboratory workup. CBC with new leukocytosis  WBC 21.3.  He is anemic with hemoglobin of 9.5 which is not grossly changed compared to previous. CMP with new renal failure with a creatinine of 4.41.  BUN/creatinine ratio 29 suggesting prerenal.  Patient is hyperkalemic with a potassium of 5.8.  I will consult nephro pathology. Troponin is flat/low. 9--11.  BNP mildly elevated at 153.  Lactic acid is low at 1.5. Blood cultures are pending. UA with hematuria red blood cells greater than 50.  No specific findings of a UTI.  Chest x-ray without any signs of pulmonary edema.  No underlying infection or pneumonia.  I do think patient needs an echocardiogram and some point I do not think this is a new onset CHF given BNP is relatively low, no cardiomegaly or pulmonary vascular congestion.  I think the lower extremity edema is more likely attributable to the acute renal failure.  Considered PE but not tachycardic and no chest pain.  Additionally low troponins and bnp make larger clot unlikely.  Given his renal status he would not be a candidate for contrast and given clinical suspicion is low I do not think he needs emergent scan.  Will order CT chest and abdomen to better evaluate for underlying pathology given he has this new white count of 21.3.  Will empirically treat with cefepime and small dose of fluids at 500 mL. He slightly hypotensive but mentating well, afebrile  and no tachycardia or tachypnea.  Discussed with my attending, will hold off is activating as a code sepsis but will treat with small fluid bolus while waiting for CT chest prior to more aggressive fluid resuscitation given he is fluid overloaded.    I consulted with nephrology. Spoke with Dr. Joelyn Oms with nephrology, they advise UA, renal ultrasound, lokelma, they will evaluate the patient.  Renal ultrasound unrevealing. CT chest and abdomen are pending at time of shift change.  Patient will absolutely need admission for AKI/acute renal injury.  He has an impressive leukocytosis with left shift so we are still waiting for CT chest and abdomen pelvis to evaluate for possible infectious source given UA was negative and chest x-ray was without any underlying pneumonia.  Case discussed with PA Ransom at shift change, she will follow results.  Discussed HPI, physical exam and plan of care for this patient with attending Dene Gentry. The attending physician evaluated this patient as part of a shared visit and agrees with plan of care.         Final Clinical Impression(s) / ED Diagnoses Final diagnoses:  None    Rx / DC Orders ED Discharge Orders     None         Sherrill Raring, [place] 07/30/22 1749    Valarie Merino, MD 08/01/22 1300

## 2022-07-28 NOTE — Consult Note (Incomplete)
Surgical Evaluation Requesting provider: Sherrell Puller PA-C  Chief Complaint: Shortness of breath, weight gain  HPI: 88 year old male who underwent a left intertrochanteric intramedullary nail on 12/1 after sustaining a hip fracture and was discharged to skilled nursing where he was unable to start physical therapy due to weakness.  Apparently he developed a new oxygen requirement ( 2 L nasal cannula) about 2 days ago and was noted to have a 19 pound weight gain over the last week with lower extremity edema.  Also noted to have some orthostatic hypotension.  He was brought to the [facility] emergency department with concern for possible new onset heart failure.  On arrival the patient denies any chest pain but does note lower abdominal pain and a productive cough.  No history of nausea or vomiting or decreased appetite.  Unknown last bowel movement or flatus, but constipation while at SNF was noted..  Noted to have a concerning acute renal failure on presentation with a creatinine of 4.4 and a BUN of 128, which were normal when he was discharged a week ago.  Note, his lower abdominal pain resolved after Foley placement here in the emergency room. He does have a history of non-small cell lung cancer status post remote resection, prostate cancer, BPH, hypertension, hyperlipidemia. CT scan was performed due to complaint of abdominal pain initially and white blood cell count and notes multiple dilated small bowel loops with possible transition zone in the pelvis.  Past abdominal surgical history includes appendectomy  Allergies  Allergen Reactions   Niacin     Past Medical History:  Diagnosis Date   ELEVATED PROSTATE SPECIFIC ANTIGEN 07/31/2009   HYPERLIPIDEMIA 06/28/2007   HYPERTENSION 06/28/2007   INSOMNIA 12/07/2007   Non-small cell lung cancer ([place]) 2009   PULMONARY NODULE, RIGHT MIDDLE LOBE 12/07/2007   TOBACCO ABUSE 11/09/2007    Past Surgical History:  Procedure Laterality Date    APPENDECTOMY     CATARACT EXTRACTION Left 10/30/2021   INTRAMEDULLARY (IM) NAIL INTERTROCHANTERIC Left 07/17/2022   Procedure: INTRAMEDULLARY (IM) NAIL INTERTROCHANTERIC;  Surgeon: Meredith Pel, MD;  Location: [place];  Service: Orthopedics;  Laterality: Left;   LOBECTOMY     right middle   PROSTATE SURGERY     bx   VEIN LIGATION AND STRIPPING Left 1974    History reviewed. No pertinent family history.  Social History   Socioeconomic History   Marital status: Widowed    Spouse name: Not on file   Number of children: Not on file   Years of education: Not on file   Highest education level: Not on file  Occupational History   Not on file  Tobacco Use   Smoking status: Former    Types: Cigarettes    Quit date: 08/18/2007    Years since quitting: 14.9   Smokeless tobacco: Never  Substance and Sexual Activity   Alcohol use: No   Drug use: No   Sexual activity: Not on file  Other Topics Concern   Not on file  Social History Narrative   Retired - Oncologist    Social Determinants of Radio broadcast assistant Strain: Not on file  Food Insecurity: No Food Insecurity (07/18/2022)   Hunger Vital Sign    Worried About Running Out of Food in the Last Year: Never true    [place] in the Last Year: Never true  Transportation Needs: No Transportation Needs (07/18/2022)   PRAPARE - Hydrologist (Medical):  No    Lack of Transportation (Non-Medical): No  Physical Activity: Not on file  Stress: Not on file  Social Connections: Not on file    No current facility-administered medications on file prior to encounter.   Current Outpatient Medications on File Prior to Encounter  Medication Sig Dispense Refill   acetaminophen (TYLENOL) 500 MG tablet Take 1,000 mg by mouth every 6 (six) hours as needed for mild pain.     aspirin EC 81 MG tablet Take 1 tablet (81 mg total) by mouth 2 (two) times daily. Swallow whole. 30 tablet 12   atenolol  (TENORMIN) 100 MG tablet Take 1 tablet (100 mg total) by mouth daily. 90 tablet 3   cyclobenzaprine (FLEXERIL) 10 MG tablet Take 1 tablet (10 mg total) by mouth at bedtime. 15 tablet 0   docusate sodium (COLACE) 100 MG capsule Take 1 capsule (100 mg total) by mouth 2 (two) times daily. 10 capsule 0   guaifenesin (ROBITUSSIN) 100 MG/5ML syrup Take 200 mg by mouth 3 (three) times daily as needed for cough.     lisinopril (ZESTRIL) 40 MG tablet Take 1 tablet (40 mg total) by mouth daily. 90 tablet 3   senna (SENOKOT) 8.6 MG TABS tablet Take 1 tablet (8.6 mg total) by mouth 2 (two) times daily. 120 tablet 0   simvastatin (ZOCOR) 40 MG tablet Take 1 tablet (40 mg total) by mouth at bedtime. 90 tablet 3    Review of Systems: a complete, 10pt review of systems was completed with pertinent positives and negatives as documented in the HPI  Physical Exam: Vitals:   07/28/22 1950 07/28/22 2137  BP: (!) 112/56 101/61  Pulse: 69 71  Resp: 18 17  Temp:  98.8 F (37.1 C)  SpO2: 96% 98%   Gen: A&Ox3, no distress  Eyes: lids and conjunctivae normal, no icterus. Pupils equally round and reactive to light.  Neck: supple without mass or thyromegaly Chest: respiratory effort is normal. No crepitus or tenderness on palpation of the chest. Breath sounds equal.  Cardiovascular: RRR with palpable distal pulses, no pedal edema*** Gastrointestinal: ***soft, nondistended, nontender. No mass, hepatomegaly or splenomegaly. No hernia. Lymphatic: no lymphadenopathy in the neck or groin Muscoloskeletal: no clubbing or cyanosis of the fingers.  Strength is symmetrical throughout.  Range of motion of bilateral upper and lower extremities normal without pain, crepitation or contracture. Neuro: cranial nerves grossly intact.  Sensation intact to light touch diffusely. Psych: appropriate mood and affect, normal insight/judgment intact  Skin: warm and dry      Latest Ref Rng & Units 07/28/2022    4:07 PM 07/28/2022     2:49 PM 07/21/2022    4:02 AM  CBC  WBC 4.0 - 10.5 K/uL  21.3  11.7   Hemoglobin 13.0 - 17.0 g/dL 9.9  9.5  10.4   Hematocrit 39.0 - 52.0 % 29.0  29.8  30.9   Platelets 150 - 400 K/uL  268  164        Latest Ref Rng & Units 07/28/2022    4:07 PM 07/28/2022    2:49 PM 07/21/2022    4:02 AM  CMP  Glucose 70 - 99 mg/dL 137  140  123   BUN 8 - 23 mg/dL >130  128  18   Creatinine 0.61 - 1.24 mg/dL 5.00  4.41  0.79   Sodium 135 - 145 mmol/L 134  136  139   Potassium 3.5 - 5.1 mmol/L 5.9  5.8  4.1  Chloride 98 - 111 mmol/L 105  104  104   CO2 22 - 32 mmol/L  20  27   Calcium 8.9 - 10.3 mg/dL  7.8  8.1   Total Protein 6.5 - 8.1 g/dL  5.9    Total Bilirubin 0.3 - 1.2 mg/dL  1.3    Alkaline Phos 38 - 126 U/L  99    AST 15 - 41 U/L  28    ALT 0 - 44 U/L  20      Lab Results  Component Value Date   INR 1.1 07/16/2022   INR 1.0 02/21/2008    Imaging: CT CHEST ABDOMEN PELVIS WO CONTRAST  Result Date: 07/28/2022 CLINICAL DATA:  Shortness of breath and abdominal pain, initial encounter EXAM: CT CHEST, ABDOMEN AND PELVIS WITHOUT CONTRAST TECHNIQUE: Multidetector CT imaging of the chest, abdomen and pelvis was performed following the standard protocol without IV contrast. RADIATION DOSE REDUCTION: This exam was performed according to the departmental dose-optimization program which includes automated exposure control, adjustment of the mA and/or kV according to patient size and/or use of iterative reconstruction technique. COMPARISON:  Chest x-ray from earlier in the same day. FINDINGS: CT CHEST FINDINGS Cardiovascular: Somewhat limited due to lack of IV contrast. Atherosclerotic calcifications of the thoracic aorta are noted. Decreased attenuation is noted in the cardiac blood pool suggestive of underlying anemia. Coronary calcifications are seen. Pulmonary artery as visualized is within normal limits. Mediastinum/Nodes: Thoracic inlet is unremarkable. No hilar or mediastinal adenopathy is  noted. The esophagus is within normal limits. Lungs/Pleura: Bibasilar atelectasis is noted. Mild inspissated material is noted within the right lower lobe bronchial tree which may represent aspiration. Mild emphysematous changes are seen. No parenchymal nodule is noted. Musculoskeletal: Degenerative changes of the thoracic spine are seen. CT ABDOMEN PELVIS FINDINGS Hepatobiliary: Gallbladder is well distended with dependent gallstones. Liver is within normal limits. Pancreas: Unremarkable. No pancreatic ductal dilatation or surrounding inflammatory changes. Spleen: Normal in size without focal abnormality. Adrenals/Urinary Tract: Adrenal glands are unremarkable. Kidneys demonstrate mild fullness of the collecting systems bilaterally although no significant ureteral dilatation is seen. Increased perinephric stranding is noted on the left. Significant decompression of the bladder is noted with some surrounding of the dilated loops of small bowel described below. Foley catheter is noted within the substance of the prostate. Stomach/Bowel: Scattered diverticular change of the colon is noted without evidence of diverticulitis. Multiple dilated loops of small bowel are noted involving the distal jejunum and proximal to mid ileum. The distal ileum appears within normal limits. A transition zone is noted deep in the pelvis best seen on image number 111 of series 3. The decompressed bladder surrounds the area of transition in the low pelvis. The stomach and proximal jejunum appear within normal limits. Vascular/Lymphatic: Aortic atherosclerosis. No enlarged abdominal or pelvic lymph nodes. Reproductive: Prostate is unremarkable. Other: Minimal free fluid is noted within the pelvis. Mild changes of anasarca are seen. Musculoskeletal: Degenerative changes of the lumbar spine are noted. No acute bony abnormality is noted. Changes of recent left hip fixation are seen. IMPRESSION: Multiple dilated fluid-filled loops of small  bowel involving the distal jejunum and proximal ileum. This is consistent with at least a partial small bowel obstruction. Transition zone is noted deep in the pelvis as described above likely related to adhesions. Foley catheter is noted within the substance of the prostate with significant decompression of the bladder identified. Cholelithiasis without complicating factors. Mild fullness of the collecting systems although no true hydronephrosis  is seen. Mild bibasilar atelectasis with inspissated material within the lower lobe bronchial tree on the right which may represent some aspiration. Electronically Signed   By: Inez Catalina M.D.   On: 07/28/2022 19:51   US Renal  Result Date: 07/28/2022 CLINICAL DATA:  Renal failure EXAM: RENAL / URINARY TRACT ULTRASOUND COMPLETE COMPARISON:  None Available. FINDINGS: Right Kidney: Renal measurements: 12.5 x 6.0 x 4.8 cm = volume: 189 mL. Right upper pole cyst 2 cm. Right lower pole cyst 2.5 cm. Echogenicity within normal limits. No mass or hydronephrosis visualized. Left Kidney: Renal measurements: 13.1 x 7.3 x 4.3 cm = volume: 213 mL. Left upper pole cyst 2.4 cm. Echogenicity within normal limits. No mass or hydronephrosis visualized. Bladder: Appears normal for degree of bladder distention. Other: None. IMPRESSION: Negative for hydronephrosis.  Bilateral renal cysts Electronically Signed   By: Franchot Gallo M.D.   On: 07/28/2022 18:22   DG Chest 1 View  Result Date: 07/28/2022 CLINICAL DATA:  Shortness of breath.  Bilateral leg swelling. EXAM: CHEST  1 VIEW COMPARISON:  AP chest 07/20/2022, 06/20/2022; chest two views 05/31/2022; CT chest 04/09/2010 FINDINGS: Cardiac silhouette is again mildly enlarged. There is again a tortuous thoracic aorta with mediastinal contours unchanged. There is again moderate right-sided volume loss, noting prior right lower lung partial resection on prior CT. Mild elevation of the right hemidiaphragm is unchanged. Mild right  basilar linear subsegmental atelectasis and/or scarring is similar to prior. The left lung is clear. No definite pleural effusion. No pneumothorax. 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 IMPRESSION: 1. Postsurgical changes of partial right lower lung resection, chronic. 2. Mild right basilar subsegmental atelectasis and/or scarring, similar to prior. No acute lung process is seen. Electronically Signed   By: Yvonne Kendall M.D.   On: 07/28/2022 15:48     A/P: 88 year old male who is 12 days status post surgical intervention for a hip fracture who presents with shortness of breath/new oxygen requirement, edema, and episodic orthostatic hypotension as well as acute renal failure.  I suspect that the findings noted on his CT scan reflect an ileus which is reactive to his underlying medical condition.  Given no nausea, vomiting would hold off on NG tube for now.  Monitor resuscitation and cardiac/renal function, follow bowel function, mobilize aggressively.    Patient Active Problem List   Diagnosis Date Noted   Closed fracture of left hip ([place]) 07/18/2022   Femur fracture, left ([place]) 07/16/2022   Cervical pain 07/16/2022   Impaired glucose tolerance 02/09/2014   Dyspnea 12/13/2012   Prostate cancer ([place]) 08/05/2012   Non-small cell lung cancer, Rt middle Lobe    INSOMNIA 12/07/2007   Dyslipidemia 06/28/2007   Essential hypertension 06/28/2007       Romana Juniper, MD [facility]  See AMION to contact appropriate on-call provider

## 2022-07-28 NOTE — ED Notes (Signed)
Patient transported to Ultrasound 

## 2022-07-29 DIAGNOSIS — C3490 Malignant neoplasm of unspecified part of unspecified bronchus or lung: Secondary | ICD-10-CM

## 2022-07-29 DIAGNOSIS — N179 Acute kidney failure, unspecified: Secondary | ICD-10-CM | POA: Diagnosis not present

## 2022-07-29 DIAGNOSIS — S72145A Nondisplaced intertrochanteric fracture of left femur, initial encounter for closed fracture: Secondary | ICD-10-CM

## 2022-07-29 DIAGNOSIS — I10 Essential (primary) hypertension: Secondary | ICD-10-CM | POA: Diagnosis not present

## 2022-07-29 DIAGNOSIS — K567 Ileus, unspecified: Secondary | ICD-10-CM | POA: Diagnosis not present

## 2022-07-29 DIAGNOSIS — J9601 Acute respiratory failure with hypoxia: Secondary | ICD-10-CM | POA: Diagnosis not present

## 2022-07-29 LAB — BASIC METABOLIC PANEL
Anion gap: 8 (ref 5–15)
BUN: 100 mg/dL — ABNORMAL HIGH (ref 8–23)
CO2: 27 mmol/L (ref 22–32)
Calcium: 8.1 mg/dL — ABNORMAL LOW (ref 8.9–10.3)
Chloride: 108 mmol/L (ref 98–111)
Creatinine, Ser: 2.24 mg/dL — ABNORMAL HIGH (ref 0.61–1.24)
GFR, Estimated: 28 mL/min — ABNORMAL LOW (ref >60)
Glucose, Bld: 105 mg/dL — ABNORMAL HIGH (ref 70–99)
Potassium: 5.2 mmol/L — ABNORMAL HIGH (ref 3.5–5.1)
Sodium: 143 mmol/L (ref 135–145)

## 2022-07-29 LAB — CBC
HCT: 28 % — ABNORMAL LOW (ref 39.0–52.0)
Hemoglobin: 9.5 g/dL — ABNORMAL LOW (ref 13.0–17.0)
MCH: 33.2 pg (ref 26.0–34.0)
MCHC: 33.9 g/dL (ref 30.0–36.0)
MCV: 97.9 fL (ref 80.0–100.0)
Platelets: 265 10*3/uL (ref 150–400)
RBC: 2.86 MIL/uL — ABNORMAL LOW (ref 4.22–5.81)
RDW: 11.8 % (ref 11.5–15.5)
WBC: 15.1 10*3/uL — ABNORMAL HIGH (ref 4.0–10.5)
nRBC: 0 % (ref 0.0–0.2)

## 2022-07-29 LAB — PHOSPHORUS: Phosphorus: 5.1 mg/dL — ABNORMAL HIGH (ref 2.5–4.6)

## 2022-07-29 LAB — MAGNESIUM: Magnesium: 3.3 mg/dL — ABNORMAL HIGH (ref 1.7–2.4)

## 2022-07-29 MED ORDER — LACTATED RINGERS IV SOLN: INTRAVENOUS | Status: DC

## 2022-07-29 MED ORDER — HYDROMORPHONE HCL 1 MG/ML IJ SOLN: 1.0000 mg | INTRAMUSCULAR | Status: DC | PRN

## 2022-07-29 MED ORDER — SODIUM CHLORIDE 0.45 % IV SOLN: INTRAVENOUS | Status: DC

## 2022-07-29 MED ORDER — SODIUM ZIRCONIUM CYCLOSILICATE 10 G PO PACK
10.0000 g | PACK | Freq: Two times a day (BID) | ORAL | Status: DC
  Administered 2022-07-29: 10 g via ORAL
  Filled 2022-07-29 (×2): qty 1

## 2022-07-29 NOTE — Assessment & Plan Note (Addendum)
Hyperkalemia, hyperphosphatemia, hyperNatremia, hyperchloremia.  Uremia on admission.   Documented urine output is 2.125 ml (post obstruction polyuria).  Peripheral edema has improved, his fluid balance since admission is negative -11,620 ml.  Patient is tolerating po well.   Follow up renal function today with serum cr at 0,74, K is 3,8 and serum bicarbonate at 29. Na 145 and CL 112. BUN down to 24.   Plan to hold on IV fluids today, he has been on D5W with good toleration. Follow up renal function and electrolytes in am.  Nutritional support.

## 2022-07-29 NOTE — Assessment & Plan Note (Addendum)
Clinically has resolved, patient is tolerating po well.

## 2022-07-29 NOTE — Hospital Course (Addendum)
Mr. Budlong was admitted to the hospital with the working diagnosis of AKI due to urinary retention in the setting of ileus.   88 yo male with the past medical history of hypertension, dyslipidemia, recent left hip fracture sp ORIF on 07/17/22, who presented with edema. Patient was discharged to SNF. At skilled nursing facility he was noted to have lower extremity edema and increased oxygen requirements. On his initial physical examination his 02 saturation was mid 90's on 2 L/min per [place], blood pressure 93/70, HR 77, RR 18. Lungs with no wheezing or rales, heart with S1 and S2 present and rhythmic, abdomen with no distention, positive lower extremity edema.   Na 136, K 5,8 Cl 104 bicarbonate 20 glcucose 140, bun 128 cr 4,41 BNP 166 High sensitive troponin 9  Wbc 21.3 hgb 9,5 plt 268   Urine analysis with SG 1,014, negative protein, >50 rbc, 0-5 wbc.   Chest radiograph with bibasilar atelectasis.   EKG 67 bpm, left axis deviation, left anterior fascicular block, sinus rhythm with no significant ST segment or T wave changes.   CT abdomen and pelvis with fluid filled loops of small bowel involving the distal jejunum and proximal ileum. Consistent with partial small bowel obstruction.  Foley catheter is noted within the substance of the prostate with significant decompression of the bladder.   Patient was placed on IV fluids.  Foley cathter was placed, obtaining 5.8 L of urine.   12/14 positive hematuria, foley catheter passing through prostate. Catheter was exchange with good toleration.  Renal function improving Worsening mentation, with delirium.  Code status changed to DNR.   12/15 patient with improvement in renal function and mentation.  12/16 mentation and renal function are improving, patient will need SNF at discharge.  12/17 discontinue IV fluids, hematuria has resolved. Pending transfer to SNF, will need outpatient follow up with urology.

## 2022-07-29 NOTE — Assessment & Plan Note (Addendum)
Multifactorial, likely related to abdominal distention and bibasilar atelectasis.  Today documented 02 saturation 97% on room air at rest.   Continue as needed supplemental 02 per [place] to keep 02 saturation 92% or greater.  Incentive spirometer.

## 2022-07-29 NOTE — Assessment & Plan Note (Signed)
Sp resection right VATS, in 2009

## 2022-07-29 NOTE — ED Provider Notes (Signed)
Physical Exam  BP 96/66   Pulse 76   Temp 98.8 F (37.1 C) (Oral)   Resp (!) 22   SpO2 96%   Physical Exam Vitals and nursing note reviewed.  Constitutional:      Appearance: He is not toxic-appearing.  Cardiovascular:     Rate and Rhythm: Normal rate.  Pulmonary:     Effort: Pulmonary effort is normal.     Breath sounds: Examination of the right-lower field reveals rales. Examination of the left-lower field reveals rales. Rales present.  Abdominal:     Palpations: Abdomen is soft.     Tenderness: There is abdominal tenderness.     Comments: Mild lower abdominal tenderness to palpation. Soft. No guarding or rebound.   Musculoskeletal:     Right lower leg: Edema present.     Left lower leg: Edema present.     Comments: The patient has pitting edema going into the lower abdomen. He has bruising noted to the lateral aspect of the left hip. I removed the bandages. Wound and steri strips intact. No surrounding erythema, induration, or fluctuance. He does has pitting edema surrounding the wound, but this is also present throughout the leg.   Neurological:     Mental Status: He is alert.     Procedures  Procedures  ED Course / MDM    Medical Decision Making Amount and/or Complexity of Data Reviewed Labs: ordered. Radiology: ordered.  Risk Prescription drug management. Decision regarding hospitalization.   Accepted handoff at shift change from [facility], [place]. Please see prior provider note for more detail.   Briefly: Patient is 88 y.o. M presenting with 19 pound weight gain in shortly over a week as well as SOB.   DDX: concern for intrathoracic/intrabdominal infection, volume overload, AKI  Plan: Follow up on imaging. Will need admission given elevated Cr.   CT imaging shows multiple dilated fluid-filled loops of small bowel involving the distal jejunum and proximal ileum. This is consistent with at least a partial small bowel obstruction. Transition zone is noted  deep in the pelvis as described above likely related to adhesions. Foley catheter is noted within the substance of the prostate with significant decompression of the bladder identified. Cholelithiasis without complicating factors. Mild fullness of the collecting systems although no true hydronephrosis is seen. Mild bibasilar atelectasis with inspissated material within the lower lobe bronchial tree on the right which may represent some aspiration.  I spoke with Dr. Marlou Sa with [facility] who recommends keeping the patient NPO, but does not recommend any NG tube as he does not have any nausea or vomiting. She thinks this may be ileus from his new renal failure. She will follow up with him tomorrow when he is more optimized.   Unsure why the WBC is so elevated, could be stress reaction. The patient was started on cefepime from the previous shift.   Given the patient's AKI and volume overload, he will need to be admitted.   Admitted to Dr. Myna Hidalgo.     Results for orders placed or performed during the hospital encounter of 07/28/22  CBC with Differential  Result Value Ref Range   WBC 21.3 (H) 4.0 - 10.5 K/uL   RBC 2.92 (L) 4.22 - 5.81 MIL/uL   Hemoglobin 9.5 (L) 13.0 - 17.0 g/dL   HCT 29.8 (L) 39.0 - 52.0 %   MCV 102.1 (H) 80.0 - 100.0 fL   MCH 32.5 26.0 - 34.0 pg   MCHC 31.9 30.0 - 36.0 g/dL  RDW 11.9 11.5 - 15.5 %   Platelets 268 150 - 400 K/uL   nRBC 0.0 0.0 - 0.2 %   Neutrophils Relative % 92 %   Neutro Abs 19.6 (H) 1.7 - 7.7 K/uL   Lymphocytes Relative 1 %   Lymphs Abs 0.3 (L) 0.7 - 4.0 K/uL   Monocytes Relative 6 %   Monocytes Absolute 1.3 (H) 0.1 - 1.0 K/uL   Eosinophils Relative 0 %   Eosinophils Absolute 0.0 0.0 - 0.5 K/uL   Basophils Relative 0 %   Basophils Absolute 0.0 0.0 - 0.1 K/uL   Immature Granulocytes 1 %   Abs Immature Granulocytes 0.14 (H) 0.00 - 0.07 K/uL  Comprehensive metabolic panel  Result Value Ref Range   Sodium 136 135 - 145 mmol/L   Potassium 5.8 (H)  3.5 - 5.1 mmol/L   Chloride 104 98 - 111 mmol/L   CO2 20 (L) 22 - 32 mmol/L   Glucose, Bld 140 (H) 70 - 99 mg/dL   BUN 128 (H) 8 - 23 mg/dL   Creatinine, Ser 4.41 (H) 0.61 - 1.24 mg/dL   Calcium 7.8 (L) 8.9 - 10.3 mg/dL   Total Protein 5.9 (L) 6.5 - 8.1 g/dL   Albumin 2.3 (L) 3.5 - 5.0 g/dL   AST 28 15 - 41 U/L   ALT 20 0 - 44 U/L   Alkaline Phosphatase 99 38 - 126 U/L   Total Bilirubin 1.3 (H) 0.3 - 1.2 mg/dL   GFR, Estimated 12 (L) >60 mL/min   Anion gap 12 5 - 15  Brain natriuretic peptide  Result Value Ref Range   B Natriuretic Peptide 166.5 (H) 0.0 - 100.0 pg/mL  Urinalysis, Routine w reflex microscopic Urine, Catheterized  Result Value Ref Range   Color, Urine YELLOW YELLOW   APPearance HAZY (A) CLEAR   Specific Gravity, Urine 1.014 1.005 - 1.030   pH 5.0 5.0 - 8.0   Glucose, UA NEGATIVE NEGATIVE mg/dL   Hgb urine dipstick LARGE (A) NEGATIVE   Bilirubin Urine NEGATIVE NEGATIVE   Ketones, ur NEGATIVE NEGATIVE mg/dL   Protein, ur NEGATIVE NEGATIVE mg/dL   Nitrite NEGATIVE NEGATIVE   Leukocytes,Ua NEGATIVE NEGATIVE   RBC / HPF >50 (H) 0 - 5 RBC/hpf   WBC, UA 0-5 0 - 5 WBC/hpf   Bacteria, UA NONE SEEN NONE SEEN  Lactic acid, plasma  Result Value Ref Range   Lactic Acid, Venous 1.5 0.5 - 1.9 mmol/L  I-stat chem 8, ED  Result Value Ref Range   Sodium 134 (L) 135 - 145 mmol/L   Potassium 5.9 (H) 3.5 - 5.1 mmol/L   Chloride 105 98 - 111 mmol/L   BUN >130 (H) 8 - 23 mg/dL   Creatinine, Ser 5.00 (H) 0.61 - 1.24 mg/dL   Glucose, Bld 137 (H) 70 - 99 mg/dL   Calcium, Ion 0.96 (L) 1.15 - 1.40 mmol/L   TCO2 22 22 - 32 mmol/L   Hemoglobin 9.9 (L) 13.0 - 17.0 g/dL   HCT 29.0 (L) 39.0 - 52.0 %  Troponin I (High Sensitivity)  Result Value Ref Range   Troponin I (High Sensitivity) 9 <18 ng/L  Troponin I (High Sensitivity)  Result Value Ref Range   Troponin I (High Sensitivity) 11 <18 ng/L   CT CHEST ABDOMEN PELVIS WO CONTRAST  Result Date: 07/28/2022 CLINICAL DATA:   Shortness of breath and abdominal pain, initial encounter EXAM: CT CHEST, ABDOMEN AND PELVIS WITHOUT CONTRAST TECHNIQUE: Multidetector CT imaging of the  chest, abdomen and pelvis was performed following the standard protocol without IV contrast. RADIATION DOSE REDUCTION: This exam was performed according to the departmental dose-optimization program which includes automated exposure control, adjustment of the mA and/or kV according to patient size and/or use of iterative reconstruction technique. COMPARISON:  Chest x-ray from earlier in the same day. FINDINGS: CT CHEST FINDINGS Cardiovascular: Somewhat limited due to lack of IV contrast. Atherosclerotic calcifications of the thoracic aorta are noted. Decreased attenuation is noted in the cardiac blood pool suggestive of underlying anemia. Coronary calcifications are seen. Pulmonary artery as visualized is within normal limits. Mediastinum/Nodes: Thoracic inlet is unremarkable. No hilar or mediastinal adenopathy is noted. The esophagus is within normal limits. Lungs/Pleura: Bibasilar atelectasis is noted. Mild inspissated material is noted within the right lower lobe bronchial tree which may represent aspiration. Mild emphysematous changes are seen. No parenchymal nodule is noted. Musculoskeletal: Degenerative changes of the thoracic spine are seen. CT ABDOMEN PELVIS FINDINGS Hepatobiliary: Gallbladder is well distended with dependent gallstones. Liver is within normal limits. Pancreas: Unremarkable. No pancreatic ductal dilatation or surrounding inflammatory changes. Spleen: Normal in size without focal abnormality. Adrenals/Urinary Tract: Adrenal glands are unremarkable. Kidneys demonstrate mild fullness of the collecting systems bilaterally although no significant ureteral dilatation is seen. Increased perinephric stranding is noted on the left. Significant decompression of the bladder is noted with some surrounding of the dilated loops of small bowel described  below. Foley catheter is noted within the substance of the prostate. Stomach/Bowel: Scattered diverticular change of the colon is noted without evidence of diverticulitis. Multiple dilated loops of small bowel are noted involving the distal jejunum and proximal to mid ileum. The distal ileum appears within normal limits. A transition zone is noted deep in the pelvis best seen on image number 111 of series 3. The decompressed bladder surrounds the area of transition in the low pelvis. The stomach and proximal jejunum appear within normal limits. Vascular/Lymphatic: Aortic atherosclerosis. No enlarged abdominal or pelvic lymph nodes. Reproductive: Prostate is unremarkable. Other: Minimal free fluid is noted within the pelvis. Mild changes of anasarca are seen. Musculoskeletal: Degenerative changes of the lumbar spine are noted. No acute bony abnormality is noted. Changes of recent left hip fixation are seen. IMPRESSION: Multiple dilated fluid-filled loops of small bowel involving the distal jejunum and proximal ileum. This is consistent with at least a partial small bowel obstruction. Transition zone is noted deep in the pelvis as described above likely related to adhesions. Foley catheter is noted within the substance of the prostate with significant decompression of the bladder identified. Cholelithiasis without complicating factors. Mild fullness of the collecting systems although no true hydronephrosis is seen. Mild bibasilar atelectasis with inspissated material within the lower lobe bronchial tree on the right which may represent some aspiration. Electronically Signed   By: Inez Catalina M.D.   On: 07/28/2022 19:51   US Renal  Result Date: 07/28/2022 CLINICAL DATA:  Renal failure EXAM: RENAL / URINARY TRACT ULTRASOUND COMPLETE COMPARISON:  None Available. FINDINGS: Right Kidney: Renal measurements: 12.5 x 6.0 x 4.8 cm = volume: 189 mL. Right upper pole cyst 2 cm. Right lower pole cyst 2.5 cm. Echogenicity  within normal limits. No mass or hydronephrosis visualized. Left Kidney: Renal measurements: 13.1 x 7.3 x 4.3 cm = volume: 213 mL. Left upper pole cyst 2.4 cm. Echogenicity within normal limits. No mass or hydronephrosis visualized. Bladder: Appears normal for degree of bladder distention. Other: None. IMPRESSION: Negative for hydronephrosis.  Bilateral renal cysts Electronically  Signed   By: Franchot Gallo M.D.   On: 07/28/2022 18:22   DG Chest 1 View  Result Date: 07/28/2022 CLINICAL DATA:  Shortness of breath.  Bilateral leg swelling. EXAM: CHEST  1 VIEW COMPARISON:  AP chest 07/20/2022, 06/20/2022; chest two views 05/31/2022; CT chest 04/09/2010 FINDINGS: Cardiac silhouette is again mildly enlarged. There is again a tortuous thoracic aorta with mediastinal contours unchanged. There is again moderate right-sided volume loss, noting prior right lower lung partial resection on prior CT. Mild elevation of the right hemidiaphragm is unchanged. Mild right basilar linear subsegmental atelectasis and/or scarring is similar to prior. The left lung is clear. No definite pleural effusion. No pneumothorax. 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 IMPRESSION: 1. Postsurgical changes of partial right lower lung resection, chronic. 2. Mild right basilar subsegmental atelectasis and/or scarring, similar to prior. No acute lung process is seen. Electronically Signed   By: Yvonne Kendall M.D.   On: 07/28/2022 15:48       Sherrell Puller, PA-C 07/29/22 [id number]    Regan Lemming, MD 07/29/22 [phone]

## 2022-07-29 NOTE — ED Notes (Signed)
ED TO INPATIENT HANDOFF REPORT  ED Nurse Name and Phone #: [phone]  S Name/Age/Gender John Riley 88 y.o. male Room/Bed: 009C/009C  Code Status   Code Status: Full Code  Home/SNF/Other Nursing Home  Is this baseline? Yes   Triage Complete: Triage complete  Chief Complaint AKI (acute kidney injury) ([place]) [N17.9]  Triage Note No notes on file   Allergies Allergies  Allergen Reactions   Niacin     Level of Care/Admitting Diagnosis ED Disposition     ED Disposition  Admit   Condition  --   [place]: [place] [[id number]]  Level of Care: Telemetry Medical [[id number]]  May admit patient to [facility] or [facility] if equivalent level of care is available:: No  Covid Evaluation: Asymptomatic - no recent exposure (last 10 days) testing not required  Diagnosis: AKI (acute kidney injury) [facility]) [[id number]]  Admitting Physician: Vianne Bulls [[id number]]  Attending Physician: Vianne Bulls [[id number]]  Certification:: I certify this patient will need inpatient services for at least 2 midnights  Estimated Length of Stay: 3          B Medical/Surgery History Past Medical History:  Diagnosis Date   ELEVATED PROSTATE SPECIFIC ANTIGEN 07/31/2009   HYPERLIPIDEMIA 06/28/2007   HYPERTENSION 06/28/2007   INSOMNIA 12/07/2007   Non-small cell lung cancer ([place]) 2009   PULMONARY NODULE, RIGHT MIDDLE LOBE 12/07/2007   TOBACCO ABUSE 11/09/2007   Past Surgical History:  Procedure Laterality Date   APPENDECTOMY     CATARACT EXTRACTION Left 10/30/2021   INTRAMEDULLARY (IM) NAIL INTERTROCHANTERIC Left 07/17/2022   Procedure: INTRAMEDULLARY (IM) NAIL INTERTROCHANTERIC;  Surgeon: Meredith Pel, MD;  Location: [facility];  Service: Orthopedics;  Laterality: Left;   LOBECTOMY     right middle   PROSTATE SURGERY     bx   VEIN LIGATION AND STRIPPING Left 1974     A IV Location/Drains/Wounds Patient Lines/Drains/Airways Status     Active  Line/Drains/Airways     Name Placement date Placement time Site Days   Peripheral IV 07/28/22 20 G Left Antecubital 07/28/22  1432  Antecubital  1   Urethral Catheter JS Straight-tip 16 Fr. 07/28/22  1654  Straight-tip  1   Airway 7.5 mm 07/17/22  1637  -- 12   Incision (Closed) 07/17/22 Hip Left 07/17/22  1802  -- 12            Intake/Output Last 24 hours  Intake/Output Summary (Last 24 hours) at 07/29/2022 1424 Last data filed at 07/29/2022 0620 Gross per 24 hour  Intake --  Output 5800 ml  Net -5800 ml    Labs/Imaging Results for orders placed or performed during the hospital encounter of 07/28/22 (from the past 48 hour(s))  CBC with Differential     Status: Abnormal   Collection Time: 07/28/22  2:49 PM  Result Value Ref Range   WBC 21.3 (H) 4.0 - 10.5 K/uL   RBC 2.92 (L) 4.22 - 5.81 MIL/uL   Hemoglobin 9.5 (L) 13.0 - 17.0 g/dL   HCT 29.8 (L) 39.0 - 52.0 %   MCV 102.1 (H) 80.0 - 100.0 fL   MCH 32.5 26.0 - 34.0 pg   MCHC 31.9 30.0 - 36.0 g/dL   RDW 11.9 11.5 - 15.5 %   Platelets 268 150 - 400 K/uL   nRBC 0.0 0.0 - 0.2 %   Neutrophils Relative % 92 %   Neutro Abs 19.6 (H) 1.7 - 7.7 K/uL  Lymphocytes Relative 1 %   Lymphs Abs 0.3 (L) 0.7 - 4.0 K/uL   Monocytes Relative 6 %   Monocytes Absolute 1.3 (H) 0.1 - 1.0 K/uL   Eosinophils Relative 0 %   Eosinophils Absolute 0.0 0.0 - 0.5 K/uL   Basophils Relative 0 %   Basophils Absolute 0.0 0.0 - 0.1 K/uL   Immature Granulocytes 1 %   Abs Immature Granulocytes 0.14 (H) 0.00 - 0.07 K/uL    Comment: Performed at [facility] [address], [place], [place] [address]  Comprehensive metabolic panel     Status: Abnormal   Collection Time: 07/28/22  2:49 PM  Result Value Ref Range   Sodium 136 135 - 145 mmol/L   Potassium 5.8 (H) 3.5 - 5.1 mmol/L   Chloride 104 98 - 111 mmol/L   CO2 20 (L) 22 - 32 mmol/L   Glucose, Bld 140 (H) 70 - 99 mg/dL    Comment: Glucose reference range applies only to samples taken after  fasting for at least 8 hours.   BUN 128 (H) 8 - 23 mg/dL   Creatinine, Ser 4.41 (H) 0.61 - 1.24 mg/dL   Calcium 7.8 (L) 8.9 - 10.3 mg/dL   Total Protein 5.9 (L) 6.5 - 8.1 g/dL   Albumin 2.3 (L) 3.5 - 5.0 g/dL   AST 28 15 - 41 U/L   ALT 20 0 - 44 U/L   Alkaline Phosphatase 99 38 - 126 U/L   Total Bilirubin 1.3 (H) 0.3 - 1.2 mg/dL   GFR, Estimated 12 (L) >60 mL/min    Comment: (NOTE) Calculated using the CKD-EPI Creatinine Equation (2021)    Anion gap 12 5 - 15    Comment: Performed at [facility], [place] [address] [place], [place] [address]  Brain natriuretic peptide     Status: Abnormal   Collection Time: 07/28/22  2:49 PM  Result Value Ref Range   B Natriuretic Peptide 166.5 (H) 0.0 - 100.0 pg/mL    Comment: Performed at [facility] [address], [place], [place] [address]  Troponin I (High Sensitivity)     Status: None   Collection Time: 07/28/22  2:49 PM  Result Value Ref Range   Troponin I (High Sensitivity) 9 <18 ng/L    Comment: (NOTE) Elevated high sensitivity troponin I (hsTnI) values and significant  changes across serial measurements may suggest ACS but many other  chronic and acute conditions are known to elevate hsTnI results.  Refer to the "Links" section for chest pain algorithms and additional  guidance. Performed at [facility], [place] [address] [place], [place] [address]   Lactic acid, plasma     Status: None   Collection Time: 07/28/22  4:00 PM  Result Value Ref Range   Lactic Acid, Venous 1.5 0.5 - 1.9 mmol/L    Comment: Performed at [facility] [address], [place],  [address]  I-stat chem 8, ED     Status: Abnormal   Collection Time: 07/28/22  4:07 PM  Result Value Ref Range   Sodium 134 (L) 135 - 145 mmol/L   Potassium 5.9 (H) 3.5 - 5.1 mmol/L   Chloride 105 98 - 111 mmol/L   BUN >130 (H) 8 - 23 mg/dL   Creatinine, Ser 5.00 (H) 0.61 - 1.24 mg/dL   Glucose, Bld 137 (H) 70 - 99 mg/dL    Comment: Glucose  reference range applies only to samples taken after  fasting for at least 8 hours.   Calcium, Ion 0.96 (L) 1.15 - 1.40 mmol/L   TCO2 22 22 - 32 mmol/L   Hemoglobin 9.9 (L) 13.0 - 17.0 g/dL   HCT 29.0 (L) 39.0 - 52.0 %  Troponin I (High Sensitivity)     Status: None   Collection Time: 07/28/22  4:21 PM  Result Value Ref Range   Troponin I (High Sensitivity) 11 <18 ng/L    Comment: (NOTE) Elevated high sensitivity troponin I (hsTnI) values and significant  changes across serial measurements may suggest ACS but many other  chronic and acute conditions are known to elevate hsTnI results.  Refer to the "Links" section for chest pain algorithms and additional  guidance. Performed at [facility], [place] [address], [place], [place] [address]   Blood culture (routine x 2)     Status: None (Preliminary result)   Collection Time: 07/28/22  4:31 PM   Specimen: BLOOD RIGHT HAND  Result Value Ref Range   Specimen Description BLOOD RIGHT HAND    Special Requests      BOTTLES DRAWN AEROBIC ONLY Blood Culture results may not be optimal due to an inadequate volume of blood received in culture bottles   Culture      NO GROWTH < 24 HOURS Performed at [facility] [address] [place], [place] [address]    Report Status PENDING   Urinalysis, Routine w reflex microscopic Urine, Catheterized     Status: Abnormal   Collection Time: 07/28/22  5:07 PM  Result Value Ref Range   Color, Urine YELLOW YELLOW   APPearance HAZY (A) CLEAR   Specific Gravity, Urine 1.014 1.005 - 1.030   pH 5.0 5.0 - 8.0   Glucose, UA NEGATIVE NEGATIVE mg/dL   Hgb urine dipstick LARGE (A) NEGATIVE   Bilirubin Urine NEGATIVE NEGATIVE   Ketones, ur NEGATIVE NEGATIVE mg/dL   Protein, ur NEGATIVE NEGATIVE mg/dL   Nitrite NEGATIVE NEGATIVE   Leukocytes,Ua NEGATIVE NEGATIVE   RBC / HPF >50 (H) 0 - 5 RBC/hpf   WBC, UA 0-5 0 - 5 WBC/hpf   Bacteria, UA NONE SEEN NONE SEEN    Comment: Performed at [facility] [address], [place], [place] [address]  Blood culture (routine x 2)     Status: None (Preliminary result)   Collection Time: 07/28/22  6:50 PM   Specimen: BLOOD  Result Value Ref Range   Specimen Description BLOOD SITE NOT SPECIFIED    Special Requests      BOTTLES DRAWN AEROBIC AND ANAEROBIC Blood Culture results may not be optimal due to an inadequate volume of blood received in culture bottles   Culture      NO GROWTH < 24 HOURS Performed at [facility], [place] [address] [place], [place] [address]    Report Status PENDING   Basic metabolic panel     Status: Abnormal   Collection Time: 07/29/22  5:30 AM  Result Value Ref Range   Sodium 143 135 - 145 mmol/L    Comment: DELTA CHECK NOTED   Potassium 5.2 (H) 3.5 - 5.1 mmol/L   Chloride 108 98 - 111 mmol/L   CO2 27 22 - 32 mmol/L   Glucose, Bld 105 (H) 70 - 99 mg/dL    Comment: Glucose reference range applies only to samples taken after fasting for at least 8 hours.   BUN 100 (H) 8 - 23 mg/dL   Creatinine, Ser  2.24 (H) 0.61 - 1.24 mg/dL   Calcium 8.1 (L) 8.9 - 10.3 mg/dL   GFR, Estimated 28 (L) >60 mL/min    Comment: (NOTE) Calculated using the CKD-EPI Creatinine Equation (2021)    Anion gap 8 5 - 15    Comment: Performed at [facility] [address], [place], [place] [address]  Magnesium     Status: Abnormal   Collection Time: 07/29/22  5:30 AM  Result Value Ref Range   Magnesium 3.3 (H) 1.7 - 2.4 mg/dL    Comment: Performed at [facility] [address] [place], [place] [address]  Phosphorus     Status: Abnormal   Collection Time: 07/29/22  5:30 AM  Result Value Ref Range   Phosphorus 5.1 (H) 2.5 - 4.6 mg/dL    Comment: Performed at [facility] [address] [place], [place] [address]  CBC     Status: Abnormal   Collection Time: 07/29/22  5:30 AM  Result Value Ref Range   WBC 15.1 (H) 4.0 - 10.5 K/uL   RBC 2.86 (L) 4.22 - 5.81 MIL/uL   Hemoglobin 9.5 (L) 13.0 - 17.0 g/dL   HCT 28.0  (L) 39.0 - 52.0 %   MCV 97.9 80.0 - 100.0 fL   MCH 33.2 26.0 - 34.0 pg   MCHC 33.9 30.0 - 36.0 g/dL   RDW 11.8 11.5 - 15.5 %   Platelets 265 150 - 400 K/uL   nRBC 0.0 0.0 - 0.2 %    Comment: Performed at [facility], [place] [address] [place], [place] [address]   CT CHEST ABDOMEN PELVIS WO CONTRAST  Result Date: 07/28/2022 CLINICAL DATA:  Shortness of breath and abdominal pain, initial encounter EXAM: CT CHEST, ABDOMEN AND PELVIS WITHOUT CONTRAST TECHNIQUE: Multidetector CT imaging of the chest, abdomen and pelvis was performed following the standard protocol without IV contrast. RADIATION DOSE REDUCTION: This exam was performed according to the departmental dose-optimization program which includes automated exposure control, adjustment of the mA and/or kV according to patient size and/or use of iterative reconstruction technique. COMPARISON:  Chest x-ray from earlier in the same day. FINDINGS: CT CHEST FINDINGS Cardiovascular: Somewhat limited due to lack of IV contrast. Atherosclerotic calcifications of the thoracic aorta are noted. Decreased attenuation is noted in the cardiac blood pool suggestive of underlying anemia. Coronary calcifications are seen. Pulmonary artery as visualized is within normal limits. Mediastinum/Nodes: Thoracic inlet is unremarkable. No hilar or mediastinal adenopathy is noted. The esophagus is within normal limits. Lungs/Pleura: Bibasilar atelectasis is noted. Mild inspissated material is noted within the right lower lobe bronchial tree which may represent aspiration. Mild emphysematous changes are seen. No parenchymal nodule is noted. Musculoskeletal: Degenerative changes of the thoracic spine are seen. CT ABDOMEN PELVIS FINDINGS Hepatobiliary: Gallbladder is well distended with dependent gallstones. Liver is within normal limits. Pancreas: Unremarkable. No pancreatic ductal dilatation or surrounding inflammatory changes. Spleen: Normal in size without focal  abnormality. Adrenals/Urinary Tract: Adrenal glands are unremarkable. Kidneys demonstrate mild fullness of the collecting systems bilaterally although no significant ureteral dilatation is seen. Increased perinephric stranding is noted on the left. Significant decompression of the bladder is noted with some surrounding of the dilated loops of small bowel described below. Foley catheter is noted within the substance of the prostate. Stomach/Bowel: Scattered diverticular change of the colon is noted without evidence of diverticulitis. Multiple dilated loops of small bowel are noted involving the distal jejunum and proximal to mid ileum.  The distal ileum appears within normal limits. A transition zone is noted deep in the pelvis best seen on image number 111 of series 3. The decompressed bladder surrounds the area of transition in the low pelvis. The stomach and proximal jejunum appear within normal limits. Vascular/Lymphatic: Aortic atherosclerosis. No enlarged abdominal or pelvic lymph nodes. Reproductive: Prostate is unremarkable. Other: Minimal free fluid is noted within the pelvis. Mild changes of anasarca are seen. Musculoskeletal: Degenerative changes of the lumbar spine are noted. No acute bony abnormality is noted. Changes of recent left hip fixation are seen. IMPRESSION: Multiple dilated fluid-filled loops of small bowel involving the distal jejunum and proximal ileum. This is consistent with at least a partial small bowel obstruction. Transition zone is noted deep in the pelvis as described above likely related to adhesions. Foley catheter is noted within the substance of the prostate with significant decompression of the bladder identified. Cholelithiasis without complicating factors. Mild fullness of the collecting systems although no true hydronephrosis is seen. Mild bibasilar atelectasis with inspissated material within the lower lobe bronchial tree on the right which may represent some aspiration.  Electronically Signed   By: Inez Catalina M.D.   On: 07/28/2022 19:51   US Renal  Result Date: 07/28/2022 CLINICAL DATA:  Renal failure EXAM: RENAL / URINARY TRACT ULTRASOUND COMPLETE COMPARISON:  None Available. FINDINGS: Right Kidney: Renal measurements: 12.5 x 6.0 x 4.8 cm = volume: 189 mL. Right upper pole cyst 2 cm. Right lower pole cyst 2.5 cm. Echogenicity within normal limits. No mass or hydronephrosis visualized. Left Kidney: Renal measurements: 13.1 x 7.3 x 4.3 cm = volume: 213 mL. Left upper pole cyst 2.4 cm. Echogenicity within normal limits. No mass or hydronephrosis visualized. Bladder: Appears normal for degree of bladder distention. Other: None. IMPRESSION: Negative for hydronephrosis.  Bilateral renal cysts Electronically Signed   By: Franchot Gallo M.D.   On: 07/28/2022 18:22   DG Chest 1 View  Result Date: 07/28/2022 CLINICAL DATA:  Shortness of breath.  Bilateral leg swelling. EXAM: CHEST  1 VIEW COMPARISON:  AP chest 07/20/2022, 06/20/2022; chest two views 05/31/2022; CT chest 04/09/2010 FINDINGS: Cardiac silhouette is again mildly enlarged. There is again a tortuous thoracic aorta with mediastinal contours unchanged. There is again moderate right-sided volume loss, noting prior right lower lung partial resection on prior CT. Mild elevation of the right hemidiaphragm is unchanged. Mild right basilar linear subsegmental atelectasis and/or scarring is similar to prior. The left lung is clear. No definite pleural effusion. No pneumothorax. 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 IMPRESSION: 1. Postsurgical changes of partial right lower lung resection, chronic. 2. Mild right basilar subsegmental atelectasis and/or scarring, similar to prior. No acute lung process is seen. Electronically Signed   By: Yvonne Kendall M.D.   On: 07/28/2022 15:48    Pending Labs Unresulted Labs (From admission, onward)     Start     Ordered   07/29/22 9163  Basic metabolic panel  Daily,    R      07/28/22 2256   07/29/22 0500  CBC  Daily,   R      07/28/22 2256            Vitals/Pain Today's Vitals   07/29/22 0845 07/29/22 0900 07/29/22 1000 07/29/22 1100  BP: 102/60 (!) 99/59 104/78 119/69  Pulse: 78 83 82 79  Resp: 19 (!) 23 (!) 22 18  Temp:    98.2 F (36.8 C)  TempSrc:    Oral  SpO2: 100% 97% 99% 96%  PainSc:        Isolation Precautions No active isolations  Medications Medications  simvastatin (ZOCOR) tablet 40 mg (40 mg Oral Given 07/28/22 2314)  heparin injection 5,000 Units (5,000 Units Subcutaneous Given 07/29/22 1348)  sodium chloride flush (NS) 0.9 % injection 3 mL (3 mLs Intravenous Not Given 07/29/22 0845)  acetaminophen (TYLENOL) tablet 650 mg (has no administration in time range)    Or  acetaminophen (TYLENOL) suppository 650 mg (has no administration in time range)  ondansetron (ZOFRAN) tablet 4 mg (has no administration in time range)    Or  ondansetron (ZOFRAN) injection 4 mg (has no administration in time range)  sodium zirconium cyclosilicate (LOKELMA) packet 10 g (has no administration in time range)  lactated ringers infusion ( Intravenous New Bag/Given 07/29/22 1353)  sodium zirconium cyclosilicate (LOKELMA) packet 10 g (10 g Oral Given 07/28/22 1848)  sodium chloride 0.9 % bolus 500 mL (0 mLs Intravenous Stopped 07/28/22 2052)    Mobility non-ambulatory High fall risk   Focused Assessments Cardiac Assessment Handoff:  Cardiac Rhythm: Normal sinus rhythm No results found for: "CKTOTAL", "CKMB", "CKMBINDEX", "TROPONINI" No results found for: "DDIMER" Does the Patient currently have chest pain? No    R Recommendations: See Admitting Provider Note  Report given to:   Additional Notes:

## 2022-07-29 NOTE — Progress Notes (Signed)
Admit: 07/28/2022 LOS: 1  28M AKI after recent admission for L hip fracture  Subjective:  Made 5.8L UOP after foley placed Imaging ([place]) did not suggest obstruction  - seems foley hadn't been placed yet Now having gross hematuria, 12/1 had UA w/o Hb,  ? Foley trauma Has hx/o prostate Ca SCr much improved to 2.2, BUN down to 100, K 5.2 Surgery following for CT suggestive of obstruction, clinically more like an ileus, no surgical plans  12/12 0701 - 12/13 0700 In: -  Out: 5800 [Urine:5800]  There were no vitals filed for this visit.  Scheduled Meds:  heparin  5,000 Units Subcutaneous Q8H   simvastatin  40 mg Oral QHS   sodium chloride flush  3 mL Intravenous Q12H   sodium zirconium cyclosilicate  10 g Oral BID   Continuous Infusions:  sodium chloride Stopped (07/29/22 0852)   PRN Meds:.acetaminophen **OR** acetaminophen, ondansetron **OR** ondansetron (ZOFRAN) IV  Current Labs: reviewed   Physical Exam:  Blood pressure 119/69, pulse 79, temperature 98.2 F (36.8 C), temperature source Oral, resp. rate 18, SpO2 96 %. Elderly male, chronically ill appearing Not fully coherent, some disorientation RRR CTAB 2-3+ LEE S/nt/nd  A AKI:   clinically suggestive of pre-renal but 5.8L UOP afte foley suggests was obstruction.  Imaging did not  fully confirm this.  CT 'mild fullness of collecting system' Having grossly blood urine now which wasn't present on UA earlier this mo.  Trend to see if clears Keep foley for now Cont IVFs given rubust UOP, change to LR 49mL/h Hyperkalemia, improved.  On Lokelma move to BID Dyspnea, CT ? Aspiration Hx/o prostate Ca, story unclear Recent L femur fracture s/p repair Azotemia, improving Leukocytosis, better today; BCx pending Hx/o NSCLC  P As above Medication Issues; Preferred narcotic agents for pain control are hydromorphone, fentanyl, and methadone. Morphine should not be used.  Baclofen should be avoided Avoid oral sodium phosphate  and magnesium citrate based laxatives / bowel preps    Pearson Grippe MD 07/29/2022, 12:41 PM  Recent Labs  Lab 07/28/22 1449 07/28/22 1607 07/29/22 0530  NA 136 134* 143  K 5.8* 5.9* 5.2*  CL 104 105 108  CO2 20*  --  27  GLUCOSE 140* 137* 105*  BUN 128* >130* 100*  CREATININE 4.41* 5.00* 2.24*  CALCIUM 7.8*  --  8.1*  PHOS  --   --  5.1*   Recent Labs  Lab 07/28/22 1449 07/28/22 1607 07/29/22 0530  WBC 21.3*  --  15.1*  NEUTROABS 19.6*  --   --   HGB 9.5* 9.9* 9.5*  HCT 29.8* 29.0* 28.0*  MCV 102.1*  --  97.9  PLT 268  --  265

## 2022-07-29 NOTE — Progress Notes (Addendum)
Progress Note   Patient: John Riley TDD:[id number] DOB: 1932-01-14 DOA: 07/28/2022     1 DOS: the patient was seen and examined on 07/29/2022   Brief hospital course: John Riley was admitted to the hospital with the working diagnosis of AKI   88 yo male with the past medical history of hypertension, dyslipidemia, recent left hip fracture sp ORIF on 07/17/22, who presented with edema. Patient was discharged to SNF. At skilled nursing facility he was noted to have lower extremity edema and increased oxygen requirements. On his initial physical examination his 02 saturation was mid 90's on 2 L/min per [place], blood pressure 93/70, HR 77, RR 18. Lungs with no wheezing or rales, heart with S1 and S2 present and rhythmic, abdomen with no distention, positive lower extremity edema.   Na 136, K 5,8 Cl 104 bicarbonate 20 glcucose 140, bun 128 cr 4,41 BNP 166 High sensitive troponin 9  Wbc 21.3 hgb 9,5 plt 268   Chest radiograph with bibasilar atelectasis.   EKG 67 bpm, left axis deviation, left anterior fascicular block, sinus rhythm with no significant ST segment or T wave changes.   CT abdomen and pelvis with fluid filled loops of small bowel involving the distal jejunum and proximal ileum. Consistent with partial small bowel obstruction.   Patient was placed on IV fluids.  Foley cathter was placed, obtaining 5.8 L of urine.        Assessment and Plan: * AKI (acute kidney injury) ([place]) hyperKalemia, hyperphosphatemia   Patient had significant urine output about 5 L after placing foley catheter. Currently foley bag with bloody dark urine, but continue to flow urine.   Follow up renal function with serum cr at 2.24 with K at 5.2 and serum bicarbonate at 27. Na 145, Cl 108, BUN 100.   Continue IV fluids, to prevent further hypotension will increase rate to 100 ml per hr, change to half normal saline to prevent hypernatremia and hyperchloremia. Follow up renal function in am, avoid  hypotension and nephrotoxic medications.  Sodium zirconium for hyperkalemia.   Ileus ([place]) Clinically resolving, patient with positive bowel movement.  Advanced diet with good toleration.  Continue close follow up for signs of recurrent obstruction.  Follow up with surgery recommendations.   Acute respiratory failure with hypoxia ([facility]) Multifactorial, likely related to abdominal distention and bibasilar atelectasis. Continue supplemental 02 per [place] to keep 02 saturation 92% or greater.  Incentive spirometer.   Essential hypertension Continue close blood pressure monitoring, for now continue to hold on antihypertensive medications.  Systolic blood pressure is 120 to 130 mmHg.   Dyslipidemia Hold on statin therapy until patient more stable.   Non-small cell lung cancer, Rt middle Lobe Sp resection right VATS, in 2009   Prostate cancer ([place]) Positive outlet bladder obstruction Continue foley catheter and will add flomax  Femur fracture, left ([place]) Recent right hip fracture, continue pain control and supportive medical therapy, Pt and Ot when patient more stable.         Subjective: Patient with right hip pain, poor oral intake, no nausea or vomiting, no dyspnea.   Physical Exam: Vitals:   07/29/22 0900 07/29/22 1000 07/29/22 1100 07/29/22 1500  BP: (!) 99/59 104/78 119/69 (!) 112/56  Pulse: 83 82 79 68  Resp: (!) 23 (!) 22 18 16   Temp:   98.2 F (36.8 C) 98.1 F (36.7 C)  TempSrc:   Oral Oral  SpO2: 97% 99% 96% 92%   Neurology awake and alert, mild  confusion but not agitation.  ENT with mild pallor. Very dry mucous membranes.  Cardiovascular with S1 and S2 present and tachycardic, no gallops Respiratory with no rales or wheezing Abdomen with distention but not tender No lower extremity edema Foley cathter in place with dark blood tinged urine  Data Reviewed:    Family Communication: no family at the bedside   Disposition: Status is: Inpatient Remains  inpatient appropriate because: renal failure   Planned Discharge Destination: Skilled nursing facility      Author: Tawni Millers, MD 07/29/2022 5:03 PM  For on call review [email or web address].

## 2022-07-29 NOTE — Progress Notes (Signed)
Subjective: Patient very thirsty and wanting something to eat and drink.  Has had multiple large "blowout" type BMs overnight and currently.  No N/V  ROS: See above, otherwise other systems negative  Objective: Vital signs in last 24 hours: Temp:  [96.8 F (36 C)-98.8 F (37.1 C)] 98.2 F (36.8 C) (12/13 0620) Pulse Rate:  [14-87] 78 (12/13 0600) Resp:  [17-25] 21 (12/13 0600) BP: (93-119)/(51-95) 102/80 (12/13 0600) SpO2:  [90 %-100 %] 90 % (12/13 0600)    Intake/Output from previous day: 12/12 0701 - 12/13 0700 In: -  Out: 5800 [Urine:5800] Intake/Output this shift: No intake/output data recorded.  PE: Gen: NAD Abd: soft, obese, NT, ND, +BS  Lab Results:  Recent Labs    07/28/22 1449 07/28/22 1607 07/29/22 0530  WBC 21.3*  --  15.1*  HGB 9.5* 9.9* 9.5*  HCT 29.8* 29.0* 28.0*  PLT 268  --  265   BMET Recent Labs    07/28/22 1449 07/28/22 1607 07/29/22 0530  NA 136 134* 143  K 5.8* 5.9* 5.2*  CL 104 105 108  CO2 20*  --  27  GLUCOSE 140* 137* 105*  BUN 128* >130* 100*  CREATININE 4.41* 5.00* 2.24*  CALCIUM 7.8*  --  8.1*   PT/INR No results for input(s): "LABPROT", "INR" in the last 72 hours. CMP     Component Value Date/Time   NA 143 07/29/2022 0530   K 5.2 (H) 07/29/2022 0530   CL 108 07/29/2022 0530   CO2 27 07/29/2022 0530   GLUCOSE 105 (H) 07/29/2022 0530   BUN 100 (H) 07/29/2022 0530   CREATININE 2.24 (H) 07/29/2022 0530   CREATININE 0.82 03/19/2020 0923   CALCIUM 8.1 (L) 07/29/2022 0530   PROT 5.9 (L) 07/28/2022 1449   ALBUMIN 2.3 (L) 07/28/2022 1449   AST 28 07/28/2022 1449   ALT 20 07/28/2022 1449   ALKPHOS 99 07/28/2022 1449   BILITOT 1.3 (H) 07/28/2022 1449   GFRNONAA 28 (L) 07/29/2022 0530   GFRNONAA 81 03/19/2020 0923   GFRAA 93 03/19/2020 0923   Lipase  No results found for: "LIPASE"     Studies/Results: CT CHEST ABDOMEN PELVIS WO CONTRAST  Result Date: 07/28/2022 CLINICAL DATA:  Shortness of breath and  abdominal pain, initial encounter EXAM: CT CHEST, ABDOMEN AND PELVIS WITHOUT CONTRAST TECHNIQUE: Multidetector CT imaging of the chest, abdomen and pelvis was performed following the standard protocol without IV contrast. RADIATION DOSE REDUCTION: This exam was performed according to the departmental dose-optimization program which includes automated exposure control, adjustment of the mA and/or kV according to patient size and/or use of iterative reconstruction technique. COMPARISON:  Chest x-ray from earlier in the same day. FINDINGS: CT CHEST FINDINGS Cardiovascular: Somewhat limited due to lack of IV contrast. Atherosclerotic calcifications of the thoracic aorta are noted. Decreased attenuation is noted in the cardiac blood pool suggestive of underlying anemia. Coronary calcifications are seen. Pulmonary artery as visualized is within normal limits. Mediastinum/Nodes: Thoracic inlet is unremarkable. No hilar or mediastinal adenopathy is noted. The esophagus is within normal limits. Lungs/Pleura: Bibasilar atelectasis is noted. Mild inspissated material is noted within the right lower lobe bronchial tree which may represent aspiration. Mild emphysematous changes are seen. No parenchymal nodule is noted. Musculoskeletal: Degenerative changes of the thoracic spine are seen. CT ABDOMEN PELVIS FINDINGS Hepatobiliary: Gallbladder is well distended with dependent gallstones. Liver is within normal limits. Pancreas: Unremarkable. No pancreatic ductal dilatation or surrounding inflammatory changes. Spleen: Normal in  size without focal abnormality. Adrenals/Urinary Tract: Adrenal glands are unremarkable. Kidneys demonstrate mild fullness of the collecting systems bilaterally although no significant ureteral dilatation is seen. Increased perinephric stranding is noted on the left. Significant decompression of the bladder is noted with some surrounding of the dilated loops of small bowel described below. Foley catheter is  noted within the substance of the prostate. Stomach/Bowel: Scattered diverticular change of the colon is noted without evidence of diverticulitis. Multiple dilated loops of small bowel are noted involving the distal jejunum and proximal to mid ileum. The distal ileum appears within normal limits. A transition zone is noted deep in the pelvis best seen on image number 111 of series 3. The decompressed bladder surrounds the area of transition in the low pelvis. The stomach and proximal jejunum appear within normal limits. Vascular/Lymphatic: Aortic atherosclerosis. No enlarged abdominal or pelvic lymph nodes. Reproductive: Prostate is unremarkable. Other: Minimal free fluid is noted within the pelvis. Mild changes of anasarca are seen. Musculoskeletal: Degenerative changes of the lumbar spine are noted. No acute bony abnormality is noted. Changes of recent left hip fixation are seen. IMPRESSION: Multiple dilated fluid-filled loops of small bowel involving the distal jejunum and proximal ileum. This is consistent with at least a partial small bowel obstruction. Transition zone is noted deep in the pelvis as described above likely related to adhesions. Foley catheter is noted within the substance of the prostate with significant decompression of the bladder identified. Cholelithiasis without complicating factors. Mild fullness of the collecting systems although no true hydronephrosis is seen. Mild bibasilar atelectasis with inspissated material within the lower lobe bronchial tree on the right which may represent some aspiration. Electronically Signed   By: Inez Catalina M.D.   On: 07/28/2022 19:51   US Renal  Result Date: 07/28/2022 CLINICAL DATA:  Renal failure EXAM: RENAL / URINARY TRACT ULTRASOUND COMPLETE COMPARISON:  None Available. FINDINGS: Right Kidney: Renal measurements: 12.5 x 6.0 x 4.8 cm = volume: 189 mL. Right upper pole cyst 2 cm. Right lower pole cyst 2.5 cm. Echogenicity within normal limits. No  mass or hydronephrosis visualized. Left Kidney: Renal measurements: 13.1 x 7.3 x 4.3 cm = volume: 213 mL. Left upper pole cyst 2.4 cm. Echogenicity within normal limits. No mass or hydronephrosis visualized. Bladder: Appears normal for degree of bladder distention. Other: None. IMPRESSION: Negative for hydronephrosis.  Bilateral renal cysts Electronically Signed   By: Franchot Gallo M.D.   On: 07/28/2022 18:22   DG Chest 1 View  Result Date: 07/28/2022 CLINICAL DATA:  Shortness of breath.  Bilateral leg swelling. EXAM: CHEST  1 VIEW COMPARISON:  AP chest 07/20/2022, 06/20/2022; chest two views 05/31/2022; CT chest 04/09/2010 FINDINGS: Cardiac silhouette is again mildly enlarged. There is again a tortuous thoracic aorta with mediastinal contours unchanged. There is again moderate right-sided volume loss, noting prior right lower lung partial resection on prior CT. Mild elevation of the right hemidiaphragm is unchanged. Mild right basilar linear subsegmental atelectasis and/or scarring is similar to prior. The left lung is clear. No definite pleural effusion. No pneumothorax. 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 IMPRESSION: 1. Postsurgical changes of partial right lower lung resection, chronic. 2. Mild right basilar subsegmental atelectasis and/or scarring, similar to prior. No acute lung process is seen. Electronically Signed   By: Yvonne Kendall M.D.   On: 07/28/2022 15:48    Anti-infectives: Anti-infectives (From admission, onward)    Start     Dose/Rate Route Frequency Ordered Stop   07/28/22 1800  ceFEPIme (MAXIPIME) 1 g in sodium chloride 0.9 % 100 mL IVPB  Status:  Discontinued        1 g 200 mL/hr over 30 Minutes Intravenous Every 24 hours 07/28/22 1748 07/28/22 2256        Assessment/Plan Reactive ileus secondary to multiple medical problems -resolving, having multiple BMs and clinically with no abdominal pain, N, or V. -regular diet -no surgical indications    FEN - regular diet/IVFs per primary VTE - may have prophylaxis from our standpoint ID - none needed from abdominal standpoint  ARF - improving, likely a component of urinary retention, per primary False passage with foley - foley in prostate on CT, recommend urologic evaluation for this Recent hip fx, s/p  IMN - mobilize with therapies per primary HTN New onset fluid retention with O2 requirements and 20# weight gain - per primary  I reviewed hospitalist notes, last 24 h vitals and pain scores, last 48 h intake and output, last 24 h labs and trends, and last 24 h imaging results.   LOS: 1 day    Henreitta Cea , [facility] 07/29/2022, 7:48 AM Please see Amion for pager number during day hours 7:00am-4:30pm or 7:00am -11:30am on weekends

## 2022-07-29 NOTE — Assessment & Plan Note (Addendum)
Continue with statin therapy.  ?

## 2022-07-29 NOTE — ED Notes (Signed)
Help get patient pulled up in bed back on the monitor got patient a warm blanket patient is resting with call bell in reach

## 2022-07-29 NOTE — Assessment & Plan Note (Addendum)
Positive outlet bladder obstruction Positive traumatic hematuria.  Foley catheter has been exchanged.   Continue flomax  Follow up hgb is 9,0. (Chronic anemia) Check iron panel.

## 2022-07-29 NOTE — Assessment & Plan Note (Addendum)
Recent right hip fracture,  Follow with Pt and Ot Pain control with acetaminophen.  Due to encephalopathy avoid narcotic analgesics.

## 2022-07-29 NOTE — Assessment & Plan Note (Addendum)
Volume status has improved with post obstructive diuresis.  Blood pressure systolic 196 to 222 mmHg.  Continue blood pressure monitoring, patient off antihypertensive medications.

## 2022-07-30 ENCOUNTER — Inpatient Hospital Stay (HOSPITAL_COMMUNITY): Payer: Medicare HMO

## 2022-07-30 DIAGNOSIS — G9341 Metabolic encephalopathy: Secondary | ICD-10-CM

## 2022-07-30 DIAGNOSIS — C61 Malignant neoplasm of prostate: Secondary | ICD-10-CM

## 2022-07-30 DIAGNOSIS — R0609 Other forms of dyspnea: Secondary | ICD-10-CM | POA: Diagnosis not present

## 2022-07-30 LAB — GLUCOSE, CAPILLARY: Glucose-Capillary: 131 mg/dL — ABNORMAL HIGH (ref 70–99)

## 2022-07-30 LAB — BLOOD GAS, ARTERIAL
Acid-Base Excess: 6.3 mmol/L — ABNORMAL HIGH (ref 0.0–2.0)
Bicarbonate: 32.3 mmol/L — ABNORMAL HIGH (ref 20.0–28.0)
O2 Saturation: 99 %
Patient temperature: 36.8
pCO2 arterial: 51 mmHg — ABNORMAL HIGH (ref 32–48)
pH, Arterial: 7.41 (ref 7.35–7.45)
pO2, Arterial: 93 mmHg (ref 83–108)

## 2022-07-30 LAB — CBC
HCT: 29.7 % — ABNORMAL LOW (ref 39.0–52.0)
Hemoglobin: 9.4 g/dL — ABNORMAL LOW (ref 13.0–17.0)
MCH: 32.4 pg (ref 26.0–34.0)
MCHC: 31.6 g/dL (ref 30.0–36.0)
MCV: 102.4 fL — ABNORMAL HIGH (ref 80.0–100.0)
Platelets: 385 10*3/uL (ref 150–400)
RBC: 2.9 MIL/uL — ABNORMAL LOW (ref 4.22–5.81)
RDW: 11.9 % (ref 11.5–15.5)
WBC: 18 10*3/uL — ABNORMAL HIGH (ref 4.0–10.5)
nRBC: 0 % (ref 0.0–0.2)

## 2022-07-30 LAB — BASIC METABOLIC PANEL
Anion gap: 8 (ref 5–15)
BUN: 94 mg/dL — ABNORMAL HIGH (ref 8–23)
CO2: 28 mmol/L (ref 22–32)
Calcium: 8.5 mg/dL — ABNORMAL LOW (ref 8.9–10.3)
Chloride: 113 mmol/L — ABNORMAL HIGH (ref 98–111)
Creatinine, Ser: 1.3 mg/dL — ABNORMAL HIGH (ref 0.61–1.24)
GFR, Estimated: 53 mL/min — ABNORMAL LOW (ref >60)
Glucose, Bld: 150 mg/dL — ABNORMAL HIGH (ref 70–99)
Potassium: 4.8 mmol/L (ref 3.5–5.1)
Sodium: 149 mmol/L — ABNORMAL HIGH (ref 135–145)

## 2022-07-30 LAB — ECHOCARDIOGRAM LIMITED

## 2022-07-30 MED ORDER — CHLORHEXIDINE GLUCONATE CLOTH 2 % EX PADS
6.0000 | MEDICATED_PAD | Freq: Every day | CUTANEOUS | Status: DC
  Administered 2022-07-30 – 2022-08-03 (×5): 6 via TOPICAL

## 2022-07-30 NOTE — IPAL (Signed)
I spoke with patient's daughter over the phone, we talked about John Riley current medical condition, with uremia, encephalopathy and respiratory failure. Multiple baseline medical problems and advance age, giving him a poor prognosis.  Following his advanced directives will change code status to DNR.  All questions were addressed.

## 2022-07-30 NOTE — Progress Notes (Addendum)
Progress Note   Patient: John Riley KGU:[id number] DOB: 05/26/1932 DOA: 07/28/2022     2 DOS: the patient was seen and examined on 07/30/2022   Brief hospital course: Mr. Millikan was admitted to the hospital with the working diagnosis of AKI   88 yo male with the past medical history of hypertension, dyslipidemia, recent left hip fracture sp ORIF on 07/17/22, who presented with edema. Patient was discharged to SNF. At skilled nursing facility he was noted to have lower extremity edema and increased oxygen requirements. On his initial physical examination his 02 saturation was mid 90's on 2 L/min per [place], blood pressure 93/70, HR 77, RR 18. Lungs with no wheezing or rales, heart with S1 and S2 present and rhythmic, abdomen with no distention, positive lower extremity edema.   Na 136, K 5,8 Cl 104 bicarbonate 20 glcucose 140, bun 128 cr 4,41 BNP 166 High sensitive troponin 9  Wbc 21.3 hgb 9,5 plt 268   Chest radiograph with bibasilar atelectasis.   EKG 67 bpm, left axis deviation, left anterior fascicular block, sinus rhythm with no significant ST segment or T wave changes.   CT abdomen and pelvis with fluid filled loops of small bowel involving the distal jejunum and proximal ileum. Consistent with partial small bowel obstruction.  Foley catheter is noted within the substance of the prostate with significant decompression of the bladder.   Patient was placed on IV fluids.  Foley cathter was placed, obtaining 5.8 L of urine.   12/14 positive hematuria, foley catheter passing through prostate. Catheter was exchange with good toleration.  Renal function improving Worsening mentation, with delirium.      Assessment and Plan: * AKI (acute kidney injury) ([place]) Hyperkalemia, hyperphosphatemia, hyperNatremia, hyperchloremia.   Documented urine output is 1000 ml Foley catheter has been exchanged due to initial malposition.   Renal function with serum cr at 1,30 with K at 4,8 and serum  bicarbonate at 28.  Na 149 and Cl 113.  Positive edema lower extremities.  Positive hematuria due to prostate trauma.   Plan to continue with IV fluids 0,45% saline at 50 ml per hr and follow up renal function in am. If persistent hematuria will call Urology.   Ileus ([place]) Abdominal examination has improved, patient with no nausea or vomiting.  Continue with regular diet and supportive medical therapy.   Acute metabolic encephalopathy Patient with confusion and decreased responsiveness.  He is able to open his eyes.  Poor oral intake Stat glucose 132. BUN continue to be very elevated.   Continue supportive medical care.  Pain control with non narcotic agents, avoid sedatives Fall precautions.  Continue neuro check q 4 hrs  Check ABG.   Essential hypertension Blood pressure has been stable with systolic 628 to 315 mmHg.  Continue close monitoring blood pressure, hold on antihypertensive medications.   Check echocardiogram and apply ted hose to lower extremities,. Hold on diuretic therapy for now.   Acute respiratory failure with hypoxia (HCC) Multifactorial, likely related to abdominal distention and bibasilar atelectasis.  Oxymetry is 84% on room air. 96% on 2 L/min per [place]  Continue supplemental 02 per [place] to keep 02 saturation 92% or greater.  Incentive spirometer.   Non-small cell lung cancer, Rt middle Lobe Sp resection right VATS, in 2009   Dyslipidemia Hold on statin therapy until patient more stable.   Femur fracture, left (HCC) Recent right hip fracture,  Follow with Pt and Ot Pain control with acetaminophen.  Due to encephalopathy  will hold on IV hydromorphone.   Prostate cancer ([place]) Positive outlet bladder obstruction Positive traumatic hematuria.  Foley catheter has been exchanged.   Continue flomax and close follow up on H&H.  If persistent hematuria will consult Urology.         Subjective: patient today with less responsiveness, continue  confused and altered, not able to give detailed history, most information from nursing at the bedside   Physical Exam: Vitals:   07/30/22 0620 07/30/22 0831 07/30/22 1200 07/30/22 1325  BP: 109/85 118/62  112/65  Pulse: 85 88  91  Resp:  17  20  Temp: 98.6 F (37 C) 98.3 F (36.8 C)    TempSrc:  Axillary    SpO2: (!) 84% (!) 84% 96% 98%   Neurology awake, poor responsiveness, follows simple commands, no apparent pain, repeating words.  ENT with mild pallor Cardiovascular with S1 and S2 present and tachycardic with no gallops Respiratory with scattered rales at bases with no wheezing or rhonchi Abdomen with no distention  Positive lower extremity edema ++ pitting Large ecchymosis on left flank.  Frank hematuria in foley bag.       Family Communication: I spoke with patient's daughter over the phone, we talked in detail about patient's condition, plan of care and prognosis and all questions were addressed.   Disposition: Status is: Inpatient Remains inpatient appropriate because: IV fluids   Planned Discharge Destination: Home  Author: Tawni Millers, MD 07/30/2022 2:18 PM  For on call review [email or web address].

## 2022-07-30 NOTE — Assessment & Plan Note (Addendum)
Acute delirium.  Patient with improved mentation today, no agitation and more responsive.   Continue supportive medical care.  Continue with PT and OT Continue gentle hydration with D5W to prevent worsening hypernatremia that can make encephalopathy worse.

## 2022-07-30 NOTE — Progress Notes (Signed)
Subjective: No complaints of abdominal pain.  No N/V.  Ate some yesterday with no apparent issues.  Had several BMs yesterday.   Objective: Vital signs in last 24 hours: Temp:  [97.5 F (36.4 C)-98.6 F (37 C)] 98.6 F (37 C) (12/14 0620) Pulse Rate:  [68-89] 85 (12/14 0620) Resp:  [16-23] 20 (12/14 0413) BP: (99-119)/(56-85) 109/85 (12/14 0620) SpO2:  [84 %-100 %] 84 % (12/14 0620) Last BM Date : 07/29/22  Intake/Output from previous day: 12/13 0701 - 12/14 0700 In: -  Out: 1000 [Urine:1000] Intake/Output this shift: No intake/output data recorded.  PE: Gen: laying in bed in NAD Abd: soft, obese, NT, ND  Lab Results:  Recent Labs    07/29/22 0530 07/30/22 0231  WBC 15.1* 18.0*  HGB 9.5* 9.4*  HCT 28.0* 29.7*  PLT 265 385   BMET Recent Labs    07/29/22 0530 07/30/22 0231  NA 143 149*  K 5.2* 4.8  CL 108 113*  CO2 27 28  GLUCOSE 105* 150*  BUN 100* 94*  CREATININE 2.24* 1.30*  CALCIUM 8.1* 8.5*   PT/INR No results for input(s): "LABPROT", "INR" in the last 72 hours. CMP     Component Value Date/Time   NA 149 (H) 07/30/2022 0231   K 4.8 07/30/2022 0231   CL 113 (H) 07/30/2022 0231   CO2 28 07/30/2022 0231   GLUCOSE 150 (H) 07/30/2022 0231   BUN 94 (H) 07/30/2022 0231   CREATININE 1.30 (H) 07/30/2022 0231   CREATININE 0.82 03/19/2020 0923   CALCIUM 8.5 (L) 07/30/2022 0231   PROT 5.9 (L) 07/28/2022 1449   ALBUMIN 2.3 (L) 07/28/2022 1449   AST 28 07/28/2022 1449   ALT 20 07/28/2022 1449   ALKPHOS 99 07/28/2022 1449   BILITOT 1.3 (H) 07/28/2022 1449   GFRNONAA 53 (L) 07/30/2022 0231   GFRNONAA 81 03/19/2020 0923   GFRAA 93 03/19/2020 0923   Lipase  No results found for: "LIPASE"     Studies/Results: CT CHEST ABDOMEN PELVIS WO CONTRAST  Result Date: 07/28/2022 CLINICAL DATA:  Shortness of breath and abdominal pain, initial encounter EXAM: CT CHEST, ABDOMEN AND PELVIS WITHOUT CONTRAST TECHNIQUE: Multidetector CT imaging of the  chest, abdomen and pelvis was performed following the standard protocol without IV contrast. RADIATION DOSE REDUCTION: This exam was performed according to the departmental dose-optimization program which includes automated exposure control, adjustment of the mA and/or kV according to patient size and/or use of iterative reconstruction technique. COMPARISON:  Chest x-ray from earlier in the same day. FINDINGS: CT CHEST FINDINGS Cardiovascular: Somewhat limited due to lack of IV contrast. Atherosclerotic calcifications of the thoracic aorta are noted. Decreased attenuation is noted in the cardiac blood pool suggestive of underlying anemia. Coronary calcifications are seen. Pulmonary artery as visualized is within normal limits. Mediastinum/Nodes: Thoracic inlet is unremarkable. No hilar or mediastinal adenopathy is noted. The esophagus is within normal limits. Lungs/Pleura: Bibasilar atelectasis is noted. Mild inspissated material is noted within the right lower lobe bronchial tree which may represent aspiration. Mild emphysematous changes are seen. No parenchymal nodule is noted. Musculoskeletal: Degenerative changes of the thoracic spine are seen. CT ABDOMEN PELVIS FINDINGS Hepatobiliary: Gallbladder is well distended with dependent gallstones. Liver is within normal limits. Pancreas: Unremarkable. No pancreatic ductal dilatation or surrounding inflammatory changes. Spleen: Normal in size without focal abnormality. Adrenals/Urinary Tract: Adrenal glands are unremarkable. Kidneys demonstrate mild fullness of the collecting systems bilaterally although no significant ureteral dilatation is seen. Increased  perinephric stranding is noted on the left. Significant decompression of the bladder is noted with some surrounding of the dilated loops of small bowel described below. Foley catheter is noted within the substance of the prostate. Stomach/Bowel: Scattered diverticular change of the colon is noted without evidence of  diverticulitis. Multiple dilated loops of small bowel are noted involving the distal jejunum and proximal to mid ileum. The distal ileum appears within normal limits. A transition zone is noted deep in the pelvis best seen on image number 111 of series 3. The decompressed bladder surrounds the area of transition in the low pelvis. The stomach and proximal jejunum appear within normal limits. Vascular/Lymphatic: Aortic atherosclerosis. No enlarged abdominal or pelvic lymph nodes. Reproductive: Prostate is unremarkable. Other: Minimal free fluid is noted within the pelvis. Mild changes of anasarca are seen. Musculoskeletal: Degenerative changes of the lumbar spine are noted. No acute bony abnormality is noted. Changes of recent left hip fixation are seen. IMPRESSION: Multiple dilated fluid-filled loops of small bowel involving the distal jejunum and proximal ileum. This is consistent with at least a partial small bowel obstruction. Transition zone is noted deep in the pelvis as described above likely related to adhesions. Foley catheter is noted within the substance of the prostate with significant decompression of the bladder identified. Cholelithiasis without complicating factors. Mild fullness of the collecting systems although no true hydronephrosis is seen. Mild bibasilar atelectasis with inspissated material within the lower lobe bronchial tree on the right which may represent some aspiration. Electronically Signed   By: Inez Catalina M.D.   On: 07/28/2022 19:51   US Renal  Result Date: 07/28/2022 CLINICAL DATA:  Renal failure EXAM: RENAL / URINARY TRACT ULTRASOUND COMPLETE COMPARISON:  None Available. FINDINGS: Right Kidney: Renal measurements: 12.5 x 6.0 x 4.8 cm = volume: 189 mL. Right upper pole cyst 2 cm. Right lower pole cyst 2.5 cm. Echogenicity within normal limits. No mass or hydronephrosis visualized. Left Kidney: Renal measurements: 13.1 x 7.3 x 4.3 cm = volume: 213 mL. Left upper pole cyst 2.4  cm. Echogenicity within normal limits. No mass or hydronephrosis visualized. Bladder: Appears normal for degree of bladder distention. Other: None. IMPRESSION: Negative for hydronephrosis.  Bilateral renal cysts Electronically Signed   By: Franchot Gallo M.D.   On: 07/28/2022 18:22   DG Chest 1 View  Result Date: 07/28/2022 CLINICAL DATA:  Shortness of breath.  Bilateral leg swelling. EXAM: CHEST  1 VIEW COMPARISON:  AP chest 07/20/2022, 06/20/2022; chest two views 05/31/2022; CT chest 04/09/2010 FINDINGS: Cardiac silhouette is again mildly enlarged. There is again a tortuous thoracic aorta with mediastinal contours unchanged. There is again moderate right-sided volume loss, noting prior right lower lung partial resection on prior CT. Mild elevation of the right hemidiaphragm is unchanged. Mild right basilar linear subsegmental atelectasis and/or scarring is similar to prior. The left lung is clear. No definite pleural effusion. No pneumothorax. 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 IMPRESSION: 1. Postsurgical changes of partial right lower lung resection, chronic. 2. Mild right basilar subsegmental atelectasis and/or scarring, similar to prior. No acute lung process is seen. Electronically Signed   By: Yvonne Kendall M.D.   On: 07/28/2022 15:48    Anti-infectives: Anti-infectives (From admission, onward)    Start     Dose/Rate Route Frequency Ordered Stop   07/28/22 1800  ceFEPIme (MAXIPIME) 1 g in sodium chloride 0.9 % 100 mL IVPB  Status:  Discontinued        1 g 200  mL/hr over 30 Minutes Intravenous Every 24 hours 07/28/22 1748 07/28/22 2256        Assessment/Plan Reactive ileus secondary to multiple medical problems -resolved -tolerating regular diet and having bowel function -no further surgical needs.  -we will sign off at this time, but are available as needed.   FEN - regular diet/IVFs per primary VTE - may have prophylaxis from our standpoint ID - none  needed from abdominal standpoint   ARF - improving, likely a component of urinary retention, per primary False passage with foley - foley in prostate on CT, recommend urologic evaluation for this Recent hip fx, s/p  IMN - mobilize with therapies per primary HTN New onset fluid retention with O2 requirements and 20# weight gain - per primary  I reviewed hospitalist notes, last 24 h vitals and pain scores, last 48 h intake and output, last 24 h labs and trends, and last 24 h imaging results.   LOS: 2 days    John Riley , [facility] 07/30/2022, 8:13 AM Please see Amion for pager number during day hours 7:00am-4:30pm or 7:00am -11:30am on weekends

## 2022-07-30 NOTE — Progress Notes (Signed)
Patient  has coude which is draining bloody  urine,patient alert,sleepy,on-call informed,will continue to monitor.

## 2022-07-30 NOTE — Plan of Care (Signed)

## 2022-07-30 NOTE — Progress Notes (Signed)
  Echocardiogram 2D Echocardiogram has been performed.  John Riley 07/30/2022, 4:44 PM

## 2022-07-30 NOTE — Progress Notes (Signed)
Admit: 07/28/2022 LOS: 2  16M AKI after recent admission for L hip fracture  Subjective:  Made 1L UOP yesterday Still hematuric, but not having bloody sediment SCr further improved, 1.3 today CT Abd with ? Foely bulb in the prostate? Has hx/o prostate Ca K 4.8, can stop lokelma  12/13 0701 - 12/14 0700 In: -  Out: 1000 [Urine:1000]  There were no vitals filed for this visit.  Scheduled Meds:  Chlorhexidine Gluconate Cloth  6 each Topical Daily   heparin  5,000 Units Subcutaneous Q8H   sodium chloride flush  3 mL Intravenous Q12H   sodium zirconium cyclosilicate  10 g Oral BID   Continuous Infusions:  sodium chloride 50 mL/hr at 07/30/22 1021   PRN Meds:.acetaminophen **OR** acetaminophen, HYDROmorphone (DILAUDID) injection, ondansetron **OR** ondansetron (ZOFRAN) IV  Current Labs: reviewed   Physical Exam:  Blood pressure 118/62, pulse 88, temperature 98.3 F (36.8 C), temperature source Axillary, resp. rate 17, SpO2 (!) 84 %. Elderly male, chronically ill appearing Not fully coherent, some disorientation RRR CTAB 2-3+ LEE S/nt/nd  A AKI:   clinically suggestive of pre-renal but 5.8L UOP afte foley suggests was obstruction.  Imaging did not  fully confirm this.  CT 'mild fullness of collecting system' Having grossly blood urine now which wasn't present on UA earlier this mo.  Trend to see if clears Keep foley for now but need to confirm properly located Cont IVFs Hyperkalemia, improved.  Can stop lokelma Dyspnea, CT ? Aspiration Hx/o prostate Ca, story unclear Recent L femur fracture s/p repair Azotemia, improving Leukocytosis, better today; BCx NGTD Hx/o NSCLC  P As above Medication Issues; Preferred narcotic agents for pain control are hydromorphone, fentanyl, and methadone. Morphine should not be used.  Baclofen should be avoided Avoid oral sodium phosphate and magnesium citrate based laxatives / bowel preps    Pearson Grippe MD 07/30/2022, 11:06  AM  Recent Labs  Lab 07/28/22 1449 07/28/22 1607 07/29/22 0530 07/30/22 0231  NA 136 134* 143 149*  K 5.8* 5.9* 5.2* 4.8  CL 104 105 108 113*  CO2 20*  --  27 28  GLUCOSE 140* 137* 105* 150*  BUN 128* >130* 100* 94*  CREATININE 4.41* 5.00* 2.24* 1.30*  CALCIUM 7.8*  --  8.1* 8.5*  PHOS  --   --  5.1*  --     Recent Labs  Lab 07/28/22 1449 07/28/22 1607 07/29/22 0530 07/30/22 0231  WBC 21.3*  --  15.1* 18.0*  NEUTROABS 19.6*  --   --   --   HGB 9.5* 9.9* 9.5* 9.4*  HCT 29.8* 29.0* 28.0* 29.7*  MCV 102.1*  --  97.9 102.4*  PLT 268  --  265 385

## 2022-07-30 NOTE — Evaluation (Signed)
Physical Therapy Evaluation Patient Details Name: John Riley MRN: [id number] DOB: [birth year] Today's Date: 07/30/2022  History of Present Illness  Pt is 88 yo male who presents on 07/28/22 from SNF with swelling, wt gain, and increased need for O2. Pt also with abdominal pain and found to have partial SBO.  PMH: HTN, HLD, NSCLC s/p resection, L hip ORIF 07/17/22  Clinical Impression  Pt admitted with above diagnosis. Pt received in bed and very difficult to arouse. ROM performed to surgical L hip and pt grunted at times but still not waking up. RN and then MD present to assess due to this being different than pt was this AM. Per Dr Cathlean Sauer though, this is similar to his state in the ED yesterday. Bed placed into chair position which did not rouse pt. Pt then moved into unsupported sitting on edge and he opened eyes and could state his first name. Heavy R lean in sitting with mod A to maintain. Could not progress mobility any further due to level of arousal.  Pt currently with functional limitations due to the deficits listed below (see PT Problem List). Pt will benefit from skilled PT to increase their independence and safety with mobility to allow discharge to the venue listed below.          Recommendations for follow up therapy are one component of a multi-disciplinary discharge planning process, led by the attending physician.  Recommendations may be updated based on patient status, additional functional criteria and insurance authorization.  Follow Up Recommendations Skilled nursing-short term rehab (<3 hours/day) Can patient physically be transported by private vehicle: No    Assistance Recommended at Discharge Frequent or constant Supervision/Assistance  Patient can return home with the following  A lot of help with walking and/or transfers;A lot of help with bathing/dressing/bathroom;Help with stairs or ramp for entrance;Assist for transportation    Equipment Recommendations None  recommended by PT  Recommendations for Other Services       Functional Status Assessment Patient has had a recent decline in their functional status and demonstrates the ability to make significant improvements in function in a reasonable and predictable amount of time.     Precautions / Restrictions Precautions Precautions: Fall Restrictions Weight Bearing Restrictions: Yes LLE Weight Bearing: Touchdown weight bearing Other Position/Activity Restrictions: this from d/c last admission, need new WB status orders      Mobility  Bed Mobility Overal bed mobility: Needs Assistance Bed Mobility: Supine to Sit, Sit to Supine     Supine to sit: Max assist, +2 for physical assistance Sit to supine: Max assist, +2 for physical assistance   General bed mobility comments: max A for all mobility today, pt not participating    Transfers                   General transfer comment: unable    Ambulation/Gait               General Gait Details: unable  Stairs            Wheelchair Mobility    Modified Rankin (Stroke Patients Only)       Balance Overall balance assessment: Needs assistance Sitting-balance support: No upper extremity supported, Feet supported Sitting balance-Leahy Scale: Poor Sitting balance - Comments: needed mod A to maintain sitting EOB, heavy R lean away from surgical hip Postural control: Right lateral lean  Pertinent Vitals/Pain Pain Assessment Pain Assessment: Faces Faces Pain Scale: Hurts little more Pain Location: left hip Pain Descriptors / Indicators: Grimacing, Guarding Pain Intervention(s): Limited activity within patient's tolerance, Monitored during session    Home Living Family/patient expects to be discharged to:: Skilled nursing facility Living Arrangements: Alone   Type of Home: Apartment Home Access: Stairs to enter   [employer] of Steps: on 2nd level -  full flight of stairs, no elevator   Home Layout: One level Home Equipment: Cane - single point      Prior Function Prior Level of Function : Needs assist             Mobility Comments: pt unable to state what he was doing at [facility] P[facility]       Hand Dominance   Dominant Hand: Right    Extremity/Trunk Assessment   Upper Extremity Assessment Upper Extremity Assessment: Generalized weakness    Lower Extremity Assessment Lower Extremity Assessment: Generalized weakness;Difficult to assess due to impaired cognition    Cervical / Trunk Assessment Cervical / Trunk Assessment: Kyphotic  Communication   Communication: Expressive difficulties (has a congenital stutter)  Cognition Arousal/Alertness: Lethargic Behavior During Therapy: Flat affect Overall Cognitive Status: Impaired/Different from baseline                                 General Comments: pt very lethargic and not even opening eyes, RN called to room and she stated that this was worse than this AM. She called Dr Cathlean Sauer who arrived and checked on pt. Once taken to EOB pt opened eyes and was more alert but still with very little verbalization or participation. Big change from what was described after surgery last admission        General Comments General comments (skin integrity, edema, etc.): urine bloody, RN empited 1543mL during session. BP, O2 sats, HR WNL    Exercises General Exercises - Lower Extremity Short Arc Quad: PROM, Left, 10 reps, Supine Long Arc Quad: PROM, Left, 10 reps, Seated Heel Slides: PROM, Left, 10 reps, Supine Hip ABduction/ADduction: PROM, Left, 10 reps, Supine Straight Leg Raises: PROM, Left, 10 reps, Supine   Assessment/Plan    PT Assessment Patient needs continued PT services  PT Problem List Decreased strength;Decreased activity tolerance;Decreased balance;Decreased mobility;Decreased knowledge of use of DME;Decreased safety awareness       PT Treatment Interventions  Gait training;DME instruction;Functional mobility training;Therapeutic activities;Therapeutic exercise;Balance training;Patient/family education    PT Goals (Current goals can be found in the Care Plan section)  Acute Rehab PT Goals Patient Stated Goal: none stated PT Goal Formulation: Patient unable to participate in goal setting Time For Goal Achievement: 08/13/22 Potential to Achieve Goals: Fair    Frequency Min 2X/week     Co-evaluation               AM-PAC PT "6 Clicks" Mobility  Outcome Measure Help needed turning from your back to your side while in a flat bed without using bedrails?: Total Help needed moving from lying on your back to sitting on the side of a flat bed without using bedrails?: Total Help needed moving to and from a bed to a chair (including a wheelchair)?: Total Help needed standing up from a chair using your arms (e.g., wheelchair or bedside chair)?: Total Help needed to walk in hospital room?: Total Help needed climbing 3-5 steps with a railing? : Total 6 Click Score: 6  End of Session Equipment Utilized During Treatment: Oxygen Activity Tolerance: Patient limited by lethargy Patient left: in bed;with bed alarm set Nurse Communication: Mobility status;Need for lift equipment PT Visit Diagnosis: Unsteadiness on feet (R26.81);Other abnormalities of gait and mobility (R26.89);History of falling (Z91.81);Muscle weakness (generalized) (M62.81)    Time: 1310-1340 PT Time Calculation (min) (ACUTE ONLY): 30 min   Charges:   PT Evaluation $PT Eval Moderate Complexity: 1 Mod PT Treatments $Therapeutic Activity: 8-22 mins        Leighton Roach, PT  Acute Rehab Services Secure chat preferred Office [place] 07/30/2022, 2:16 PM

## 2022-07-30 NOTE — Progress Notes (Signed)
CMT called stated short burst of SVT patient stated no chest pain or short of breath  vital signs taken and charted in [facility]

## 2022-07-31 DIAGNOSIS — N179 Acute kidney failure, unspecified: Secondary | ICD-10-CM | POA: Diagnosis not present

## 2022-07-31 DIAGNOSIS — K567 Ileus, unspecified: Secondary | ICD-10-CM | POA: Diagnosis not present

## 2022-07-31 DIAGNOSIS — G9341 Metabolic encephalopathy: Secondary | ICD-10-CM | POA: Diagnosis not present

## 2022-07-31 DIAGNOSIS — I10 Essential (primary) hypertension: Secondary | ICD-10-CM | POA: Diagnosis not present

## 2022-07-31 LAB — CBC
HCT: 28.9 % — ABNORMAL LOW (ref 39.0–52.0)
Hemoglobin: 9 g/dL — ABNORMAL LOW (ref 13.0–17.0)
MCH: 33.2 pg (ref 26.0–34.0)
MCHC: 31.1 g/dL (ref 30.0–36.0)
MCV: 106.6 fL — ABNORMAL HIGH (ref 80.0–100.0)
Platelets: 338 10*3/uL (ref 150–400)
RBC: 2.71 MIL/uL — ABNORMAL LOW (ref 4.22–5.81)
RDW: 12 % (ref 11.5–15.5)
WBC: 10.6 10*3/uL — ABNORMAL HIGH (ref 4.0–10.5)
nRBC: 0 % (ref 0.0–0.2)

## 2022-07-31 LAB — BASIC METABOLIC PANEL
Anion gap: 6 (ref 5–15)
BUN: 79 mg/dL — ABNORMAL HIGH (ref 8–23)
CO2: 29 mmol/L (ref 22–32)
Calcium: 8.3 mg/dL — ABNORMAL LOW (ref 8.9–10.3)
Chloride: 119 mmol/L — ABNORMAL HIGH (ref 98–111)
Creatinine, Ser: 0.95 mg/dL (ref 0.61–1.24)
GFR, Estimated: >60 mL/min (ref >60)
Glucose, Bld: 102 mg/dL — ABNORMAL HIGH (ref 70–99)
Potassium: 4.3 mmol/L (ref 3.5–5.1)
Sodium: 154 mmol/L — ABNORMAL HIGH (ref 135–145)

## 2022-07-31 MED ORDER — ENOXAPARIN SODIUM 40 MG/0.4ML IJ SOSY
40.0000 mg | PREFILLED_SYRINGE | INTRAMUSCULAR | Status: DC
  Administered 2022-07-31 – 2022-08-02 (×3): 40 mg via SUBCUTANEOUS
  Filled 2022-07-31 (×3): qty 0.4

## 2022-07-31 MED ORDER — DEXTROSE 5 % IV SOLN: INTRAVENOUS | Status: DC

## 2022-07-31 NOTE — Care Management Important Message (Signed)
Important Message  Patient Details  Name: KERRI ASCHE MRN: [id number] Date of Birth: 1932/03/09   Medicare Important Message Given:  Yes     Hannah Beat 07/31/2022, 2:14 PM

## 2022-07-31 NOTE — Progress Notes (Addendum)
Progress Note   Patient: John Riley TGY:[id number] DOB: 1932-03-04 DOA: 07/28/2022     3 DOS: the patient was seen and examined on 07/31/2022   Brief hospital course: John Riley was admitted to the hospital with the working diagnosis of AKI   88 yo male with the past medical history of hypertension, dyslipidemia, recent left hip fracture sp ORIF on 07/17/22, who presented with edema. Patient was discharged to SNF. At skilled nursing facility he was noted to have lower extremity edema and increased oxygen requirements. On his initial physical examination his 02 saturation was mid 90's on 2 L/min per John Riley, blood pressure 93/70, HR 77, RR 18. Lungs with no wheezing or rales, heart with S1 and S2 present and rhythmic, abdomen with no distention, positive lower extremity edema.   Na 136, K 5,8 Cl 104 bicarbonate 20 glcucose 140, bun 128 cr 4,41 BNP 166 High sensitive troponin 9  Wbc 21.3 hgb 9,5 plt 268   Chest radiograph with bibasilar atelectasis.   EKG 67 bpm, left axis deviation, left anterior fascicular block, sinus rhythm with no significant ST segment or T wave changes.   CT abdomen and pelvis with fluid filled loops of small bowel involving the distal jejunum and proximal ileum. Consistent with partial small bowel obstruction.  Foley catheter is noted within the substance of the prostate with significant decompression of the bladder.   Patient was placed on IV fluids.  Foley cathter was placed, obtaining 5.8 L of urine.   12/14 positive hematuria, foley catheter passing through prostate. Catheter was exchange with good toleration.  Renal function improving Worsening mentation, with delirium.  Code status changed to DNR.     12/15 patient with improvement in renal function and mentation.   Assessment and Plan: * AKI (acute kidney injury) (John Riley) Hyperkalemia, hyperphosphatemia, hyperNatremia, hyperchloremia.   Documented urine output is 4,200 ml (post obstruction polyuria).   Foley catheter has been exchanged due to initial malposition.   Renal function today with serum cr at 0.95, with K at 4,3 and serum bicarbonate at 29. Na is 154, Cl 119 and BUN 79.  Marland Kitchen  Positive hematuria due to prostate trauma.  Lower extremity edema has improved.   Patient with improvement in po intake, high urine output, will change fluids with D5W to continue hydration to prevent worsening hypernatremia and hyperchloremia.  Follow up renal function in am, avoid hypotension and nephrotoxic medications.   Ileus (John Riley) Clinically has resolved, patient is tolerating po well.   Acute metabolic encephalopathy Acute delirium.  Patient with improved mentation today, no agitation and more responsive.   Continue supportive medical care.  Continue with PT and OT Continue gentle hydration with D5W to prevent worsening hypernatremia that can make encephalopathy worse.  Essential hypertension Volume status has improved with post obstructive diuresis.  Blood pressure systolic 287 to 681 mmHg.  Continue blood pressure monitoring, patient off antihypertensive medications.   Acute respiratory failure with hypoxia (John Riley) Multifactorial, likely related to abdominal distention and bibasilar atelectasis.  Oxymetry is 84% on room air. 96% on 2 L/min per John Riley  Continue supplemental 02 per John Riley to keep 02 saturation 92% or greater.  Incentive spirometer.   Non-small cell lung cancer, Rt middle Lobe Sp resection right VATS, in 2009   Dyslipidemia Hold on statin therapy until patient more stable.   Femur fracture, left (John Riley) Recent right hip fracture,  Follow with Pt and Ot Pain control with acetaminophen.  Due to encephalopathy avoid narcotic analgesics.  Prostate cancer (John Riley) Positive outlet bladder obstruction Positive traumatic hematuria.  Foley catheter has been exchanged.   Continue flomax  Follow up hgb is 9,0. (Chronic anemia) Check iron panel.         Subjective: Patient  is feeling better, he is less confused and more reactive, tolerating po well, no abdominal pain, no nausea or vomiting.   Physical Exam: Vitals:   07/31/22 0450 07/31/22 0500 07/31/22 0805 07/31/22 0805  BP: 109/62  116/66   Pulse: 85  87 88  Resp:   16   Temp:   97.6 F (36.4 C)   TempSrc:   Oral   SpO2: 94%  (!) 87% 96%  Weight:  96 kg     Neurology awake and alert, follows commands and responds to simple questions ENT with mild pallor with no icterus Cardiovascular with S1 and S2 present and rhythmic with no gallops, rubs or murmurs Respiratory with scattered rhonchi with no wheezing or rales Abdomen with no distention  Trace lower extremity edema  Data Reviewed:    Family Communication: I spoke with patient's daughter at the bedside, we talked in detail about patient's condition, plan of care and prognosis and all questions were addressed.   Disposition: Status is: Inpatient Remains inpatient appropriate because: IV fluids, renal failure recovering   Planned Discharge Destination: Home    Author: Tawni Millers, MD 07/31/2022 10:01 AM  For on call review [email or web address].

## 2022-07-31 NOTE — [place] FL2 (Signed)
[place] MEDICAID FL2 LEVEL OF CARE FORM     IDENTIFICATION  Patient Name: John Riley Birthdate: Mar 27, 1932 Sex: male Admission Date (Current Location): 07/28/2022  [facility] and [place] Number:  Herbalist and Address:  [facility] [place]. [facility], [place] [address] [place], [place] [address]      Provider Number: [id number]  Attending Physician Name and Address:  Tawni Millers,*  Relative Name and Phone Number:  [name],  [phone]    Current Level of Care: Hospital Recommended Level of Care: [place] Prior Approval Number:    Date Approved/Denied:   PASRR Number: [id number]  Discharge Plan: SNF    Current Diagnoses: Patient Active Problem List   Diagnosis Date Noted   Acute metabolic encephalopathy 31/49/7026   AKI (acute kidney injury) ([place]) 07/28/2022   Acute respiratory failure with hypoxia ([place]) 07/28/2022   Leukocytosis 07/28/2022   Ileus ([place]) 07/28/2022   Hyperkalemia 37/85/8850   Metabolic acidosis 27/74/1287   Closed fracture of left hip ([place]) 07/18/2022   Femur fracture, left ([place]) 07/16/2022   Cervical pain 07/16/2022   Impaired glucose tolerance 02/09/2014   Dyspnea 12/13/2012   Prostate cancer ([place]) 08/05/2012   Non-small cell lung cancer, Rt middle Lobe    INSOMNIA 12/07/2007   Dyslipidemia 06/28/2007   Essential hypertension 06/28/2007    Orientation RESPIRATION BLADDER Height & Weight     Self, Situation  Normal Continent, Indwelling catheter (Coude catheter) Weight: 211 lb 10.3 oz (96 kg) Height:     BEHAVIORAL SYMPTOMS/MOOD NEUROLOGICAL BOWEL NUTRITION STATUS        Diet (See DC summary)  AMBULATORY STATUS COMMUNICATION OF NEEDS Skin   Total Care Verbally Surgical wounds (L hip incision)                       Personal Care Assistance Level of Assistance  Bathing, Feeding, Dressing Bathing Assistance: Maximum assistance Feeding assistance: Maximum assistance Dressing  Assistance: Maximum assistance     Functional Limitations Info  Sight, Hearing, Speech Sight Info: Adequate Hearing Info: Adequate Speech Info: Adequate    SPECIAL CARE FACTORS FREQUENCY  PT (By licensed PT), OT (By licensed OT)     PT Frequency: 5x week OT Frequency: 5x week            Contractures Contractures Info: Not present    Additional Factors Info  Code Status, Allergies Code Status Info: DNR Allergies Info: Niacin           Current Medications (07/31/2022):  This is the current hospital active medication list Current Facility-Administered Medications  Medication Dose Route Frequency Provider Last Rate Last Admin   acetaminophen (TYLENOL) tablet 650 mg  650 mg Oral Q6H PRN Opyd, Ilene Qua, MD   650 mg at 07/31/22 1129   Or   acetaminophen (TYLENOL) suppository 650 mg  650 mg Rectal Q6H PRN Opyd, Ilene Qua, MD       Chlorhexidine Gluconate Cloth 2 % PADS 6 each  6 each Topical Daily Arrien, Jimmy Picket, MD   6 each at 07/31/22 0952   dextrose 5 % solution   Intravenous Continuous Pearson Grippe B, MD 75 mL/hr at 07/31/22 1245 Rate Change at 07/31/22 1245   enoxaparin (LOVENOX) injection 40 mg  40 mg Subcutaneous Q24H Arrien, Jimmy Picket, MD       ondansetron [facility]) tablet 4 mg  4 mg Oral Q6H PRN Opyd, Ilene Qua, MD  Or   ondansetron (ZOFRAN) injection 4 mg  4 mg Intravenous Q6H PRN Opyd, Ilene Qua, MD         Discharge Medications: Please see discharge summary for a list of discharge medications.  Relevant Imaging Results:  Relevant Lab Results:   Additional Information SSN [id number]  [name], [place]

## 2022-07-31 NOTE — Progress Notes (Signed)
Admit: 07/28/2022 LOS: 3  62M AKI after recent admission for L hip fracture  Subjective:  Family at bedside Foley catheter exchanged yesterday More blood in the urine today SCr normalized SNa 154 today, started on D5 gtt 4.2L UOP yesterday Has hx/o prostate Ca -- active surveillance was the plan  12/14 0701 - 12/15 0700 In: 2662.4 [I.V.:2662.4] Out: 4200 [Urine:4200]  Filed Weights   07/31/22 0500  Weight: 96 kg    Scheduled Meds:  Chlorhexidine Gluconate Cloth  6 each Topical Daily   enoxaparin (LOVENOX) injection  40 mg Subcutaneous Q24H   Continuous Infusions:  dextrose 50 mL/hr at 07/31/22 1133   PRN Meds:.acetaminophen **OR** acetaminophen, ondansetron **OR** ondansetron (ZOFRAN) IV  Current Labs: reviewed   Physical Exam:  Blood pressure 116/66, pulse 88, temperature 97.6 F (36.4 C), temperature source Oral, resp. rate 16, weight 96 kg, SpO2 96 %. Elderly male, chronically ill appearing Not fully coherent, some disorientation RRR CTAB 2-3+ LEE S/nt/nd  A AKI:   clinically suggestive of pre-renal but 5.8L UOP afte foley suggests was obstruction.  Imaging did not  fully confirm this.  CT 'mild fullness of collecting system' Having grossly blood urine now which wasn't present on UA earlier this mo.  Trend to see if clears -- still seems like foley trauma most likely but might req uro eval Will req uro eval for ? BPH or obstructing malignancy in time Hypernatremia, likely solute diuresis, free water started, titrae as needed Hyperkalemia, improved.  Can stop lokelma Dyspnea, CT ? Aspiration Hx/o prostate Ca, active surveillance to date with urology Recent L femur fracture s/p repair Azotemia, improving Leukocytosis, better today; BCx NGTD Hx/o NSCLC  P As above No further issues, SNa will correct with free water and resolution of AKI Will need close urology f/u for obstruction and ? BPH or prostate Ca Medication Issues; Preferred narcotic agents for  pain control are hydromorphone, fentanyl, and methadone. Morphine should not be used.  Baclofen should be avoided Avoid oral sodium phosphate and magnesium citrate based laxatives / bowel preps    Pearson Grippe MD 07/31/2022, 12:41 PM  Recent Labs  Lab 07/29/22 0530 07/30/22 0231 07/31/22 0308  NA 143 149* 154*  K 5.2* 4.8 4.3  CL 108 113* 119*  CO2 27 28 29   GLUCOSE 105* 150* 102*  BUN 100* 94* 79*  CREATININE 2.24* 1.30* 0.95  CALCIUM 8.1* 8.5* 8.3*  PHOS 5.1*  --   --     Recent Labs  Lab 07/28/22 1449 07/28/22 1607 07/29/22 0530 07/30/22 0231 07/31/22 0308  WBC 21.3*  --  15.1* 18.0* 10.6*  NEUTROABS 19.6*  --   --   --   --   HGB 9.5*   < > 9.5* 9.4* 9.0*  HCT 29.8*   < > 28.0* 29.7* 28.9*  MCV 102.1*  --  97.9 102.4* 106.6*  PLT 268  --  265 385 338   < > = values in this interval not displayed.

## 2022-07-31 NOTE — TOC Initial Note (Signed)
Transition of Care [facility]) - Initial/Assessment Note    Patient Details  Name: John Riley MRN: [id number] Date of Birth: 17-Feb-1932  Transition of Care [facility]) CM/SW Contact:    [name], [place] Phone Number: 07/31/2022, 1:48 PM  Clinical Narrative:                  CSW met with pt and family at bedside. Family confirmed pt was at [place] and they were agreeable to returning at DC. CSW discussed insurance, Medicaid, and LTC plans. Family noting pt lived alone in a second story apartment, but they have sold that. Considering ALF if appropriate. Pt currently disoriented, but family notes that is recent due to medication changes. CSW sent information to [facility] and updated facility, pt is NMR, authorization will need to be started closer to DC. TOC will continue to follow for DC needs.  Expected Discharge Plan: Skilled Nursing Facility Barriers to Discharge: Continued Medical Work up, Ship broker, SNF Pending bed offer   Patient Goals and CMS Choice Patient states their goals for this hospitalization and ongoing recovery are:: Pt disoriented and unable to participate in goal setting at this time. CMS Medicare.gov Compare Post Acute Care list provided to:: Patient Represenative (must comment) Choice offered to / list presented to : Adult Children  Expected Discharge Plan and Services Expected Discharge Plan: [place] Acute Care Choice: [place] arrangements for the past 2 months: Apartment                                      Prior Living Arrangements/Services Living arrangements for the past 2 months: Apartment Lives with:: Self Patient language and need for interpreter reviewed:: Yes Do you feel safe going back to the place where you live?: Yes      Need for Family Participation in Patient Care: Yes (Comment) Care giver support system in place?: Yes (comment)   Criminal Activity/Legal Involvement Pertinent  to Current Situation/Hospitalization: No - Comment as needed  Activities of Daily Living      Permission Sought/Granted Permission sought to share information with : Family Supports Permission granted to share information with : Yes, Verbal Permission Granted  Share Information with NAME: John Riley,  [phone]     Permission granted to share info w Relationship: daughter  Permission granted to share info w Contact Information: [phone]  Emotional Assessment Appearance:: Appears stated age Attitude/Demeanor/Rapport: Inconsistent Affect (typically observed): Agitated Orientation: : Oriented to Self, Oriented to Situation Alcohol / Substance Use: Not Applicable Psych Involvement: No (comment)  Admission diagnosis:  Hyperkalemia [E87.5] AKI (acute kidney injury) ([place]) [N17.9] Patient Active Problem List   Diagnosis Date Noted   Acute metabolic encephalopathy 62/83/6629   AKI (acute kidney injury) ([place]) 07/28/2022   Acute respiratory failure with hypoxia ([place]) 07/28/2022   Leukocytosis 07/28/2022   Ileus ([place]) 07/28/2022   Hyperkalemia 47/65/4650   Metabolic acidosis 35/46/5681   Closed fracture of left hip ([place]) 07/18/2022   Femur fracture, left ([place]) 07/16/2022   Cervical pain 07/16/2022   Impaired glucose tolerance 02/09/2014   Dyspnea 12/13/2012   Prostate cancer ([place]) 08/05/2012   Non-small cell lung cancer, Rt middle Lobe    INSOMNIA 12/07/2007   Dyslipidemia 06/28/2007   Essential hypertension 06/28/2007   PCP:  Dorothyann Peng, NP Pharmacy:   [place], [address] [place] [address] Phone: [phone] Fax: [phone]     Social Determinants of Health (SDOH) Interventions    Readmission Risk Interventions     No data to display

## 2022-07-31 NOTE — Progress Notes (Signed)
       CROSS COVER NOTE  NAME: John Riley MRN: [id number] DOB : [birth year]    Date of Service   07/31/2022   HPI/Events of Note   Notified by bedside RN of patient with hematuria.  Per RN patient has no complaints of pain or or other discomfort.  VSS.   Based on previous notes, patient developed hematuria on 12/14 due to prostate trauma from previous Foley catheter insertion.  RN has not been notified of this during shift report.  No new orders at this time.     Interventions/ Plan   RN to continue monitoring for acute changes CBC will be collected in the morning.        Raenette Rover, DNP, [place]

## 2022-08-01 DIAGNOSIS — K567 Ileus, unspecified: Secondary | ICD-10-CM | POA: Diagnosis not present

## 2022-08-01 DIAGNOSIS — G9341 Metabolic encephalopathy: Secondary | ICD-10-CM | POA: Diagnosis not present

## 2022-08-01 DIAGNOSIS — I10 Essential (primary) hypertension: Secondary | ICD-10-CM | POA: Diagnosis not present

## 2022-08-01 DIAGNOSIS — N179 Acute kidney failure, unspecified: Secondary | ICD-10-CM | POA: Diagnosis not present

## 2022-08-01 LAB — BASIC METABOLIC PANEL
Anion gap: 9 (ref 5–15)
BUN: 44 mg/dL — ABNORMAL HIGH (ref 8–23)
CO2: 27 mmol/L (ref 22–32)
Calcium: 7.7 mg/dL — ABNORMAL LOW (ref 8.9–10.3)
Chloride: 113 mmol/L — ABNORMAL HIGH (ref 98–111)
Creatinine, Ser: 0.77 mg/dL (ref 0.61–1.24)
GFR, Estimated: >60 mL/min (ref >60)
Glucose, Bld: 123 mg/dL — ABNORMAL HIGH (ref 70–99)
Potassium: 4 mmol/L (ref 3.5–5.1)
Sodium: 149 mmol/L — ABNORMAL HIGH (ref 135–145)

## 2022-08-01 LAB — HEMOGLOBIN AND HEMATOCRIT, BLOOD
HCT: 25 % — ABNORMAL LOW (ref 39.0–52.0)
Hemoglobin: 8 g/dL — ABNORMAL LOW (ref 13.0–17.0)

## 2022-08-01 MED ORDER — SIMVASTATIN 20 MG PO TABS
40.0000 mg | ORAL_TABLET | Freq: Every day | ORAL | Status: DC
  Administered 2022-08-01: 40 mg via ORAL
  Filled 2022-08-01: qty 2

## 2022-08-01 MED ORDER — TAB-A-VITE/IRON PO TABS
1.0000 | ORAL_TABLET | Freq: Every day | ORAL | Status: DC
  Filled 2022-08-01 (×2): qty 1

## 2022-08-01 MED ORDER — ENSURE ENLIVE PO LIQD
237.0000 mL | Freq: Two times a day (BID) | ORAL | Status: DC
  Administered 2022-08-02 – 2022-08-03 (×4): 237 mL via ORAL

## 2022-08-01 MED ORDER — PROSOURCE PLUS PO LIQD
30.0000 mL | Freq: Two times a day (BID) | ORAL | Status: DC
  Administered 2022-08-02 – 2022-08-03 (×4): 30 mL via ORAL
  Filled 2022-08-01 (×3): qty 30

## 2022-08-01 MED ORDER — ADULT MULTIVITAMIN W/MINERALS CH
1.0000 | ORAL_TABLET | Freq: Every day | ORAL | Status: DC
  Administered 2022-08-01 – 2022-08-03 (×3): 1 via ORAL
  Filled 2022-08-01 (×3): qty 1

## 2022-08-01 NOTE — Evaluation (Signed)
Occupational Therapy Evaluation Patient Details Name: John Riley MRN: [id number] DOB: [birth year] Today's Date: 08/01/2022   History of Present Illness Pt is 88 yo male who presents on 07/28/22 from SNF with swelling, wt gain, and increased need for O2. Pt also with abdominal pain and found to have partial SBO.  PMH: HTN, HLD, NSCLC s/p resection, L hip ORIF 07/17/22   Clinical Impression   Patient admitted for the diagnosis above.  PTA he was at a local SNF undergoing short term rehab due to an ORIF 2 weeks prior.  Unfortunately he is unable to really describe any therapies he was undergoing, and continues with decreased mentation, but his level of alertness has improved.  Deficits impacting independence are listed below.  Acute OT is indicated to maximize his functional status, and assist with his transition back to SNF for continued rehab, prior to returning home.  The patient does not have any assist at home, and will be unable to directly transition to home.        Recommendations for follow up therapy are one component of a multi-disciplinary discharge planning process, led by the attending physician.  Recommendations may be updated based on patient status, additional functional criteria and insurance authorization.   Follow Up Recommendations  Skilled nursing-short term rehab (<3 hours/day)     Assistance Recommended at Discharge Frequent or constant Supervision/Assistance  Patient can return home with the following Two people to help with walking and/or transfers;A lot of help with bathing/dressing/bathroom;Assist for transportation    Functional Status Assessment  Patient has had a recent decline in their functional status and demonstrates the ability to make significant improvements in function in a reasonable and predictable amount of time.  Equipment Recommendations  Wheelchair (measurements OT);Wheelchair cushion (measurements OT)    Recommendations for Other Services        Precautions / Restrictions Precautions Precautions: Fall Restrictions Weight Bearing Restrictions: Yes LLE Weight Bearing: Partial weight bearing LLE Partial Weight Bearing Percentage or Pounds: transfers only Other Position/Activity Restrictions: PT left MD message regarding and changes to WBS since last d/c      Mobility Bed Mobility Overal bed mobility: Needs Assistance Bed Mobility: Supine to Sit, Sit to Supine     Supine to sit: Max assist, Mod assist, HOB elevated Sit to supine: Max assist   General bed mobility comments: able to use his arms to prop and reach for SR's, but unable to move LE's to assist.  Patient flopped back with lying down due to discomfort. Patient Response: Cooperative  Transfers                   General transfer comment: unable      Balance Overall balance assessment: Needs assistance Sitting-balance support: Feet supported Sitting balance-Leahy Scale: Poor                                     ADL either performed or assessed with clinical judgement   ADL Overall ADL's : Needs assistance/impaired Eating/Feeding: Set up;Bed level   Grooming: Wash/dry hands;Wash/dry face;Set up;Bed level   Upper Body Bathing: Minimal assistance;Bed level   Lower Body Bathing: Maximal assistance;Bed level   Upper Body Dressing : Minimal assistance;Sitting   Lower Body Dressing: Maximal assistance;Bed level       Toileting- Clothing Manipulation and Hygiene: Total assistance;Bed level  Vision   Vision Assessment?: No apparent visual deficits     Perception     Praxis      Pertinent Vitals/Pain Pain Assessment Pain Assessment: Faces Faces Pain Scale: Hurts little more Pain Location: left hip Pain Descriptors / Indicators: Grimacing, Guarding Pain Intervention(s): Monitored during session     Hand Dominance Right   Extremity/Trunk Assessment Upper Extremity Assessment Upper Extremity  Assessment: Generalized weakness   Lower Extremity Assessment Lower Extremity Assessment: Defer to PT evaluation   Cervical / Trunk Assessment Cervical / Trunk Assessment: Kyphotic   Communication Communication Communication: Expressive difficulties   Cognition Arousal/Alertness: Awake/alert Behavior During Therapy: Anxious Overall Cognitive Status: Impaired/Different from baseline Area of Impairment: Orientation, Attention, Memory, Following commands, Awareness, Problem solving                 Orientation Level: Person, Situation Current Attention Level: Sustained Memory: Decreased short-term memory Following Commands: Follows one step commands with increased time   Awareness: Emergent Problem Solving: Slow processing, Difficulty sequencing, Requires verbal cues       General Comments   VSS on O2    Exercises     Shoulder Instructions      Home Living Family/patient expects to be discharged to:: Skilled nursing facility Living Arrangements: Alone   Type of Home: Apartment Home Access: Stairs to enter Entrance Stairs-Number of Steps: on 2nd level - full flight of stairs, no elevator   Home Layout: One level               Home Equipment: Cane - single point          Prior Functioning/Environment               Mobility Comments: pt unable to state what he was doing at [facility] PTA ADLs Comments: pt unable to state what he was doing at [facility] PTA        OT Problem List: Decreased strength;Decreased range of motion;Decreased activity tolerance;Impaired balance (sitting and/or standing);Decreased knowledge of precautions;Decreased safety awareness;Decreased cognition;Pain;Increased edema      OT Treatment/Interventions: Self-care/ADL training;Therapeutic exercise;Therapeutic activities;Cognitive remediation/compensation;DME and/or AE instruction;Patient/family education;Balance training    OT Goals(Current goals can be found in the care plan section)  Acute Rehab OT Goals OT Goal Formulation: Patient unable to participate in goal setting Time For Goal Achievement: 08/14/22 Potential to Achieve Goals: Good ADL Goals Pt Will Perform Grooming: with set-up;sitting Pt Will Perform Upper Body Dressing: with set-up;sitting Pt Will Perform Lower Body Dressing: with mod assist;sitting/lateral leans Pt Will Transfer to Toilet: with mod assist;stand pivot transfer;bedside commode Pt/caregiver will Perform Home Exercise Program: Increased strength;Both right and left upper extremity;With theraband;With minimal assist  OT Frequency: Min 2X/week    Co-evaluation              AM-PAC OT "6 Clicks" Daily Activity     Outcome Measure Help from another person eating meals?: None Help from another person taking care of personal grooming?: None Help from another person toileting, which includes using toliet, bedpan, or urinal?: A Lot Help from another person bathing (including washing, rinsing, drying)?: A Lot Help from another person to put on and taking off regular upper body clothing?: A Lot Help from another person to put on and taking off regular lower body clothing?: A Lot 6 Click Score: 16   End of Session Equipment Utilized During Treatment: Oxygen Nurse Communication: Mobility status  Activity Tolerance: Patient limited by pain Patient left: in bed;with call bell/phone within reach  OT Visit Diagnosis: Unsteadiness on feet (R26.81);Muscle weakness (generalized) (M62.81);History of falling (Z91.81);Pain;Other symptoms and signs involving cognitive function Pain - Right/Left: Left Pain - part of body: Leg;Hip                Time: 4081-4481 OT Time Calculation (min): 18 min Charges:  OT General Charges $OT Visit: 1 Visit OT Evaluation $OT Eval Moderate Complexity: 1 Mod  08/01/2022  RP, OTR/L  Acute Rehabilitation Services  Office:  [phone]   Metta Clines 08/01/2022, 4:46 PM

## 2022-08-01 NOTE — Progress Notes (Signed)
Initial Nutrition Assessment  DOCUMENTATION CODES:   Not applicable  INTERVENTION:   -Ensure Enlive po BID, each supplement provides 350 kcal and 20 grams of protein -30 ml Prosource Plus BID, each supplement provides 100 kcals and 15 grams protein -MVI with minerals daily -Continue regular diet  NUTRITION DIAGNOSIS:   Inadequate oral intake related to lethargy/confusion, poor appetite as evidenced by meal completion < 25%.  GOAL:   Patient will meet greater than or equal to 90% of their needs  MONITOR:   PO intake, Supplement acceptance  REASON FOR ASSESSMENT:   Consult Assessment of nutrition requirement/status  ASSESSMENT:   Pt with medical history significant for hypertension, hyperlipidemia, non-small cell lung cancer s/p resection, and left hip fracture status post ORIF presents for evaluation of weight gain, leg swelling, and supplemental oxygen requirement.  Pt admitted with AKI.  Reviewed I/O's: -9.5 L since   Of note, pt with recent lt hip fracture s/p ORIF. Pt had ileus on admission, but has since resolved per general surgery (on 07/30/22). Pt with acute delirium earlier this hospitalization, but this has improved per MD.   Pt with very poor oral intake. Noted meal completions 0-10%.    Reviewed wt hx; no wt loss noted over the past 7 months.   Per MD notes, pt with poor prognosis.   Medications reviewed and include dextrose 5% solution @ 75 ml/hr.   Lab Results  Component Value Date   HGBA1C 5.8 07/14/2022   PTA DM medications are none.   Labs reviewed: Na: 149, CBGS: 131 (inpatient orders for glycemic control are ).    Diet Order:   Diet Order             Diet regular Room service appropriate? Yes; Fluid consistency: Thin  Diet effective now                   EDUCATION NEEDS:   No education needs have been identified at this time  Skin:  Skin Assessment: Skin Integrity Issues: Skin Integrity Issues:: Incisions Incisions: closed lt  hip  Last BM:  08/01/22  Height:   Ht Readings from Last 1 Encounters:  07/18/22 5' 10.75" (1.797 m)    Weight:   Wt Readings from Last 1 Encounters:  08/01/22 95.8 kg    Ideal Body Weight:  78.2 kg  BMI:  Body mass index is 29.67 kg/m.  Estimated Nutritional Needs:   Kcal:  3790-2409  Protein:  90-105 grams  Fluid:  > 1.7 L    Loistine Chance, RD, LDN, [place] Registered Dietitian II Certified Diabetes Care and Education Specialist Please refer to [facility] for RD and/or RD on-call/weekend/after hours pager

## 2022-08-01 NOTE — Progress Notes (Signed)
Progress Note   Patient: John Riley FGH:[id number] DOB: Apr 28, 1932 DOA: 07/28/2022     4 DOS: the patient was seen and examined on 08/01/2022   Brief hospital course: Mr. Auriemma was admitted to the hospital with the working diagnosis of AKI   88 yo male with the past medical history of hypertension, dyslipidemia, recent left hip fracture sp ORIF on 07/17/22, who presented with edema. Patient was discharged to SNF. At skilled nursing facility he was noted to have lower extremity edema and increased oxygen requirements. On his initial physical examination his 02 saturation was mid 90's on 2 L/min per [place], blood pressure 93/70, HR 77, RR 18. Lungs with no wheezing or rales, heart with S1 and S2 present and rhythmic, abdomen with no distention, positive lower extremity edema.   Na 136, K 5,8 Cl 104 bicarbonate 20 glcucose 140, bun 128 cr 4,41 BNP 166 High sensitive troponin 9  Wbc 21.3 hgb 9,5 plt 268   Chest radiograph with bibasilar atelectasis.   EKG 67 bpm, left axis deviation, left anterior fascicular block, sinus rhythm with no significant ST segment or T wave changes.   CT abdomen and pelvis with fluid filled loops of small bowel involving the distal jejunum and proximal ileum. Consistent with partial small bowel obstruction.  Foley catheter is noted within the substance of the prostate with significant decompression of the bladder.   Patient was placed on IV fluids.  Foley cathter was placed, obtaining 5.8 L of urine.   12/14 positive hematuria, foley catheter passing through prostate. Catheter was exchange with good toleration.  Renal function improving Worsening mentation, with delirium.  Code status changed to DNR.     12/15 patient with improvement in renal function and mentation.  12/16 mentation and renal function are improving, patient will need SNF at discharge.   Assessment and Plan: * AKI (acute kidney injury) ([place]) Hyperkalemia, hyperphosphatemia,  hyperNatremia, hyperchloremia.   Documented urine output is 3,200 ml (post obstruction polyuria).  Foley catheter has been exchanged due to initial malposition.  Urine is more clear today but continue blood tinged.  Peripheral edema has improved, ted hose in place.   Today is renal function has a serum cr of 0.77, K is 4,0 and serum bicarbonate 27,  Na 149 and Cl 113.   Continue free water repletion, considering post obstructive polyuria.  On D5W at 75 ml per hr.  Follow up renal function and electrolytes in am.   Ileus ([place]) Clinically has resolved, patient is tolerating po well.   Acute metabolic encephalopathy Acute delirium resolved.  Seems to be back to baseline today.   Continue correction of electrolytes Pt and Ot Patient will need SNF.  Add multivitamins and consult nutrition.   Essential hypertension Echocardiogram with poor windows, but seems to have preserved LV systolic function per my interpretation.  Volume status has improved, post obstructive diuresis.   Systolic blood pressure 678 to 120 mmHg Continue to hold on antihypertensive medications (at home on atenolol and lisinopril (40 mg).   Acute respiratory failure with hypoxia ([facility]) Multifactorial, likely related to abdominal distention and bibasilar atelectasis.  96% to 97% on 2 L/min per [place]  Continue supplemental 02 per  to keep 02 saturation 92% or greater.  Incentive spirometer.   Non-small cell lung cancer, Rt middle Lobe Sp resection right VATS, in 2009   Dyslipidemia Hold on statin therapy until patient more stable.   Femur fracture, left ([place]) Recent right hip fracture,  Follow with Pt  and Ot Pain control with acetaminophen.  Avoid narcotic analgesics.    Prostate cancer ([place]) Positive outlet bladder obstruction Positive traumatic hematuria with acute blood loss anemia.  Foley catheter has been exchanged.  Urine seems to be more clear today, no evidence of clots.  Follow up hgb down  to 8.0  Plan to follow up hgb in am.  Reactive leukocytosis, with no signs of bacterial infection, continue to hold on antibiotic therapy. Follow up cell count in am.  Continue to hold on aspirin.         Subjective: Patient is more awake and alert, he is orientated and following commands, able to respond to questions.   Physical Exam: Vitals:   07/31/22 1735 07/31/22 2020 08/01/22 0500 08/01/22 0758  BP: 104/60 (!) 95/51 112/61 121/64  Pulse: 80 82 81 81  Resp: 17 17 17 16   Temp: 97.8 F (36.6 C) 98.2 F (36.8 C) 98 F (36.7 C) 98.8 F (37.1 C)  TempSrc: Oral Oral Oral Oral  SpO2: 94% 97% 96% 97%  Weight:   95.8 kg    Neurology awake and alert, able to answer questions and follow commands, orientated  ENT with mild pallor Cardiovascular with S1 and S2 present and rhythmic with no gallops, rubs or murmurs Respiratory with scattered rhonchi but not rales or wheezing Abdomen with no distention or tenderness Lower extremity with trace edema (ted hose in place). Foley bag with blood tinged urine.  Data Reviewed:    Family Communication: no family at the bedside   Disposition: Status is: Inpatient Remains inpatient appropriate because: hematuria and improvement of electrolytes   Planned Discharge Destination: Skilled nursing facility    Author: Tawni Millers, MD 08/01/2022 9:47 AM  For on call review [email or web address].

## 2022-08-02 DIAGNOSIS — K567 Ileus, unspecified: Secondary | ICD-10-CM | POA: Diagnosis not present

## 2022-08-02 DIAGNOSIS — I10 Essential (primary) hypertension: Secondary | ICD-10-CM | POA: Diagnosis not present

## 2022-08-02 DIAGNOSIS — N179 Acute kidney failure, unspecified: Secondary | ICD-10-CM | POA: Diagnosis not present

## 2022-08-02 DIAGNOSIS — G9341 Metabolic encephalopathy: Secondary | ICD-10-CM | POA: Diagnosis not present

## 2022-08-02 LAB — CULTURE, BLOOD (ROUTINE X 2)
Culture: NO GROWTH
Culture: NO GROWTH

## 2022-08-02 LAB — CBC WITH DIFFERENTIAL/PLATELET
Abs Immature Granulocytes: 0.2 10*3/uL — ABNORMAL HIGH (ref 0.00–0.07)
Basophils Absolute: 0 10*3/uL (ref 0.0–0.1)
Basophils Relative: 0 %
Eosinophils Absolute: 0.3 10*3/uL (ref 0.0–0.5)
Eosinophils Relative: 4 %
HCT: 26 % — ABNORMAL LOW (ref 39.0–52.0)
Hemoglobin: 8.1 g/dL — ABNORMAL LOW (ref 13.0–17.0)
Immature Granulocytes: 2 %
Lymphocytes Relative: 7 %
Lymphs Abs: 0.7 10*3/uL (ref 0.7–4.0)
MCH: 32.7 pg (ref 26.0–34.0)
MCHC: 31.2 g/dL (ref 30.0–36.0)
MCV: 104.8 fL — ABNORMAL HIGH (ref 80.0–100.0)
Monocytes Absolute: 0.7 10*3/uL (ref 0.1–1.0)
Monocytes Relative: 7 %
Neutro Abs: 7.6 10*3/uL (ref 1.7–7.7)
Neutrophils Relative %: 80 %
Platelets: 290 10*3/uL (ref 150–400)
RBC: 2.48 MIL/uL — ABNORMAL LOW (ref 4.22–5.81)
RDW: 11.7 % (ref 11.5–15.5)
WBC: 9.5 10*3/uL (ref 4.0–10.5)
nRBC: 0 % (ref 0.0–0.2)

## 2022-08-02 LAB — BASIC METABOLIC PANEL
Anion gap: 4 — ABNORMAL LOW (ref 5–15)
BUN: 24 mg/dL — ABNORMAL HIGH (ref 8–23)
CO2: 29 mmol/L (ref 22–32)
Calcium: 7.6 mg/dL — ABNORMAL LOW (ref 8.9–10.3)
Chloride: 112 mmol/L — ABNORMAL HIGH (ref 98–111)
Creatinine, Ser: 0.74 mg/dL (ref 0.61–1.24)
GFR, Estimated: >60 mL/min (ref >60)
Glucose, Bld: 124 mg/dL — ABNORMAL HIGH (ref 70–99)
Potassium: 3.8 mmol/L (ref 3.5–5.1)
Sodium: 145 mmol/L (ref 135–145)

## 2022-08-02 MED ORDER — AMLODIPINE BESYLATE 5 MG PO TABS
5.0000 mg | ORAL_TABLET | Freq: Every day | ORAL | Status: DC
  Administered 2022-08-02 – 2022-08-03 (×2): 5 mg via ORAL
  Filled 2022-08-02 (×2): qty 1

## 2022-08-02 MED ORDER — THIAMINE MONONITRATE 100 MG PO TABS
200.0000 mg | ORAL_TABLET | Freq: Every day | ORAL | Status: DC
  Administered 2022-08-02 – 2022-08-03 (×2): 200 mg via ORAL
  Filled 2022-08-02 (×2): qty 2

## 2022-08-02 MED ORDER — SIMVASTATIN 20 MG PO TABS
20.0000 mg | ORAL_TABLET | Freq: Every day | ORAL | Status: DC
  Administered 2022-08-02: 20 mg via ORAL
  Filled 2022-08-02: qty 1

## 2022-08-02 NOTE — Progress Notes (Addendum)
Progress Note   Patient: John Riley UKG:[id number] DOB: May 11, 1932 DOA: 07/28/2022     5 DOS: the patient was seen and examined on 08/02/2022   Brief hospital course: Mr. [name] was admitted to the hospital with the working diagnosis of AKI due to urinary retention in the setting of ileus.   88 yo male with the past medical history of hypertension, dyslipidemia, recent left hip fracture sp ORIF on 07/17/22, who presented with edema. Patient was discharged to SNF. At skilled nursing facility he was noted to have lower extremity edema and increased oxygen requirements. On his initial physical examination his 02 saturation was mid 90's on 2 L/min per [place], blood pressure 93/70, HR 77, RR 18. Lungs with no wheezing or rales, heart with S1 and S2 present and rhythmic, abdomen with no distention, positive lower extremity edema.   Na 136, K 5,8 Cl 104 bicarbonate 20 glcucose 140, bun 128 cr 4,41 BNP 166 High sensitive troponin 9  Wbc 21.3 hgb 9,5 plt 268   Urine analysis with SG 1,014, negative protein, >50 rbc, 0-5 wbc.   Chest radiograph with bibasilar atelectasis.   EKG 67 bpm, left axis deviation, left anterior fascicular block, sinus rhythm with no significant ST segment or T wave changes.   CT abdomen and pelvis with fluid filled loops of small bowel involving the distal jejunum and proximal ileum. Consistent with partial small bowel obstruction.  Foley catheter is noted within the substance of the prostate with significant decompression of the bladder.   Patient was placed on IV fluids.  Foley cathter was placed, obtaining 5.8 L of urine.   12/14 positive hematuria, foley catheter passing through prostate. Catheter was exchange with good toleration.  Renal function improving Worsening mentation, with delirium.  Code status changed to DNR.   12/15 patient with improvement in renal function and mentation.  12/16 mentation and renal function are improving, patient will need SNF at  discharge.  12/17 discontinue IV fluids, hematuria has resolved. Pending transfer to SNF, will need outpatient follow up with urology.   Assessment and Plan: * AKI (acute kidney injury) ([place]) Hyperkalemia, hyperphosphatemia, hyperNatremia, hyperchloremia.  Uremia on admission.   Documented urine output is 2.125 ml (post obstruction polyuria).  Peripheral edema has improved, his fluid balance since admission is negative -11,620 ml.  Patient is tolerating po well.   Follow up renal function today with serum cr at 0,74, K is 3,8 and serum bicarbonate at 29. Na 145 and CL 112. BUN down to 24.   Plan to hold on IV fluids today, he has been on D5W with good toleration. Follow up renal function and electrolytes in am.  Nutritional support.  Ileus ([place]) Clinically has resolved, patient is tolerating po well.   Acute metabolic encephalopathy Acute delirium resolved.  Seems to be back to baseline today.   Electrolytes continue to improve. Plan to continue to encourage po intake, continue nutritional supplements including vitamins.  Add thiamine supplementation while in the hospital.  Patient will need SNF  Essential hypertension Echocardiogram with poor windows, but seems to have preserved LV systolic function per my interpretation.  Volume status has improved, post obstructive diuresis.   Blood pressure 140 to 137 mmHg, his antihypertensive medications have been on hold. At home he is on atenolol and lisinopril.  Will start patient on amlodipine 5 mg and have close follow up on blood pressure.   Acute respiratory failure with hypoxia (HCC) Multifactorial, likely related to abdominal distention and bibasilar atelectasis.  Today documented 02 saturation 97% on room air at rest.   Continue as needed supplemental 02 per [place] to keep 02 saturation 92% or greater.  Incentive spirometer.   Non-small cell lung cancer, Rt middle Lobe Sp resection right VATS, in 2009    Dyslipidemia Continue with statin therapy.   Femur fracture, left (HCC) Recent right hip fracture,  Follow with Pt and Ot Pain control with acetaminophen.  Avoid narcotic analgesics.    Prostate cancer ([place]) Positive outlet bladder obstruction Positive traumatic hematuria with acute blood loss anemia.  Foley catheter has been exchanged.  Today his urine is clear with no further macroscopic hematuria.   Follow up hgb is 8,1 and leukocytosis (reactive) has resolved.  Continue to hold on aspirin.         Subjective: Patient is feeling better, no chest pain or dyspnea, tolerating po well, with no nausea or vomiting, no abdominal pain.   Physical Exam: Vitals:   08/01/22 1647 08/01/22 2052 08/02/22 0429 08/02/22 0748  BP: 129/71 (!) 140/69 136/67 137/65  Pulse: 91 80 83 85  Resp: 16 16 17 17   Temp: 97.7 F (36.5 C) 98.9 F (37.2 C) 97.9 F (36.6 C) 99.2 F (37.3 C)  TempSrc: Oral Oral Oral Oral  SpO2: 95% 96% 95% 97%  Weight:   97.9 kg    Neurology awake and alert, no confusion or agitation  ENT with mild pallor Cardiovascular with S1 and S2 present and rhythmic with no gallops, rubs or murmurs Respiratory with no rales or wheezing Abdomen with no distention  Trace lower extremity edema (ted hose in place).  Data Reviewed:    Family Communication: I spoke with patient's daughter over the phone, we talked in detail about patient's condition, plan of care and prognosis and all questions were addressed.  Disposition: Status is: Inpatient Remains inpatient appropriate because: Pending placement   Planned Discharge Destination: Skilled nursing facility    Author: Tawni Millers, MD 08/02/2022 9:09 AM  For on call review [email or web address].

## 2022-08-02 NOTE — TOC Progression Note (Signed)
Transition of Care [facility]) - Progression Note    Patient Details  Name: John Riley MRN: [id number] Date of Birth: 15-Dec-1931  Transition of Care [facility]) CM/SW Contact  Shalawn Wynder, [place], [place] Phone Number: 08/02/2022, 10:18 AM  Clinical Narrative:    Everlene Balls Authorization started for short term rehab- Ref# [id number]  [place], LCSW Transition of Care    Expected Discharge Plan: [place] Barriers to Discharge: Continued Medical Work up, Ship broker, SNF Pending bed offer  Expected Discharge Plan and Services Expected Discharge Plan: [place]: [place] arrangements for the past 2 months: Apartment                                       Social Determinants of Health (SDOH) Interventions    Readmission Risk Interventions     No data to display

## 2022-08-03 ENCOUNTER — Encounter: Payer: Medicare HMO | Admitting: Surgical

## 2022-08-03 DIAGNOSIS — C3490 Malignant neoplasm of unspecified part of unspecified bronchus or lung: Secondary | ICD-10-CM | POA: Diagnosis not present

## 2022-08-03 DIAGNOSIS — J9601 Acute respiratory failure with hypoxia: Secondary | ICD-10-CM | POA: Diagnosis not present

## 2022-08-03 DIAGNOSIS — D508 Other iron deficiency anemias: Secondary | ICD-10-CM | POA: Diagnosis not present

## 2022-08-03 DIAGNOSIS — N179 Acute kidney failure, unspecified: Secondary | ICD-10-CM | POA: Diagnosis not present

## 2022-08-03 DIAGNOSIS — L89152 Pressure ulcer of sacral region, stage 2: Secondary | ICD-10-CM | POA: Diagnosis not present

## 2022-08-03 DIAGNOSIS — L89626 Pressure-induced deep tissue damage of left heel: Secondary | ICD-10-CM | POA: Diagnosis not present

## 2022-08-03 DIAGNOSIS — Z7401 Bed confinement status: Secondary | ICD-10-CM | POA: Diagnosis not present

## 2022-08-03 DIAGNOSIS — M25552 Pain in left hip: Secondary | ICD-10-CM | POA: Diagnosis not present

## 2022-08-03 DIAGNOSIS — I951 Orthostatic hypotension: Secondary | ICD-10-CM | POA: Diagnosis not present

## 2022-08-03 DIAGNOSIS — K567 Ileus, unspecified: Secondary | ICD-10-CM | POA: Diagnosis not present

## 2022-08-03 DIAGNOSIS — J9600 Acute respiratory failure, unspecified whether with hypoxia or hypercapnia: Secondary | ICD-10-CM | POA: Diagnosis not present

## 2022-08-03 DIAGNOSIS — R404 Transient alteration of awareness: Secondary | ICD-10-CM | POA: Diagnosis not present

## 2022-08-03 DIAGNOSIS — E46 Unspecified protein-calorie malnutrition: Secondary | ICD-10-CM | POA: Diagnosis not present

## 2022-08-03 DIAGNOSIS — I5032 Chronic diastolic (congestive) heart failure: Secondary | ICD-10-CM | POA: Diagnosis not present

## 2022-08-03 DIAGNOSIS — I10 Essential (primary) hypertension: Secondary | ICD-10-CM | POA: Diagnosis not present

## 2022-08-03 DIAGNOSIS — M1712 Unilateral primary osteoarthritis, left knee: Secondary | ICD-10-CM | POA: Diagnosis not present

## 2022-08-03 DIAGNOSIS — Z79899 Other long term (current) drug therapy: Secondary | ICD-10-CM | POA: Diagnosis not present

## 2022-08-03 DIAGNOSIS — N189 Chronic kidney disease, unspecified: Secondary | ICD-10-CM | POA: Diagnosis not present

## 2022-08-03 DIAGNOSIS — S72142D Displaced intertrochanteric fracture of left femur, subsequent encounter for closed fracture with routine healing: Secondary | ICD-10-CM | POA: Diagnosis not present

## 2022-08-03 DIAGNOSIS — K5900 Constipation, unspecified: Secondary | ICD-10-CM | POA: Diagnosis not present

## 2022-08-03 DIAGNOSIS — L89623 Pressure ulcer of left heel, stage 3: Secondary | ICD-10-CM | POA: Diagnosis not present

## 2022-08-03 DIAGNOSIS — E876 Hypokalemia: Secondary | ICD-10-CM | POA: Diagnosis not present

## 2022-08-03 DIAGNOSIS — E785 Hyperlipidemia, unspecified: Secondary | ICD-10-CM | POA: Diagnosis not present

## 2022-08-03 DIAGNOSIS — C61 Malignant neoplasm of prostate: Secondary | ICD-10-CM | POA: Diagnosis not present

## 2022-08-03 DIAGNOSIS — G9341 Metabolic encephalopathy: Secondary | ICD-10-CM | POA: Diagnosis not present

## 2022-08-03 DIAGNOSIS — R69 Illness, unspecified: Secondary | ICD-10-CM | POA: Diagnosis not present

## 2022-08-03 LAB — BASIC METABOLIC PANEL
Anion gap: 4 — ABNORMAL LOW (ref 5–15)
BUN: 15 mg/dL (ref 8–23)
CO2: 27 mmol/L (ref 22–32)
Calcium: 7.7 mg/dL — ABNORMAL LOW (ref 8.9–10.3)
Chloride: 112 mmol/L — ABNORMAL HIGH (ref 98–111)
Creatinine, Ser: 0.58 mg/dL — ABNORMAL LOW (ref 0.61–1.24)
GFR, Estimated: >60 mL/min (ref >60)
Glucose, Bld: 106 mg/dL — ABNORMAL HIGH (ref 70–99)
Potassium: 3.7 mmol/L (ref 3.5–5.1)
Sodium: 143 mmol/L (ref 135–145)

## 2022-08-03 MED ORDER — HYDROCODONE-ACETAMINOPHEN 5-325 MG PO TABS: 1.0000 | ORAL_TABLET | Freq: Two times a day (BID) | ORAL | 0 refills | Status: DC

## 2022-08-03 MED ORDER — TAMSULOSIN HCL 0.4 MG PO CAPS: 0.4000 mg | ORAL_CAPSULE | Freq: Every day | ORAL | Status: DC

## 2022-08-03 MED ORDER — AMLODIPINE BESYLATE 5 MG PO TABS: 5.0000 mg | ORAL_TABLET | Freq: Every day | ORAL | Status: DC

## 2022-08-03 NOTE — TOC Transition Note (Signed)
Transition of Care [facility]) - CM/SW Discharge Note   Patient Details  Name: TERIN CRAGLE MRN: [id number] Date of Birth: 07/19/1932  Transition of [facility]) CM/SW Contact:  [name], [place] Phone Number: 08/03/2022, 12:31 PM   Clinical Narrative:    Pt to be transported to [facility] via PT[facility]. Nurse to call report to [phone].   Final next level of care: Skilled Nursing Facility Barriers to Discharge: Barriers Resolved   Patient Goals and CMS Choice Patient states their goals for this hospitalization and ongoing recovery are:: Pt disoriented and unable to participate in goal setting at this time. CMS Medicare.gov Compare Post Acute Care list provided to:: Patient Represenative (must comment) Choice offered to / list presented to : Adult Children  Discharge Placement              Patient chooses bed at: [place], [place] Patient to be transferred to facility by: [place] Name of family member notified: [name] Patient and family notified of of transfer: 08/03/22  Discharge Plan and Services     Post Acute Care Choice: [place]                               Social Determinants of Health (SDOH) Interventions     Readmission Risk Interventions     No data to display

## 2022-08-03 NOTE — Care Management Important Message (Signed)
Important Message  Patient Details  Name: John Riley MRN: [id number] Date of Birth: 04-Sep-1931   Medicare Important Message Given:  Yes     Shelda Altes 08/03/2022, 12:49 PM

## 2022-08-03 NOTE — Progress Notes (Signed)
Report called to [facility] RN at [facility] [facility] [phone].  Pt to transport to SNF via PTAR. No estimated pick up time given.

## 2022-08-03 NOTE — Progress Notes (Signed)
Pt transported off unit via PTAR gurney. Foley remains intact per MD order. Pt is alert and oriented at baseline on 2L [place].

## 2022-08-03 NOTE — Discharge Summary (Signed)
Physician Discharge Summary  John Riley JJK:[id number] DOB: 1932-03-10 DOA: 07/28/2022  PCP: Dorothyann Peng, NP  Admit date: 07/28/2022 Discharge date: 08/03/2022  Admitted From: [facility] Disposition:  [facility]  Recommendations for Outpatient Follow-up:  Follow up with PCP in 1-2 weeks Needs follow-up with urology for urinary retention requiring Foley catheter Started on amlodipine for elevated blood pressure Started on tamsulosin for likely BPH leading to acute urinary obstruction Recommend repeat CBC/BMP 1 week.  Discharge Condition: Stable CODE STATUS: DNR Diet recommendation: Heart healthy diet  History of present illness:  John Riley is a 88 year old male with past medical history significant for essential hypertension, dyslipidemia, recent left hip fracture s/p ORIF 07/17/2022 who presented to [facility] ED via EMS on 12/12 with progressive lower extremity edema and oxygen requirements.  Patient reported a mild cough but denies any shortness of breath, no chest pain, no fevers or chills.  In the ED, patient was afebrile with SpO2 in the mid 90s on 2 L nasal cannula.  Blood work notable for BUN 128, creatinine 4.41, potassium 5.8, albumin 2.3, WBC 21.3 and hemoglobin 9.5.  EKG with sinus rhythm with PACs.  Renal ultrasound negative for hydronephrosis.  CT chest/abdomen/pelvis concerning for partial small bowel obstruction, mild fullness of the renal collecting systems without hydronephrosis.  Nephrology and general surgery were consulted.  Blood cultures obtained in the ED and patient was treated with 500 cc of normal saline, Lokelma and cefepime.  Hospital course:  Acute renal failure Acute urinary retention Patient presenting to ED with increased lower extremity edema and was found to have a creatinine of 4.41.  CT abdomen/pelvis with mild fullness of the collecting system with no clear hydronephrosis.  Foley catheter was placed with improved urinary output and  resolution of renal failure with creatinine improved to 0.58 at time of discharge.  Started on tamsulosin 0.4 mg p.o. daily.  Will continue Foley catheter on discharge.  Outpatient follow-up with urology.  Hyperkalemia: Resolved Patient with potassium 5.9 on admission, likely secondary to acute renal failure in the setting of urinary retention.  Patient received Lokelma and Foley catheter was placed with improvement of renal function.  Potassium 3.7 at time of discharge.  Recommend repeat BMP 1 week.  Ileus: Resolved CT abdomen/pelvis concerning for partial small bowel obstruction.  Seen by general surgery and treated conservatively with resolution.  Leukocytosis WBC count 21.3 on admission, no fever or localized infectious source.  Likely reactive in the setting of renal failure as above.  Did receive 1 dose of cefepime in the ED and further antibiotics were held.  Essential hypertension Started on amlodipine 5 mg p.o. daily.  Continue monitor BP closely and adjust accordingly.  Hyperlipidemia Continue simvastatin 40 mg p.o. daily  Hx non-small cell lung cancer, right middle lobe Underwent resection with right VATS 2009  Femur fracture, left Underwent recent ORIF.  Outpatient follow-up with orthopedics.  Prostate cancer Outpatient follow-up with urology.   Discharge Diagnoses:  Principal Problem:   AKI (acute kidney injury) ([place] [facility]) Active Problems:   Ileus ([place])   Essential hypertension   Acute metabolic encephalopathy   Acute respiratory failure with hypoxia (H[facility])   Non-small cell lung cancer, Rt middle Lobe   Dyslipidemia   Femur fracture, left ([place])   Prostate cancer [facility])    Discharge Instructions  Discharge Instructions     Call MD for:  difficulty breathing, headache or visual disturbances   Complete by: As directed    Call MD for:  extreme  fatigue   Complete by: As directed    Call MD for:  persistant dizziness or light-headedness   Complete by: As directed     Call MD for:  persistant nausea and vomiting   Complete by: As directed    Call MD for:  severe uncontrolled pain   Complete by: As directed    Call MD for:  temperature >100.4   Complete by: As directed    Continue foley catheter   Complete by: As directed    Diet - low sodium heart healthy   Complete by: As directed    Increase activity slowly   Complete by: As directed    No wound care   Complete by: As directed       Allergies as of 08/03/2022       Reactions   Niacin         Medication List     STOP taking these medications    atenolol 100 MG tablet Commonly known as: TENORMIN   bisacodyl 5 MG EC tablet Commonly known as: DULCOLAX   lisinopril 40 MG tablet Commonly known as: ZESTRIL       TAKE these medications    acetaminophen 500 MG tablet Commonly known as: TYLENOL Take 1,000 mg by mouth every 6 (six) hours as needed for mild pain.   albuterol (2.5 MG/3ML) 0.083% nebulizer solution Commonly known as: PROVENTIL Take 2.5 mg by nebulization in the morning and at bedtime.   amLODipine 5 MG tablet Commonly known as: NORVASC Take 1 tablet (5 mg total) by mouth daily. Start taking on: August 04, 2022   aspirin EC 81 MG tablet Take 1 tablet (81 mg total) by mouth 2 (two) times daily. Swallow whole.   cyclobenzaprine 10 MG tablet Commonly known as: FLEXERIL Take 1 tablet (10 mg total) by mouth at bedtime.   docusate sodium 100 MG capsule Commonly known as: COLACE Take 1 capsule (100 mg total) by mouth 2 (two) times daily.   guaifenesin 100 MG/5ML syrup Commonly known as: ROBITUSSIN Take 200 mg by mouth 3 (three) times daily as needed for cough.   HYDROcodone-acetaminophen 5-325 MG tablet Commonly known as: NORCO/VICODIN Take 1 tablet by mouth in the morning and at bedtime. May also give 1 tablet every 4 hours as needed for moderate pain   polyethylene glycol 17 g packet Commonly known as: MIRALAX / GLYCOLAX Take 17 g by mouth daily.    PRO-STAT PO Take 30 mLs by mouth in the morning and at bedtime.   senna 8.6 MG Tabs tablet Commonly known as: SENOKOT Take 1 tablet (8.6 mg total) by mouth 2 (two) times daily.   simvastatin 40 MG tablet Commonly known as: ZOCOR Take 1 tablet (40 mg total) by mouth at bedtime.   tamsulosin 0.4 MG Caps capsule Commonly known as: FLOMAX Take 1 capsule (0.4 mg total) by mouth daily after supper.        Follow-up Information     Dorothyann Peng, NP. Schedule an appointment as soon as possible for a visit in 1 week(s).   Specialty: Family Medicine Contact information: [address] [place] [address] [phone]         [facility]. Schedule an appointment as soon as possible for a visit.   Contact information: [place] [phone]               Allergies  Allergen Reactions   Niacin  Consultations: Nephrology General surgery   Procedures/Studies: ECHOCARDIOGRAM LIMITED  Result Date: 07/30/2022    ECHOCARDIOGRAM LIMITED REPORT   Patient Name:   John Riley Date of Exam: 07/30/2022 Medical Rec #:  [id number]        Height:       70.7 in Accession #:    [id number]       Weight:       203.0 lb Date of Birth:  1931-10-04        BSA:          2.117 m Patient Age:    17 years         BP:           112/65 mmHg Patient Gender: M                HR:           81 bpm. Exam Location:  Inpatient Procedure: Limited Echo Indications:    Dyspnea  History:        Patient has prior history of Echocardiogram examinations, most                 recent 09/28/2012. Risk Factors:Former Smoker, Hypertension and                 Dyslipidemia. AKI.  Sonographer:    Clayton Lefort RDCS (AE) Referring Phys: [id number] [place]  Sonographer Comments: Technically challenging study due to limited acoustic windows, Technically difficult study due to poor echo windows, no parasternal window, no apical window and  no subcostal window. No diagnostic imaging windows discovered by this sonographer.  Uninterpretable study. Sanda Klein MD Electronically signed by Sanda Klein MD Signature Date/Time: 07/30/2022/5:44:29 PM    Final    CT CHEST ABDOMEN PELVIS WO CONTRAST  Result Date: 07/28/2022 CLINICAL DATA:  Shortness of breath and abdominal pain, initial encounter EXAM: CT CHEST, ABDOMEN AND PELVIS WITHOUT CONTRAST TECHNIQUE: Multidetector CT imaging of the chest, abdomen and pelvis was performed following the standard protocol without IV contrast. RADIATION DOSE REDUCTION: This exam was performed according to the departmental dose-optimization program which includes automated exposure control, adjustment of the mA and/or kV according to patient size and/or use of iterative reconstruction technique. COMPARISON:  Chest x-ray from earlier in the same day. FINDINGS: CT CHEST FINDINGS Cardiovascular: Somewhat limited due to lack of IV contrast. Atherosclerotic calcifications of the thoracic aorta are noted. Decreased attenuation is noted in the cardiac blood pool suggestive of underlying anemia. Coronary calcifications are seen. Pulmonary artery as visualized is within normal limits. Mediastinum/Nodes: Thoracic inlet is unremarkable. No hilar or mediastinal adenopathy is noted. The esophagus is within normal limits. Lungs/Pleura: Bibasilar atelectasis is noted. Mild inspissated material is noted within the right lower lobe bronchial tree which may represent aspiration. Mild emphysematous changes are seen. No parenchymal nodule is noted. Musculoskeletal: Degenerative changes of the thoracic spine are seen. CT ABDOMEN PELVIS FINDINGS Hepatobiliary: Gallbladder is well distended with dependent gallstones. Liver is within normal limits. Pancreas: Unremarkable. No pancreatic ductal dilatation or surrounding inflammatory changes. Spleen: Normal in size without focal abnormality. Adrenals/Urinary Tract: Adrenal glands are  unremarkable. Kidneys demonstrate mild fullness of the collecting systems bilaterally although no significant ureteral dilatation is seen. Increased perinephric stranding is noted on the left. Significant decompression of the bladder is noted with some surrounding of the dilated loops of small bowel described below. Foley catheter is noted within the substance of the prostate. Stomach/Bowel: Scattered diverticular change of the colon  is noted without evidence of diverticulitis. Multiple dilated loops of small bowel are noted involving the distal jejunum and proximal to mid ileum. The distal ileum appears within normal limits. A transition zone is noted deep in the pelvis best seen on image number 111 of series 3. The decompressed bladder surrounds the area of transition in the low pelvis. The stomach and proximal jejunum appear within normal limits. Vascular/Lymphatic: Aortic atherosclerosis. No enlarged abdominal or pelvic lymph nodes. Reproductive: Prostate is unremarkable. Other: Minimal free fluid is noted within the pelvis. Mild changes of anasarca are seen. Musculoskeletal: Degenerative changes of the lumbar spine are noted. No acute bony abnormality is noted. Changes of recent left hip fixation are seen. IMPRESSION: Multiple dilated fluid-filled loops of small bowel involving the distal jejunum and proximal ileum. This is consistent with at least a partial small bowel obstruction. Transition zone is noted deep in the pelvis as described above likely related to adhesions. Foley catheter is noted within the substance of the prostate with significant decompression of the bladder identified. Cholelithiasis without complicating factors. Mild fullness of the collecting systems although no true hydronephrosis is seen. Mild bibasilar atelectasis with inspissated material within the lower lobe bronchial tree on the right which may represent some aspiration. Electronically Signed   By: Inez Catalina M.D.   On:  07/28/2022 19:51   US Renal  Result Date: 07/28/2022 CLINICAL DATA:  Renal failure EXAM: RENAL / URINARY TRACT ULTRASOUND COMPLETE COMPARISON:  None Available. FINDINGS: Right Kidney: Renal measurements: 12.5 x 6.0 x 4.8 cm = volume: 189 mL. Right upper pole cyst 2 cm. Right lower pole cyst 2.5 cm. Echogenicity within normal limits. No mass or hydronephrosis visualized. Left Kidney: Renal measurements: 13.1 x 7.3 x 4.3 cm = volume: 213 mL. Left upper pole cyst 2.4 cm. Echogenicity within normal limits. No mass or hydronephrosis visualized. Bladder: Appears normal for degree of bladder distention. Other: None. IMPRESSION: Negative for hydronephrosis.  Bilateral renal cysts Electronically Signed   By: Franchot Gallo M.D.   On: 07/28/2022 18:22   DG Chest 1 View  Result Date: 07/28/2022 CLINICAL DATA:  Shortness of breath.  Bilateral leg swelling. EXAM: CHEST  1 VIEW COMPARISON:  AP chest 07/20/2022, 06/20/2022; chest two views 05/31/2022; CT chest 04/09/2010 FINDINGS: Cardiac silhouette is again mildly enlarged. There is again a tortuous thoracic aorta with mediastinal contours unchanged. There is again moderate right-sided volume loss, noting prior right lower lung partial resection on prior CT. Mild elevation of the right hemidiaphragm is unchanged. Mild right basilar linear subsegmental atelectasis and/or scarring is similar to prior. The left lung is clear. No definite pleural effusion. No pneumothorax. 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 IMPRESSION: 1. Postsurgical changes of partial right lower lung resection, chronic. 2. Mild right basilar subsegmental atelectasis and/or scarring, similar to prior. No acute lung process is seen. Electronically Signed   By: Yvonne Kendall M.D.   On: 07/28/2022 15:48   DG CHEST PORT 1 VIEW  Result Date: 07/20/2022 CLINICAL DATA:  Hypoxia EXAM: PORTABLE CHEST 1 VIEW COMPARISON:  07/16/2022 FINDINGS: Heart size upper limits of normal. Tortuous  aorta as seen previously. The left lung remains clear. Previous pulmonary resection at the right base with chronic scarring and elevation of the hemidiaphragm. No sign acute infiltrate, edema or effusion. No abnormal bone finding. IMPRESSION: No active disease. Previous pulmonary resection at the right base. Chronic scarring and elevation of the right hemidiaphragm. Electronically Signed   By: Jan Fireman.D.  On: 07/20/2022 13:36   DG FEMUR MIN 2 VIEWS LEFT  Result Date: 07/18/2022 CLINICAL DATA:  ORIF femoral neck fracture EXAM: LEFT FEMUR 4  VIEWS COMPARISON:  07/16/2022. FINDINGS: There has been interval ORIF with an intramedullary rod and left femoral neck compression screws. Grossly anatomic alignment with approximately 2 cm displacement. IMPRESSION: Status post ORIF left femoral neck fracture. Electronically Signed   By: Sammie Bench M.D.   On: 07/18/2022 01:53   DG FEMUR MIN 2 VIEWS LEFT  Result Date: 07/17/2022 CLINICAL DATA:  Intramedullary nail. EXAM: LEFT FEMUR 2 VIEWS COMPARISON:  Left femur x-ray 07/16/2022 FINDINGS: Intraoperative left hip. Three low resolution intraoperative spot views of the left hip were obtained. There is a new left femoral intramedullary nail and hip screw fixating intratrochanteric fracture. Alignment is anatomic. Total fluoroscopy time: 74.8 seconds Total radiation dose: 4.12 micro Gy IMPRESSION: Intraoperative left femoral intramedullary nail and hip screw fixating intratrochanteric fracture. Electronically Signed   By: Ronney Asters M.D.   On: 07/17/2022 19:44   DG C-Arm 1-60 Min-No Report  Result Date: 07/17/2022 Fluoroscopy was utilized by the requesting physician.  No radiographic interpretation.   DG Chest 1 View  Result Date: 07/16/2022 CLINICAL DATA:  Fall from standing EXAM: CHEST  1 VIEW COMPARISON:  Radiographs 05/31/2012 FINDINGS: No displaced rib fractures. No pneumothorax or pleural effusion. Elevated right hemidiaphragm. Bibasilar  atelectasis/consolidation. Biapical scarring. Postoperative changes right lower lung. Widening of the superior mediastinum is likely due to patient leftward rotation. IMPRESSION: No acute abnormality. Electronically Signed   By: Placido Sou M.D.   On: 07/16/2022 20:03   DG Pelvis 1-2 Views  Result Date: 07/16/2022 CLINICAL DATA:  Left hip pain after fall. EXAM: PELVIS - 1-2 VIEW; LEFT FEMUR 2 VIEWS COMPARISON:  None Available. FINDINGS: Acute left intertrochanteric femur fracture. No dislocation. Degenerative changes of the hips and left knee. Soft tissues are unremarkable. IMPRESSION: 1. Acute left intertrochanteric femur fracture. Electronically Signed   By: Titus Dubin M.D.   On: 07/16/2022 20:02   DG Femur Min 2 Views Left  Result Date: 07/16/2022 CLINICAL DATA:  Left hip pain after fall. EXAM: PELVIS - 1-2 VIEW; LEFT FEMUR 2 VIEWS COMPARISON:  None Available. FINDINGS: Acute left intertrochanteric femur fracture. No dislocation. Degenerative changes of the hips and left knee. Soft tissues are unremarkable. IMPRESSION: 1. Acute left intertrochanteric femur fracture. Electronically Signed   By: Titus Dubin M.D.   On: 07/16/2022 20:02     Subjective: Patient seen examined bedside, resting comfortably.  Lying in bed.  Renal function remained stable.  Discharging back to SNF today.  No other specific complaints or concerns at this time.  Denies headache, no dizziness, no chest pain, palpitations, no shortness of breath, no abdominal pain, no focal weakness, no fever/chills/night sweats, no nausea/vomiting/diarrhea, no paresthesias.  No acute events overnight per nursing staff.  Discharge Exam: Vitals:   08/03/22 0432 08/03/22 0912  BP: 129/62 122/73  Pulse: 97 93  Resp: 20 17  Temp: 99.2 F (37.3 C) 98.2 F (36.8 C)  SpO2: 97% 96%   Vitals:   08/02/22 2003 08/03/22 0432 08/03/22 0433 08/03/22 0912  BP: (!) 108/93 129/62  122/73  Pulse: 91 97  93  Resp: 18 20  17   Temp:  98.4 F (36.9 C) 99.2 F (37.3 C)  98.2 F (36.8 C)  TempSrc: Oral Oral  Oral  SpO2: 96% 97%  96%  Weight:   95.4 kg     Physical Exam: GEN:  NAD, alert and oriented x 3, wd/wn HEENT: NCAT, PERRL, EOMI, sclera clear, MMM PULM: CTAB w/o wheezes/crackles, normal respiratory effort, on room air CV: RRR w/o M/G/R GU: Foley catheter noted draining clear yellow urine in collection bag GI: abd soft, NTND, NABS, no R/G/M MSK: no peripheral edema, muscle strength globally intact 5/5 bilateral upper/lower extremities NEURO: CN II-XII intact, no focal deficits, sensation to light touch intact PSYCH: normal mood/affect Integumentary: dry/intact, no rashes or wounds    The results of significant diagnostics from this hospitalization (including imaging, microbiology, ancillary and laboratory) are listed below for reference.     Microbiology: Recent Results (from the past 240 hour(s))  Blood culture (routine x 2)     Status: None   Collection Time: 07/28/22  4:31 PM   Specimen: BLOOD RIGHT HAND  Result Value Ref Range Status   Specimen Description BLOOD RIGHT HAND  Final   Special Requests   Final    BOTTLES DRAWN AEROBIC ONLY Blood Culture results may not be optimal due to an inadequate volume of blood received in culture bottles   Culture   Final    NO GROWTH 5 DAYS Performed at [facility], [place] [address], [place], [place] [address]    Report Status 08/02/2022 FINAL  Final  Blood culture (routine x 2)     Status: None   Collection Time: 07/28/22  6:50 PM   Specimen: BLOOD  Result Value Ref Range Status   Specimen Description BLOOD SITE NOT SPECIFIED  Final   Special Requests   Final    BOTTLES DRAWN AEROBIC AND ANAEROBIC Blood Culture results may not be optimal due to an inadequate volume of blood received in culture bottles   Culture   Final    NO GROWTH 5 DAYS Performed at [facility], [place] [address], [place], [place] [address]    Report Status 08/02/2022 FINAL   Final     Labs: BNP (last 3 results) Recent Labs    07/28/22 1449  BNP 973.5*   Basic Metabolic Panel: Recent Labs  Lab 07/29/22 0530 07/30/22 0231 07/31/22 0308 08/01/22 0204 08/02/22 0159 08/03/22 0314  NA 143 149* 154* 149* 145 143  K 5.2* 4.8 4.3 4.0 3.8 3.7  CL 108 113* 119* 113* 112* 112*  CO2 27 28 29 27 29 27   GLUCOSE 105* 150* 102* 123* 124* 106*  BUN 100* 94* 79* 44* 24* 15  CREATININE 2.24* 1.30* 0.95 0.77 0.74 0.58*  CALCIUM 8.1* 8.5* 8.3* 7.7* 7.6* 7.7*  MG 3.3*  --   --   --   --   --   PHOS 5.1*  --   --   --   --   --    Liver Function Tests: Recent Labs  Lab 07/28/22 1449  AST 28  ALT 20  ALKPHOS 99  BILITOT 1.3*  PROT 5.9*  ALBUMIN 2.3*   No results for input(s): "LIPASE", "AMYLASE" in the last 168 hours. No results for input(s): "AMMONIA" in the last 168 hours. CBC: Recent Labs  Lab 07/28/22 1449 07/28/22 1607 07/29/22 0530 07/30/22 0231 07/31/22 0308 08/01/22 0204 08/02/22 0159  WBC 21.3*  --  15.1* 18.0* 10.6*  --  9.5  NEUTROABS 19.6*  --   --   --   --   --  7.6  HGB 9.5*   < > 9.5* 9.4* 9.0* 8.0* 8.1*  HCT 29.8*   < > 28.0* 29.7* 28.9* 25.0* 26.0*  MCV 102.1*  --  97.9 102.4* 106.6*  --  104.8*  PLT 268  --  265 385 338  --  290   < > = values in this interval not displayed.   Cardiac Enzymes: No results for input(s): "CKTOTAL", "CKMB", "CKMBINDEX", "TROPONINI" in the last 168 hours. BNP: Invalid input(s): "POCBNP" CBG: Recent Labs  Lab 07/30/22 1333  GLUCAP 131*   D-Dimer No results for input(s): "DDIMER" in the last 72 hours. Hgb A1c No results for input(s): "HGBA1C" in the last 72 hours. Lipid Profile No results for input(s): "CHOL", "HDL", "LDLCALC", "TRIG", "CHOLHDL", "LDLDIRECT" in the last 72 hours. Thyroid function studies No results for input(s): "TSH", "T4TOTAL", "T3FREE", "THYROIDAB" in the last 72 hours.  Invalid input(s): "FREET3" Anemia work up No results for input(s): "VITAMINB12", "FOLATE",  "FERRITIN", "TIBC", "IRON", "RETICCTPCT" in the last 72 hours. Urinalysis    Component Value Date/Time   COLORURINE YELLOW 07/28/2022 1707   APPEARANCEUR HAZY (A) 07/28/2022 1707   LABSPEC 1.014 07/28/2022 1707   PHURINE 5.0 07/28/2022 1707   GLUCOSEU NEGATIVE 07/28/2022 1707   HGBUR LARGE (A) 07/28/2022 1707   BILIRUBINUR NEGATIVE 07/28/2022 1707   KETONESUR NEGATIVE 07/28/2022 1707   PROTEINUR NEGATIVE 07/28/2022 1707   UROBILINOGEN 0.2 02/21/2008 1113   NITRITE NEGATIVE 07/28/2022 1707   LEUKOCYTESUR NEGATIVE 07/28/2022 1707   Sepsis Labs Recent Labs  Lab 07/29/22 0530 07/30/22 0231 07/31/22 0308 08/02/22 0159  WBC 15.1* 18.0* 10.6* 9.5   Microbiology Recent Results (from the past 240 hour(s))  Blood culture (routine x 2)     Status: None   Collection Time: 07/28/22  4:31 PM   Specimen: BLOOD RIGHT HAND  Result Value Ref Range Status   Specimen Description BLOOD RIGHT HAND  Final   Special Requests   Final    BOTTLES DRAWN AEROBIC ONLY Blood Culture results may not be optimal due to an inadequate volume of blood received in culture bottles   Culture   Final    NO GROWTH 5 DAYS Performed at [facility], [place] [address] [place], [place] [address]    Report Status 08/02/2022 FINAL  Final  Blood culture (routine x 2)     Status: None   Collection Time: 07/28/22  6:50 PM   Specimen: BLOOD  Result Value Ref Range Status   Specimen Description BLOOD SITE NOT SPECIFIED  Final   Special Requests   Final    BOTTLES DRAWN AEROBIC AND ANAEROBIC Blood Culture results may not be optimal due to an inadequate volume of blood received in culture bottles   Culture   Final    NO GROWTH 5 DAYS Performed at [facility], [place] [address], [place], [place] [address]    Report Status 08/02/2022 FINAL  Final     Time coordinating discharge: Over 30 minutes  SIGNED:   Taj Nevins J [place] DO  [facility] 08/03/2022, 11:12 AM

## 2022-08-03 NOTE — Consult Note (Addendum)
   [facility] Inpatient Consult   08/03/2022  NNAEMEKA SAMSON 1931-12-10 [id number]  [place] [facility] [ACO] Patient: John Riley HMO  Primary Care Provider:  Dorothyann Peng, NP, with [place] at [facility]   Patient was reviewed for less than 7 days readmission recent Femur Fracture now with AKI. For [facility] at [facility].  Patient is for a skilled nursing facility level of care at post hospital transition. Rounding on unit and patient is transitioning via [facility].  Plan:   Note that needs are to be met at a skilled nursing facility level of care for post hospital transition. No needs for [facility] CM.  For questions or referrals, please contact:   Natividad Brood, RN BSN [place]  [phone] business mobile phone Toll free office [phone]  *[place]  [phone] Fax number: [phone] [email or web address]

## 2022-08-04 DIAGNOSIS — Z79899 Other long term (current) drug therapy: Secondary | ICD-10-CM | POA: Diagnosis not present

## 2022-08-07 DIAGNOSIS — E46 Unspecified protein-calorie malnutrition: Secondary | ICD-10-CM | POA: Diagnosis not present

## 2022-08-07 DIAGNOSIS — J9600 Acute respiratory failure, unspecified whether with hypoxia or hypercapnia: Secondary | ICD-10-CM | POA: Diagnosis not present

## 2022-08-07 DIAGNOSIS — Z79899 Other long term (current) drug therapy: Secondary | ICD-10-CM | POA: Diagnosis not present

## 2022-08-07 DIAGNOSIS — M25552 Pain in left hip: Secondary | ICD-10-CM | POA: Diagnosis not present

## 2022-08-07 DIAGNOSIS — N189 Chronic kidney disease, unspecified: Secondary | ICD-10-CM | POA: Diagnosis not present

## 2022-08-07 DIAGNOSIS — N179 Acute kidney failure, unspecified: Secondary | ICD-10-CM | POA: Diagnosis not present

## 2022-08-07 DIAGNOSIS — K5900 Constipation, unspecified: Secondary | ICD-10-CM | POA: Diagnosis not present

## 2022-08-11 DIAGNOSIS — Z79899 Other long term (current) drug therapy: Secondary | ICD-10-CM | POA: Diagnosis not present

## 2022-08-14 DIAGNOSIS — D508 Other iron deficiency anemias: Secondary | ICD-10-CM | POA: Diagnosis not present

## 2022-08-14 DIAGNOSIS — Z79899 Other long term (current) drug therapy: Secondary | ICD-10-CM | POA: Diagnosis not present

## 2022-08-16 DIAGNOSIS — I951 Orthostatic hypotension: Secondary | ICD-10-CM | POA: Diagnosis not present

## 2022-08-16 DIAGNOSIS — E876 Hypokalemia: Secondary | ICD-10-CM | POA: Diagnosis not present

## 2022-08-21 ENCOUNTER — Ambulatory Visit (INDEPENDENT_AMBULATORY_CARE_PROVIDER_SITE_OTHER): Payer: Medicare HMO | Admitting: Surgical

## 2022-08-21 ENCOUNTER — Ambulatory Visit (INDEPENDENT_AMBULATORY_CARE_PROVIDER_SITE_OTHER): Payer: Medicare HMO

## 2022-08-21 DIAGNOSIS — M1712 Unilateral primary osteoarthritis, left knee: Secondary | ICD-10-CM

## 2022-08-21 DIAGNOSIS — L89152 Pressure ulcer of sacral region, stage 2: Secondary | ICD-10-CM | POA: Diagnosis not present

## 2022-08-21 DIAGNOSIS — L89626 Pressure-induced deep tissue damage of left heel: Secondary | ICD-10-CM | POA: Diagnosis not present

## 2022-08-21 DIAGNOSIS — S72002D Fracture of unspecified part of neck of left femur, subsequent encounter for closed fracture with routine healing: Secondary | ICD-10-CM

## 2022-08-21 DIAGNOSIS — D508 Other iron deficiency anemias: Secondary | ICD-10-CM | POA: Diagnosis not present

## 2022-08-21 DIAGNOSIS — Z79899 Other long term (current) drug therapy: Secondary | ICD-10-CM | POA: Diagnosis not present

## 2022-08-21 DIAGNOSIS — R69 Illness, unspecified: Secondary | ICD-10-CM | POA: Diagnosis not present

## 2022-08-21 DIAGNOSIS — I5032 Chronic diastolic (congestive) heart failure: Secondary | ICD-10-CM | POA: Diagnosis not present

## 2022-08-21 DIAGNOSIS — M25552 Pain in left hip: Secondary | ICD-10-CM | POA: Diagnosis not present

## 2022-08-23 ENCOUNTER — Encounter: Payer: Self-pay | Admitting: Surgical

## 2022-08-23 MED ORDER — BUPIVACAINE HCL 0.25 % IJ SOLN
4.0000 mL | INTRAMUSCULAR | Status: AC | PRN
  Administered 2022-08-21: 4 mL via INTRA_ARTICULAR

## 2022-08-23 MED ORDER — LIDOCAINE HCL 1 % IJ SOLN
5.0000 mL | INTRAMUSCULAR | Status: AC | PRN
  Administered 2022-08-21: 5 mL

## 2022-08-23 MED ORDER — METHYLPREDNISOLONE ACETATE 40 MG/ML IJ SUSP
40.0000 mg | INTRAMUSCULAR | Status: AC | PRN
  Administered 2022-08-21: 40 mg via INTRA_ARTICULAR

## 2022-08-23 NOTE — Progress Notes (Cosign Needed Addendum)
Post-Op Visit Note   Patient: John Riley           Date of Birth: Jun 06, 1932           MRN: [id number] Visit Date: 08/21/2022 PCP: Dorothyann Peng, NP   Assessment & Plan:  Chief Complaint:  Chief Complaint  Patient presents with   Left Hip - Pain   Visit Diagnoses:  1. Pain of left hip     Plan: Patient is an 89 year old male who presents s/p left hip intramedullary nail placement for left hip intertrochanteric femur fracture.  He is about a month out from surgery.  Shortly after being discharged from hospital for initial surgery, he had to be taken back to the hospital for acute renal failure and acute respiratory failure.  [facility] and reports that he has continued with nonweightbearing status.  His hip feels well with minimal discomfort.  No groin pain.  Does have a little bit of lateral distal thigh pain.  Most of his pain he feels in both of his knees.  He has a history of knee arthritis that he has had prior injections for had another orthopedic clinic.  He denies any fevers, chills, chest pain, shortness of breath.  On exam, patient has no discomfort with passive hip internal rotation or external rotation.  No discomfort with axial load of the left hip.  No evidence of infection or dehiscence of either incision.  No calf tenderness.  Negative Homans' sign.  Intact ankle dorsiflexion and plantarflexion.  Left foot is warm well-perfused.  No knee effusion noted.  Does have tenderness over the medial and lateral joint lines of the knee.  He is able to perform straight leg raise.  Not able to actively lift his left hip off of his wheelchair.  Radiographs taken of the left hip today demonstrate left hip intertrochanteric fracture in acceptable alignment given the patient's symptoms.  He is really not having any discomfort that seems related to his hip fracture and most of his pain today by his complaint seems related to his knee arthritis.   Offered injection and he agreed he would like to try this today.  Knee injection with cortisone successfully administered and he tolerated procedure well.  Plan is to continue with nonweightbearing status for 2 more weeks and then start partial weightbearing with a walker at that point.  Follow-up 1 week after he starts weightbearing (3 weeks from today).  We will check radiographs at that time.    Procedure Note  Patient: John Riley             Date of Birth: 04-04-1932           MRN: [id number]             Visit Date: 08/21/2022  Procedures: Visit Diagnoses:  1. Pain of left hip     Large Joint Inj: L knee on 08/21/2022 1:59 PM Indications: diagnostic evaluation, joint swelling and pain Details: 18 G 1.5 in needle, superolateral approach  Arthrogram: No  Medications: 5 mL lidocaine 1 %; 40 mg methylPREDNISolone acetate 40 MG/ML; 4 mL bupivacaine 0.25 % Outcome: tolerated well, no immediate complications Procedure, treatment alternatives, risks and benefits explained, specific risks discussed. Consent was given by the patient. Immediately prior to procedure a time out was called to verify the correct patient, procedure, equipment, support staff and site/side marked as required. Patient was prepped and draped in the usual sterile fashion.  Follow-Up Instructions: No follow-ups on file.   Orders:  Orders Placed This Encounter  Procedures   XR HIP UNILAT W OR W/O PELVIS 2-3 VIEWS LEFT   No orders of the defined types were placed in this encounter.   Imaging: No results found.  PMFS History: Patient Active Problem List   Diagnosis Date Noted   Acute metabolic encephalopathy 29/52/8413   AKI (acute kidney injury) ([place]) 07/28/2022   Acute respiratory failure with hypoxia ([place]) 07/28/2022   Leukocytosis 07/28/2022   Ileus ([place]) 07/28/2022   Hyperkalemia 24/40/1027   Metabolic acidosis 25/36/6440   Closed fracture of left hip ([place]) 07/18/2022   Femur  fracture, left ([place]) 07/16/2022   Cervical pain 07/16/2022   Impaired glucose tolerance 02/09/2014   Dyspnea 12/13/2012   Prostate cancer ([place]) 08/05/2012   Non-small cell lung cancer, Rt middle Lobe    INSOMNIA 12/07/2007   Dyslipidemia 06/28/2007   Essential hypertension 06/28/2007   Past Medical History:  Diagnosis Date   ELEVATED PROSTATE SPECIFIC ANTIGEN 07/31/2009   HYPERLIPIDEMIA 06/28/2007   HYPERTENSION 06/28/2007   INSOMNIA 12/07/2007   Non-small cell lung cancer ([place]) 2009   PULMONARY NODULE, RIGHT MIDDLE LOBE 12/07/2007   TOBACCO ABUSE 11/09/2007    No family history on file.  Past Surgical History:  Procedure Laterality Date   APPENDECTOMY     CATARACT EXTRACTION Left 10/30/2021   INTRAMEDULLARY (IM) NAIL INTERTROCHANTERIC Left 07/17/2022   Procedure: INTRAMEDULLARY (IM) NAIL INTERTROCHANTERIC;  Surgeon: Meredith Pel, MD;  Location: [place];  Service: Orthopedics;  Laterality: Left;   LOBECTOMY     right middle   PROSTATE SURGERY     bx   VEIN LIGATION AND STRIPPING Left 1974   Social History   Occupational History   Not on file  Tobacco Use   Smoking status: Former    Types: Cigarettes    Quit date: 08/18/2007    Years since quitting: 15.0   Smokeless tobacco: Never  Substance and Sexual Activity   Alcohol use: No   Drug use: No   Sexual activity: Not on file

## 2022-08-24 DIAGNOSIS — R69 Illness, unspecified: Secondary | ICD-10-CM | POA: Diagnosis not present

## 2022-08-24 DIAGNOSIS — I5032 Chronic diastolic (congestive) heart failure: Secondary | ICD-10-CM | POA: Diagnosis not present

## 2022-08-26 DIAGNOSIS — L89152 Pressure ulcer of sacral region, stage 2: Secondary | ICD-10-CM | POA: Diagnosis not present

## 2022-08-26 DIAGNOSIS — L89623 Pressure ulcer of left heel, stage 3: Secondary | ICD-10-CM | POA: Diagnosis not present

## 2022-09-02 DIAGNOSIS — L89623 Pressure ulcer of left heel, stage 3: Secondary | ICD-10-CM | POA: Diagnosis not present

## 2022-09-02 DIAGNOSIS — L89152 Pressure ulcer of sacral region, stage 2: Secondary | ICD-10-CM | POA: Diagnosis not present

## 2022-09-04 DIAGNOSIS — M6281 Muscle weakness (generalized): Secondary | ICD-10-CM | POA: Diagnosis not present

## 2022-09-04 DIAGNOSIS — C61 Malignant neoplasm of prostate: Secondary | ICD-10-CM | POA: Diagnosis not present

## 2022-09-04 DIAGNOSIS — M25562 Pain in left knee: Secondary | ICD-10-CM | POA: Diagnosis not present

## 2022-09-04 DIAGNOSIS — Z4789 Encounter for other orthopedic aftercare: Secondary | ICD-10-CM | POA: Diagnosis not present

## 2022-09-04 DIAGNOSIS — S72142D Displaced intertrochanteric fracture of left femur, subsequent encounter for closed fracture with routine healing: Secondary | ICD-10-CM | POA: Diagnosis not present

## 2022-09-04 DIAGNOSIS — R2681 Unsteadiness on feet: Secondary | ICD-10-CM | POA: Diagnosis not present

## 2022-09-04 DIAGNOSIS — R338 Other retention of urine: Secondary | ICD-10-CM | POA: Diagnosis not present

## 2022-09-04 DIAGNOSIS — R279 Unspecified lack of coordination: Secondary | ICD-10-CM | POA: Diagnosis not present

## 2022-09-04 DIAGNOSIS — C3490 Malignant neoplasm of unspecified part of unspecified bronchus or lung: Secondary | ICD-10-CM | POA: Diagnosis not present

## 2022-09-04 DIAGNOSIS — Z9181 History of falling: Secondary | ICD-10-CM | POA: Diagnosis not present

## 2022-09-08 DIAGNOSIS — C3490 Malignant neoplasm of unspecified part of unspecified bronchus or lung: Secondary | ICD-10-CM | POA: Diagnosis not present

## 2022-09-08 DIAGNOSIS — R2681 Unsteadiness on feet: Secondary | ICD-10-CM | POA: Diagnosis not present

## 2022-09-08 DIAGNOSIS — R279 Unspecified lack of coordination: Secondary | ICD-10-CM | POA: Diagnosis not present

## 2022-09-08 DIAGNOSIS — S72142D Displaced intertrochanteric fracture of left femur, subsequent encounter for closed fracture with routine healing: Secondary | ICD-10-CM | POA: Diagnosis not present

## 2022-09-08 DIAGNOSIS — Z4789 Encounter for other orthopedic aftercare: Secondary | ICD-10-CM | POA: Diagnosis not present

## 2022-09-08 DIAGNOSIS — M6281 Muscle weakness (generalized): Secondary | ICD-10-CM | POA: Diagnosis not present

## 2022-09-08 DIAGNOSIS — C61 Malignant neoplasm of prostate: Secondary | ICD-10-CM | POA: Diagnosis not present

## 2022-09-08 DIAGNOSIS — Z9181 History of falling: Secondary | ICD-10-CM | POA: Diagnosis not present

## 2022-09-08 DIAGNOSIS — M25562 Pain in left knee: Secondary | ICD-10-CM | POA: Diagnosis not present

## 2022-09-09 DIAGNOSIS — C61 Malignant neoplasm of prostate: Secondary | ICD-10-CM | POA: Diagnosis not present

## 2022-09-09 DIAGNOSIS — Z4789 Encounter for other orthopedic aftercare: Secondary | ICD-10-CM | POA: Diagnosis not present

## 2022-09-09 DIAGNOSIS — M6281 Muscle weakness (generalized): Secondary | ICD-10-CM | POA: Diagnosis not present

## 2022-09-09 DIAGNOSIS — R279 Unspecified lack of coordination: Secondary | ICD-10-CM | POA: Diagnosis not present

## 2022-09-09 DIAGNOSIS — L89623 Pressure ulcer of left heel, stage 3: Secondary | ICD-10-CM | POA: Diagnosis not present

## 2022-09-09 DIAGNOSIS — C3490 Malignant neoplasm of unspecified part of unspecified bronchus or lung: Secondary | ICD-10-CM | POA: Diagnosis not present

## 2022-09-09 DIAGNOSIS — L89152 Pressure ulcer of sacral region, stage 2: Secondary | ICD-10-CM | POA: Diagnosis not present

## 2022-09-09 DIAGNOSIS — R2681 Unsteadiness on feet: Secondary | ICD-10-CM | POA: Diagnosis not present

## 2022-09-09 DIAGNOSIS — S72142D Displaced intertrochanteric fracture of left femur, subsequent encounter for closed fracture with routine healing: Secondary | ICD-10-CM | POA: Diagnosis not present

## 2022-09-09 DIAGNOSIS — M25562 Pain in left knee: Secondary | ICD-10-CM | POA: Diagnosis not present

## 2022-09-09 DIAGNOSIS — Z9181 History of falling: Secondary | ICD-10-CM | POA: Diagnosis not present

## 2022-09-10 DIAGNOSIS — Z4789 Encounter for other orthopedic aftercare: Secondary | ICD-10-CM | POA: Diagnosis not present

## 2022-09-10 DIAGNOSIS — R2681 Unsteadiness on feet: Secondary | ICD-10-CM | POA: Diagnosis not present

## 2022-09-10 DIAGNOSIS — C61 Malignant neoplasm of prostate: Secondary | ICD-10-CM | POA: Diagnosis not present

## 2022-09-10 DIAGNOSIS — S72142D Displaced intertrochanteric fracture of left femur, subsequent encounter for closed fracture with routine healing: Secondary | ICD-10-CM | POA: Diagnosis not present

## 2022-09-10 DIAGNOSIS — R279 Unspecified lack of coordination: Secondary | ICD-10-CM | POA: Diagnosis not present

## 2022-09-10 DIAGNOSIS — Z9181 History of falling: Secondary | ICD-10-CM | POA: Diagnosis not present

## 2022-09-10 DIAGNOSIS — M25562 Pain in left knee: Secondary | ICD-10-CM | POA: Diagnosis not present

## 2022-09-10 DIAGNOSIS — M6281 Muscle weakness (generalized): Secondary | ICD-10-CM | POA: Diagnosis not present

## 2022-09-10 DIAGNOSIS — C3490 Malignant neoplasm of unspecified part of unspecified bronchus or lung: Secondary | ICD-10-CM | POA: Diagnosis not present

## 2022-09-11 DIAGNOSIS — R279 Unspecified lack of coordination: Secondary | ICD-10-CM | POA: Diagnosis not present

## 2022-09-11 DIAGNOSIS — Z9181 History of falling: Secondary | ICD-10-CM | POA: Diagnosis not present

## 2022-09-11 DIAGNOSIS — S72142D Displaced intertrochanteric fracture of left femur, subsequent encounter for closed fracture with routine healing: Secondary | ICD-10-CM | POA: Diagnosis not present

## 2022-09-11 DIAGNOSIS — C61 Malignant neoplasm of prostate: Secondary | ICD-10-CM | POA: Diagnosis not present

## 2022-09-11 DIAGNOSIS — Z4789 Encounter for other orthopedic aftercare: Secondary | ICD-10-CM | POA: Diagnosis not present

## 2022-09-11 DIAGNOSIS — C3490 Malignant neoplasm of unspecified part of unspecified bronchus or lung: Secondary | ICD-10-CM | POA: Diagnosis not present

## 2022-09-11 DIAGNOSIS — M6281 Muscle weakness (generalized): Secondary | ICD-10-CM | POA: Diagnosis not present

## 2022-09-11 DIAGNOSIS — R2681 Unsteadiness on feet: Secondary | ICD-10-CM | POA: Diagnosis not present

## 2022-09-11 DIAGNOSIS — M25562 Pain in left knee: Secondary | ICD-10-CM | POA: Diagnosis not present

## 2022-09-14 ENCOUNTER — Ambulatory Visit (INDEPENDENT_AMBULATORY_CARE_PROVIDER_SITE_OTHER): Payer: Medicare HMO | Admitting: Orthopedic Surgery

## 2022-09-14 ENCOUNTER — Ambulatory Visit (INDEPENDENT_AMBULATORY_CARE_PROVIDER_SITE_OTHER): Payer: Medicare HMO

## 2022-09-14 DIAGNOSIS — S72002D Fracture of unspecified part of neck of left femur, subsequent encounter for closed fracture with routine healing: Secondary | ICD-10-CM

## 2022-09-14 DIAGNOSIS — R2681 Unsteadiness on feet: Secondary | ICD-10-CM | POA: Diagnosis not present

## 2022-09-14 DIAGNOSIS — Z4789 Encounter for other orthopedic aftercare: Secondary | ICD-10-CM | POA: Diagnosis not present

## 2022-09-14 DIAGNOSIS — R279 Unspecified lack of coordination: Secondary | ICD-10-CM | POA: Diagnosis not present

## 2022-09-14 DIAGNOSIS — C3490 Malignant neoplasm of unspecified part of unspecified bronchus or lung: Secondary | ICD-10-CM | POA: Diagnosis not present

## 2022-09-14 DIAGNOSIS — M25562 Pain in left knee: Secondary | ICD-10-CM | POA: Diagnosis not present

## 2022-09-14 DIAGNOSIS — Z9181 History of falling: Secondary | ICD-10-CM | POA: Diagnosis not present

## 2022-09-14 DIAGNOSIS — M6281 Muscle weakness (generalized): Secondary | ICD-10-CM | POA: Diagnosis not present

## 2022-09-14 DIAGNOSIS — C61 Malignant neoplasm of prostate: Secondary | ICD-10-CM | POA: Diagnosis not present

## 2022-09-14 DIAGNOSIS — S72142D Displaced intertrochanteric fracture of left femur, subsequent encounter for closed fracture with routine healing: Secondary | ICD-10-CM | POA: Diagnosis not present

## 2022-09-15 DIAGNOSIS — S72142D Displaced intertrochanteric fracture of left femur, subsequent encounter for closed fracture with routine healing: Secondary | ICD-10-CM | POA: Diagnosis not present

## 2022-09-15 DIAGNOSIS — R279 Unspecified lack of coordination: Secondary | ICD-10-CM | POA: Diagnosis not present

## 2022-09-15 DIAGNOSIS — M25562 Pain in left knee: Secondary | ICD-10-CM | POA: Diagnosis not present

## 2022-09-15 DIAGNOSIS — Z9181 History of falling: Secondary | ICD-10-CM | POA: Diagnosis not present

## 2022-09-15 DIAGNOSIS — C3490 Malignant neoplasm of unspecified part of unspecified bronchus or lung: Secondary | ICD-10-CM | POA: Diagnosis not present

## 2022-09-15 DIAGNOSIS — M6281 Muscle weakness (generalized): Secondary | ICD-10-CM | POA: Diagnosis not present

## 2022-09-15 DIAGNOSIS — Z4789 Encounter for other orthopedic aftercare: Secondary | ICD-10-CM | POA: Diagnosis not present

## 2022-09-15 DIAGNOSIS — R2681 Unsteadiness on feet: Secondary | ICD-10-CM | POA: Diagnosis not present

## 2022-09-15 DIAGNOSIS — C61 Malignant neoplasm of prostate: Secondary | ICD-10-CM | POA: Diagnosis not present

## 2022-09-15 NOTE — Progress Notes (Unsigned)
   Post-Op Visit Note   Patient: John Riley           Date of Birth: Feb 14, 1932           MRN: [id number] Visit Date: 09/14/2022 PCP: Dorothyann Peng, NP   Assessment & Plan:  Chief Complaint:  Chief Complaint  Patient presents with   Left Hip - Routine Post Op   Visit Diagnoses:  1. Closed fracture of left hip with routine healing, subsequent encounter     Plan: [name] is an 89 year old patient who is now about 2 months out left intramedullary nail for high intertrochanteric femur fracture.  Therapy 3-4 times a week.  On examination he has very good hip flexion strength.  Radiographs show healing of the fracture.  Plan is weightbearing as tolerated and continue with physical therapy.  He is having some other medical additions which are delaying his recovery.  Overall the fracture looks healed and he can be weightbearing as tolerated.  Continue with therapy and follow-up with [place] as needed.  Follow-Up Instructions: No follow-ups on file.   Orders:  Orders Placed This Encounter  Procedures   XR FEMUR MIN 2 VIEWS LEFT   No orders of the defined types were placed in this encounter.   Imaging: No results found.  PMFS History: Patient Active Problem List   Diagnosis Date Noted   Acute metabolic encephalopathy 63/81/7711   AKI (acute kidney injury) ([place]) 07/28/2022   Acute respiratory failure with hypoxia ([place]) 07/28/2022   Leukocytosis 07/28/2022   Ileus ([place]) 07/28/2022   Hyperkalemia 65/79/0383   Metabolic acidosis 33/83/2919   Closed fracture of left hip ([place]) 07/18/2022   Femur fracture, left ([employer]) 07/16/2022   Cervical pain 07/16/2022   Impaired glucose tolerance 02/09/2014   Dyspnea 12/13/2012   Prostate cancer ([place]) 08/05/2012   Non-small cell lung cancer, Rt middle Lobe    INSOMNIA 12/07/2007   Dyslipidemia 06/28/2007   Essential hypertension 06/28/2007   Past Medical History:  Diagnosis Date   ELEVATED PROSTATE SPECIFIC ANTIGEN 07/31/2009    HYPERLIPIDEMIA 06/28/2007   HYPERTENSION 06/28/2007   INSOMNIA 12/07/2007   Non-small cell lung cancer ([place]) 2009   PULMONARY NODULE, RIGHT MIDDLE LOBE 12/07/2007   TOBACCO ABUSE 11/09/2007    No family history on file.  Past Surgical History:  Procedure Laterality Date   APPENDECTOMY     CATARACT EXTRACTION Left 10/30/2021   INTRAMEDULLARY (IM) NAIL INTERTROCHANTERIC Left 07/17/2022   Procedure: INTRAMEDULLARY (IM) NAIL INTERTROCHANTERIC;  Surgeon: Meredith Pel, MD;  Location: [place];  Service: Orthopedics;  Laterality: Left;   LOBECTOMY     right middle   PROSTATE SURGERY     bx   VEIN LIGATION AND STRIPPING Left 1974   Social History   Occupational History   Not on file  Tobacco Use   Smoking status: Former    Types: Cigarettes    Quit date: 08/18/2007    Years since quitting: 15.0   Smokeless tobacco: Never  Substance and Sexual Activity   Alcohol use: No   Drug use: No   Sexual activity: Not on file

## 2022-09-16 DIAGNOSIS — L89623 Pressure ulcer of left heel, stage 3: Secondary | ICD-10-CM | POA: Diagnosis not present

## 2022-09-16 DIAGNOSIS — Z9181 History of falling: Secondary | ICD-10-CM | POA: Diagnosis not present

## 2022-09-16 DIAGNOSIS — M25562 Pain in left knee: Secondary | ICD-10-CM | POA: Diagnosis not present

## 2022-09-16 DIAGNOSIS — R2681 Unsteadiness on feet: Secondary | ICD-10-CM | POA: Diagnosis not present

## 2022-09-16 DIAGNOSIS — C61 Malignant neoplasm of prostate: Secondary | ICD-10-CM | POA: Diagnosis not present

## 2022-09-16 DIAGNOSIS — M6281 Muscle weakness (generalized): Secondary | ICD-10-CM | POA: Diagnosis not present

## 2022-09-16 DIAGNOSIS — Z4789 Encounter for other orthopedic aftercare: Secondary | ICD-10-CM | POA: Diagnosis not present

## 2022-09-16 DIAGNOSIS — S72142D Displaced intertrochanteric fracture of left femur, subsequent encounter for closed fracture with routine healing: Secondary | ICD-10-CM | POA: Diagnosis not present

## 2022-09-16 DIAGNOSIS — L89152 Pressure ulcer of sacral region, stage 2: Secondary | ICD-10-CM | POA: Diagnosis not present

## 2022-09-16 DIAGNOSIS — R279 Unspecified lack of coordination: Secondary | ICD-10-CM | POA: Diagnosis not present

## 2022-09-16 DIAGNOSIS — L89896 Pressure-induced deep tissue damage of other site: Secondary | ICD-10-CM | POA: Diagnosis not present

## 2022-09-16 DIAGNOSIS — C3490 Malignant neoplasm of unspecified part of unspecified bronchus or lung: Secondary | ICD-10-CM | POA: Diagnosis not present

## 2022-09-17 ENCOUNTER — Encounter: Payer: Self-pay | Admitting: Orthopedic Surgery

## 2022-09-17 DIAGNOSIS — R338 Other retention of urine: Secondary | ICD-10-CM | POA: Diagnosis not present

## 2022-09-17 DIAGNOSIS — N179 Acute kidney failure, unspecified: Secondary | ICD-10-CM | POA: Diagnosis not present

## 2022-09-17 DIAGNOSIS — Z9181 History of falling: Secondary | ICD-10-CM | POA: Diagnosis not present

## 2022-09-17 DIAGNOSIS — R2681 Unsteadiness on feet: Secondary | ICD-10-CM | POA: Diagnosis not present

## 2022-09-17 DIAGNOSIS — R278 Other lack of coordination: Secondary | ICD-10-CM | POA: Diagnosis not present

## 2022-09-17 DIAGNOSIS — C3490 Malignant neoplasm of unspecified part of unspecified bronchus or lung: Secondary | ICD-10-CM | POA: Diagnosis not present

## 2022-09-17 DIAGNOSIS — C61 Malignant neoplasm of prostate: Secondary | ICD-10-CM | POA: Diagnosis not present

## 2022-09-17 DIAGNOSIS — R279 Unspecified lack of coordination: Secondary | ICD-10-CM | POA: Diagnosis not present

## 2022-09-17 DIAGNOSIS — M6281 Muscle weakness (generalized): Secondary | ICD-10-CM | POA: Diagnosis not present

## 2022-09-17 DIAGNOSIS — M25562 Pain in left knee: Secondary | ICD-10-CM | POA: Diagnosis not present

## 2022-09-19 DIAGNOSIS — A0472 Enterocolitis due to Clostridium difficile, not specified as recurrent: Secondary | ICD-10-CM | POA: Diagnosis not present

## 2022-09-22 DIAGNOSIS — R2681 Unsteadiness on feet: Secondary | ICD-10-CM | POA: Diagnosis not present

## 2022-09-22 DIAGNOSIS — C3490 Malignant neoplasm of unspecified part of unspecified bronchus or lung: Secondary | ICD-10-CM | POA: Diagnosis not present

## 2022-09-22 DIAGNOSIS — M25562 Pain in left knee: Secondary | ICD-10-CM | POA: Diagnosis not present

## 2022-09-22 DIAGNOSIS — R279 Unspecified lack of coordination: Secondary | ICD-10-CM | POA: Diagnosis not present

## 2022-09-22 DIAGNOSIS — R278 Other lack of coordination: Secondary | ICD-10-CM | POA: Diagnosis not present

## 2022-09-22 DIAGNOSIS — C61 Malignant neoplasm of prostate: Secondary | ICD-10-CM | POA: Diagnosis not present

## 2022-09-22 DIAGNOSIS — M6281 Muscle weakness (generalized): Secondary | ICD-10-CM | POA: Diagnosis not present

## 2022-09-22 DIAGNOSIS — Z9181 History of falling: Secondary | ICD-10-CM | POA: Diagnosis not present

## 2022-09-22 DIAGNOSIS — N179 Acute kidney failure, unspecified: Secondary | ICD-10-CM | POA: Diagnosis not present

## 2022-09-23 DIAGNOSIS — R2681 Unsteadiness on feet: Secondary | ICD-10-CM | POA: Diagnosis not present

## 2022-09-23 DIAGNOSIS — R278 Other lack of coordination: Secondary | ICD-10-CM | POA: Diagnosis not present

## 2022-09-23 DIAGNOSIS — L89896 Pressure-induced deep tissue damage of other site: Secondary | ICD-10-CM | POA: Diagnosis not present

## 2022-09-23 DIAGNOSIS — C61 Malignant neoplasm of prostate: Secondary | ICD-10-CM | POA: Diagnosis not present

## 2022-09-23 DIAGNOSIS — C3490 Malignant neoplasm of unspecified part of unspecified bronchus or lung: Secondary | ICD-10-CM | POA: Diagnosis not present

## 2022-09-23 DIAGNOSIS — Z9181 History of falling: Secondary | ICD-10-CM | POA: Diagnosis not present

## 2022-09-23 DIAGNOSIS — L89623 Pressure ulcer of left heel, stage 3: Secondary | ICD-10-CM | POA: Diagnosis not present

## 2022-09-23 DIAGNOSIS — M6281 Muscle weakness (generalized): Secondary | ICD-10-CM | POA: Diagnosis not present

## 2022-09-23 DIAGNOSIS — R279 Unspecified lack of coordination: Secondary | ICD-10-CM | POA: Diagnosis not present

## 2022-09-23 DIAGNOSIS — M25562 Pain in left knee: Secondary | ICD-10-CM | POA: Diagnosis not present

## 2022-09-23 DIAGNOSIS — N179 Acute kidney failure, unspecified: Secondary | ICD-10-CM | POA: Diagnosis not present

## 2022-09-23 DIAGNOSIS — L89152 Pressure ulcer of sacral region, stage 2: Secondary | ICD-10-CM | POA: Diagnosis not present

## 2022-09-30 DIAGNOSIS — L89896 Pressure-induced deep tissue damage of other site: Secondary | ICD-10-CM | POA: Diagnosis not present

## 2022-09-30 DIAGNOSIS — L89152 Pressure ulcer of sacral region, stage 2: Secondary | ICD-10-CM | POA: Diagnosis not present

## 2022-09-30 DIAGNOSIS — L89623 Pressure ulcer of left heel, stage 3: Secondary | ICD-10-CM | POA: Diagnosis not present

## 2022-10-07 DIAGNOSIS — L89896 Pressure-induced deep tissue damage of other site: Secondary | ICD-10-CM | POA: Diagnosis not present

## 2022-10-07 DIAGNOSIS — L89152 Pressure ulcer of sacral region, stage 2: Secondary | ICD-10-CM | POA: Diagnosis not present

## 2022-10-07 DIAGNOSIS — L89623 Pressure ulcer of left heel, stage 3: Secondary | ICD-10-CM | POA: Diagnosis not present

## 2022-10-08 DIAGNOSIS — I5032 Chronic diastolic (congestive) heart failure: Secondary | ICD-10-CM | POA: Diagnosis not present

## 2022-10-08 DIAGNOSIS — Z79899 Other long term (current) drug therapy: Secondary | ICD-10-CM | POA: Diagnosis not present

## 2022-10-08 DIAGNOSIS — D508 Other iron deficiency anemias: Secondary | ICD-10-CM | POA: Diagnosis not present

## 2022-10-08 DIAGNOSIS — R69 Illness, unspecified: Secondary | ICD-10-CM | POA: Diagnosis not present

## 2022-10-08 DIAGNOSIS — A0472 Enterocolitis due to Clostridium difficile, not specified as recurrent: Secondary | ICD-10-CM | POA: Diagnosis not present

## 2022-10-11 DIAGNOSIS — E46 Unspecified protein-calorie malnutrition: Secondary | ICD-10-CM | POA: Diagnosis not present

## 2022-10-11 DIAGNOSIS — R059 Cough, unspecified: Secondary | ICD-10-CM | POA: Diagnosis not present

## 2022-10-11 DIAGNOSIS — R197 Diarrhea, unspecified: Secondary | ICD-10-CM | POA: Diagnosis not present

## 2022-10-12 DIAGNOSIS — Z79899 Other long term (current) drug therapy: Secondary | ICD-10-CM | POA: Diagnosis not present

## 2022-10-12 DIAGNOSIS — I5032 Chronic diastolic (congestive) heart failure: Secondary | ICD-10-CM | POA: Diagnosis not present

## 2022-10-12 DIAGNOSIS — A0472 Enterocolitis due to Clostridium difficile, not specified as recurrent: Secondary | ICD-10-CM | POA: Diagnosis not present

## 2022-10-14 DIAGNOSIS — L89623 Pressure ulcer of left heel, stage 3: Secondary | ICD-10-CM | POA: Diagnosis not present

## 2022-10-14 DIAGNOSIS — L89152 Pressure ulcer of sacral region, stage 2: Secondary | ICD-10-CM | POA: Diagnosis not present

## 2022-10-14 DIAGNOSIS — L89896 Pressure-induced deep tissue damage of other site: Secondary | ICD-10-CM | POA: Diagnosis not present

## 2022-10-21 DIAGNOSIS — L89623 Pressure ulcer of left heel, stage 3: Secondary | ICD-10-CM | POA: Diagnosis not present

## 2022-10-21 DIAGNOSIS — L89896 Pressure-induced deep tissue damage of other site: Secondary | ICD-10-CM | POA: Diagnosis not present

## 2022-10-28 DIAGNOSIS — L89623 Pressure ulcer of left heel, stage 3: Secondary | ICD-10-CM | POA: Diagnosis not present

## 2022-10-28 DIAGNOSIS — L89896 Pressure-induced deep tissue damage of other site: Secondary | ICD-10-CM | POA: Diagnosis not present

## 2022-11-04 DIAGNOSIS — L89623 Pressure ulcer of left heel, stage 3: Secondary | ICD-10-CM | POA: Diagnosis not present

## 2022-11-04 DIAGNOSIS — L89896 Pressure-induced deep tissue damage of other site: Secondary | ICD-10-CM | POA: Diagnosis not present

## 2022-11-11 DIAGNOSIS — L89623 Pressure ulcer of left heel, stage 3: Secondary | ICD-10-CM | POA: Diagnosis not present

## 2022-11-11 DIAGNOSIS — L89892 Pressure ulcer of other site, stage 2: Secondary | ICD-10-CM | POA: Diagnosis not present

## 2022-11-12 DIAGNOSIS — J811 Chronic pulmonary edema: Secondary | ICD-10-CM | POA: Diagnosis not present

## 2022-11-12 DIAGNOSIS — A0472 Enterocolitis due to Clostridium difficile, not specified as recurrent: Secondary | ICD-10-CM | POA: Diagnosis not present

## 2022-11-12 DIAGNOSIS — Z79899 Other long term (current) drug therapy: Secondary | ICD-10-CM | POA: Diagnosis not present

## 2022-11-12 DIAGNOSIS — I5032 Chronic diastolic (congestive) heart failure: Secondary | ICD-10-CM | POA: Diagnosis not present

## 2022-11-12 DIAGNOSIS — D508 Other iron deficiency anemias: Secondary | ICD-10-CM | POA: Diagnosis not present

## 2022-11-13 DIAGNOSIS — Z79899 Other long term (current) drug therapy: Secondary | ICD-10-CM | POA: Diagnosis not present

## 2022-11-17 DIAGNOSIS — D508 Other iron deficiency anemias: Secondary | ICD-10-CM | POA: Diagnosis not present

## 2022-11-17 DIAGNOSIS — I5032 Chronic diastolic (congestive) heart failure: Secondary | ICD-10-CM | POA: Diagnosis not present

## 2022-11-17 DIAGNOSIS — A0472 Enterocolitis due to Clostridium difficile, not specified as recurrent: Secondary | ICD-10-CM | POA: Diagnosis not present

## 2022-11-17 DIAGNOSIS — Z79899 Other long term (current) drug therapy: Secondary | ICD-10-CM | POA: Diagnosis not present

## 2022-11-18 DIAGNOSIS — L89623 Pressure ulcer of left heel, stage 3: Secondary | ICD-10-CM | POA: Diagnosis not present

## 2022-11-18 DIAGNOSIS — L89892 Pressure ulcer of other site, stage 2: Secondary | ICD-10-CM | POA: Diagnosis not present

## 2022-11-20 DIAGNOSIS — D508 Other iron deficiency anemias: Secondary | ICD-10-CM | POA: Diagnosis not present

## 2022-11-20 DIAGNOSIS — A0472 Enterocolitis due to Clostridium difficile, not specified as recurrent: Secondary | ICD-10-CM | POA: Diagnosis not present

## 2022-11-20 DIAGNOSIS — I5032 Chronic diastolic (congestive) heart failure: Secondary | ICD-10-CM | POA: Diagnosis not present

## 2022-11-20 DIAGNOSIS — R69 Illness, unspecified: Secondary | ICD-10-CM | POA: Diagnosis not present

## 2022-11-21 DIAGNOSIS — D508 Other iron deficiency anemias: Secondary | ICD-10-CM | POA: Diagnosis not present

## 2022-11-21 DIAGNOSIS — A0472 Enterocolitis due to Clostridium difficile, not specified as recurrent: Secondary | ICD-10-CM | POA: Diagnosis not present

## 2022-11-21 DIAGNOSIS — R69 Illness, unspecified: Secondary | ICD-10-CM | POA: Diagnosis not present

## 2022-11-21 DIAGNOSIS — I5032 Chronic diastolic (congestive) heart failure: Secondary | ICD-10-CM | POA: Diagnosis not present

## 2022-11-22 DIAGNOSIS — R109 Unspecified abdominal pain: Secondary | ICD-10-CM | POA: Diagnosis not present

## 2022-11-22 DIAGNOSIS — R310 Gross hematuria: Secondary | ICD-10-CM | POA: Diagnosis not present

## 2022-11-22 DIAGNOSIS — R197 Diarrhea, unspecified: Secondary | ICD-10-CM | POA: Diagnosis not present

## 2022-11-23 DIAGNOSIS — I5032 Chronic diastolic (congestive) heart failure: Secondary | ICD-10-CM | POA: Diagnosis not present

## 2022-11-23 DIAGNOSIS — R69 Illness, unspecified: Secondary | ICD-10-CM | POA: Diagnosis not present

## 2022-11-23 DIAGNOSIS — A0472 Enterocolitis due to Clostridium difficile, not specified as recurrent: Secondary | ICD-10-CM | POA: Diagnosis not present

## 2022-11-23 DIAGNOSIS — D508 Other iron deficiency anemias: Secondary | ICD-10-CM | POA: Diagnosis not present

## 2022-11-23 DIAGNOSIS — Z79899 Other long term (current) drug therapy: Secondary | ICD-10-CM | POA: Diagnosis not present

## 2022-11-23 DIAGNOSIS — R109 Unspecified abdominal pain: Secondary | ICD-10-CM | POA: Diagnosis not present

## 2022-11-25 DIAGNOSIS — L89892 Pressure ulcer of other site, stage 2: Secondary | ICD-10-CM | POA: Diagnosis not present

## 2022-11-25 DIAGNOSIS — L89310 Pressure ulcer of right buttock, unstageable: Secondary | ICD-10-CM | POA: Diagnosis not present

## 2022-11-25 DIAGNOSIS — L89152 Pressure ulcer of sacral region, stage 2: Secondary | ICD-10-CM | POA: Diagnosis not present

## 2022-11-25 DIAGNOSIS — L89623 Pressure ulcer of left heel, stage 3: Secondary | ICD-10-CM | POA: Diagnosis not present

## 2022-12-02 DIAGNOSIS — L89623 Pressure ulcer of left heel, stage 3: Secondary | ICD-10-CM | POA: Diagnosis not present

## 2022-12-02 DIAGNOSIS — L89892 Pressure ulcer of other site, stage 2: Secondary | ICD-10-CM | POA: Diagnosis not present

## 2022-12-02 DIAGNOSIS — L89153 Pressure ulcer of sacral region, stage 3: Secondary | ICD-10-CM | POA: Diagnosis not present

## 2022-12-02 DIAGNOSIS — L89310 Pressure ulcer of right buttock, unstageable: Secondary | ICD-10-CM | POA: Diagnosis not present

## 2022-12-09 DIAGNOSIS — L89623 Pressure ulcer of left heel, stage 3: Secondary | ICD-10-CM | POA: Diagnosis not present

## 2022-12-09 DIAGNOSIS — L89153 Pressure ulcer of sacral region, stage 3: Secondary | ICD-10-CM | POA: Diagnosis not present

## 2022-12-09 DIAGNOSIS — L89310 Pressure ulcer of right buttock, unstageable: Secondary | ICD-10-CM | POA: Diagnosis not present

## 2022-12-16 DIAGNOSIS — L89623 Pressure ulcer of left heel, stage 3: Secondary | ICD-10-CM | POA: Diagnosis not present

## 2022-12-16 DIAGNOSIS — L89313 Pressure ulcer of right buttock, stage 3: Secondary | ICD-10-CM | POA: Diagnosis not present

## 2022-12-16 DIAGNOSIS — L89153 Pressure ulcer of sacral region, stage 3: Secondary | ICD-10-CM | POA: Diagnosis not present

## 2022-12-17 DIAGNOSIS — R69 Illness, unspecified: Secondary | ICD-10-CM | POA: Diagnosis not present

## 2022-12-17 DIAGNOSIS — Z79899 Other long term (current) drug therapy: Secondary | ICD-10-CM | POA: Diagnosis not present

## 2022-12-17 DIAGNOSIS — D508 Other iron deficiency anemias: Secondary | ICD-10-CM | POA: Diagnosis not present

## 2022-12-17 DIAGNOSIS — I5032 Chronic diastolic (congestive) heart failure: Secondary | ICD-10-CM | POA: Diagnosis not present

## 2022-12-17 DIAGNOSIS — A0472 Enterocolitis due to Clostridium difficile, not specified as recurrent: Secondary | ICD-10-CM | POA: Diagnosis not present

## 2022-12-23 DIAGNOSIS — A0472 Enterocolitis due to Clostridium difficile, not specified as recurrent: Secondary | ICD-10-CM | POA: Diagnosis not present

## 2022-12-23 DIAGNOSIS — D508 Other iron deficiency anemias: Secondary | ICD-10-CM | POA: Diagnosis not present

## 2022-12-23 DIAGNOSIS — I5032 Chronic diastolic (congestive) heart failure: Secondary | ICD-10-CM | POA: Diagnosis not present

## 2022-12-23 DIAGNOSIS — R69 Illness, unspecified: Secondary | ICD-10-CM | POA: Diagnosis not present

## 2022-12-23 DIAGNOSIS — L89313 Pressure ulcer of right buttock, stage 3: Secondary | ICD-10-CM | POA: Diagnosis not present

## 2022-12-23 DIAGNOSIS — L89153 Pressure ulcer of sacral region, stage 3: Secondary | ICD-10-CM | POA: Diagnosis not present

## 2022-12-24 DIAGNOSIS — R69 Illness, unspecified: Secondary | ICD-10-CM | POA: Diagnosis not present

## 2022-12-24 DIAGNOSIS — A0472 Enterocolitis due to Clostridium difficile, not specified as recurrent: Secondary | ICD-10-CM | POA: Diagnosis not present

## 2022-12-24 DIAGNOSIS — D508 Other iron deficiency anemias: Secondary | ICD-10-CM | POA: Diagnosis not present

## 2022-12-24 DIAGNOSIS — Z79899 Other long term (current) drug therapy: Secondary | ICD-10-CM | POA: Diagnosis not present

## 2022-12-24 DIAGNOSIS — I5032 Chronic diastolic (congestive) heart failure: Secondary | ICD-10-CM | POA: Diagnosis not present

## 2022-12-30 DIAGNOSIS — L89313 Pressure ulcer of right buttock, stage 3: Secondary | ICD-10-CM | POA: Diagnosis not present

## 2022-12-30 DIAGNOSIS — L89153 Pressure ulcer of sacral region, stage 3: Secondary | ICD-10-CM | POA: Diagnosis not present

## 2023-01-05 DIAGNOSIS — N390 Urinary tract infection, site not specified: Secondary | ICD-10-CM | POA: Diagnosis not present

## 2023-01-06 DIAGNOSIS — L89153 Pressure ulcer of sacral region, stage 3: Secondary | ICD-10-CM | POA: Diagnosis not present

## 2023-01-06 DIAGNOSIS — L89313 Pressure ulcer of right buttock, stage 3: Secondary | ICD-10-CM | POA: Diagnosis not present

## 2023-01-13 DIAGNOSIS — L89153 Pressure ulcer of sacral region, stage 3: Secondary | ICD-10-CM | POA: Diagnosis not present

## 2023-01-20 DIAGNOSIS — L89153 Pressure ulcer of sacral region, stage 3: Secondary | ICD-10-CM | POA: Diagnosis not present

## 2023-01-27 DIAGNOSIS — L89153 Pressure ulcer of sacral region, stage 3: Secondary | ICD-10-CM | POA: Diagnosis not present

## 2023-02-03 DIAGNOSIS — L89153 Pressure ulcer of sacral region, stage 3: Secondary | ICD-10-CM | POA: Diagnosis not present

## 2023-02-10 DIAGNOSIS — L89153 Pressure ulcer of sacral region, stage 3: Secondary | ICD-10-CM | POA: Diagnosis not present

## 2023-02-14 DIAGNOSIS — E46 Unspecified protein-calorie malnutrition: Secondary | ICD-10-CM | POA: Diagnosis not present

## 2023-02-14 DIAGNOSIS — R197 Diarrhea, unspecified: Secondary | ICD-10-CM | POA: Diagnosis not present

## 2023-02-14 DIAGNOSIS — E876 Hypokalemia: Secondary | ICD-10-CM | POA: Diagnosis not present

## 2023-02-14 DIAGNOSIS — L89151 Pressure ulcer of sacral region, stage 1: Secondary | ICD-10-CM | POA: Diagnosis not present

## 2023-02-15 DIAGNOSIS — Z79899 Other long term (current) drug therapy: Secondary | ICD-10-CM | POA: Diagnosis not present

## 2023-02-15 DIAGNOSIS — F29 Unspecified psychosis not due to a substance or known physiological condition: Secondary | ICD-10-CM | POA: Diagnosis not present

## 2023-02-15 DIAGNOSIS — F32A Depression, unspecified: Secondary | ICD-10-CM | POA: Diagnosis not present

## 2023-02-15 DIAGNOSIS — A0472 Enterocolitis due to Clostridium difficile, not specified as recurrent: Secondary | ICD-10-CM | POA: Diagnosis not present

## 2023-02-17 DIAGNOSIS — L89153 Pressure ulcer of sacral region, stage 3: Secondary | ICD-10-CM | POA: Diagnosis not present

## 2023-02-22 DIAGNOSIS — I739 Peripheral vascular disease, unspecified: Secondary | ICD-10-CM | POA: Diagnosis not present

## 2023-02-22 DIAGNOSIS — L84 Corns and callosities: Secondary | ICD-10-CM | POA: Diagnosis not present

## 2023-02-22 DIAGNOSIS — L602 Onychogryphosis: Secondary | ICD-10-CM | POA: Diagnosis not present

## 2023-02-22 DIAGNOSIS — L603 Nail dystrophy: Secondary | ICD-10-CM | POA: Diagnosis not present

## 2023-02-24 DIAGNOSIS — L89153 Pressure ulcer of sacral region, stage 3: Secondary | ICD-10-CM | POA: Diagnosis not present

## 2023-02-26 DIAGNOSIS — Z79899 Other long term (current) drug therapy: Secondary | ICD-10-CM | POA: Diagnosis not present

## 2023-03-01 DIAGNOSIS — F32A Depression, unspecified: Secondary | ICD-10-CM | POA: Diagnosis not present

## 2023-03-01 DIAGNOSIS — F29 Unspecified psychosis not due to a substance or known physiological condition: Secondary | ICD-10-CM | POA: Diagnosis not present

## 2023-03-03 DIAGNOSIS — L89153 Pressure ulcer of sacral region, stage 3: Secondary | ICD-10-CM | POA: Diagnosis not present

## 2023-03-04 DIAGNOSIS — M533 Sacrococcygeal disorders, not elsewhere classified: Secondary | ICD-10-CM | POA: Diagnosis not present

## 2023-03-10 DIAGNOSIS — L89153 Pressure ulcer of sacral region, stage 3: Secondary | ICD-10-CM | POA: Diagnosis not present

## 2023-03-17 DIAGNOSIS — L89153 Pressure ulcer of sacral region, stage 3: Secondary | ICD-10-CM | POA: Diagnosis not present

## 2023-03-24 DIAGNOSIS — L89153 Pressure ulcer of sacral region, stage 3: Secondary | ICD-10-CM | POA: Diagnosis not present

## 2023-04-18 DEATH — deceased

## 2023-09-07 ENCOUNTER — Telehealth: Payer: Self-pay | Admitting: Adult Health

## 2023-09-07 NOTE — Telephone Encounter (Signed)
Lmom for pt to sch cpe or ov last seen over a year ago
